# Patient Record
Sex: Male | Born: 1955
Health system: Southern US, Community
[De-identification: ages and names within clinical notes are randomized; demographics above are authoritative.]

## PROBLEM LIST (undated history)

## (undated) DIAGNOSIS — I639 Cerebral infarction, unspecified: Secondary | ICD-10-CM

## (undated) DIAGNOSIS — I4891 Unspecified atrial fibrillation: Secondary | ICD-10-CM

## (undated) DIAGNOSIS — F419 Anxiety disorder, unspecified: Secondary | ICD-10-CM

## (undated) DIAGNOSIS — E785 Hyperlipidemia, unspecified: Secondary | ICD-10-CM

## (undated) DIAGNOSIS — K219 Gastro-esophageal reflux disease without esophagitis: Secondary | ICD-10-CM

## (undated) DIAGNOSIS — N400 Enlarged prostate without lower urinary tract symptoms: Secondary | ICD-10-CM

## (undated) HISTORY — DX: Gastro-esophageal reflux disease without esophagitis: K21.9

## (undated) HISTORY — PX: RADIAL KERATOTOMY: SHX217

## (undated) HISTORY — DX: Benign prostatic hyperplasia without lower urinary tract symptoms: N40.0

## (undated) HISTORY — PX: OTHER SURGICAL HISTORY: SHX169

## (undated) HISTORY — PX: APPENDECTOMY: SHX54

## (undated) HISTORY — DX: Hyperlipidemia, unspecified: E78.5

## (undated) HISTORY — DX: Cerebral infarction, unspecified: I63.9

## (undated) HISTORY — PX: CYSTOSCOPY: SUR368

## (undated) HISTORY — DX: Unspecified atrial fibrillation: I48.91

## (undated) HISTORY — PX: COLONOSCOPY: SHX174

## (undated) HISTORY — PX: TONSILLECTOMY: SUR1361

---

## 2000-04-25 ENCOUNTER — Ambulatory Visit (HOSPITAL_BASED_OUTPATIENT_CLINIC_OR_DEPARTMENT_OTHER): Admission: RE | Admit: 2000-04-25 | Discharge: 2000-04-25 | Payer: Self-pay | Admitting: *Deleted

## 2002-05-19 HISTORY — PX: PALATE / UVULA BIOPSY / EXCISION: SUR128

## 2004-05-28 ENCOUNTER — Ambulatory Visit: Payer: Self-pay | Admitting: Family Medicine

## 2004-06-04 ENCOUNTER — Ambulatory Visit: Payer: Self-pay | Admitting: Family Medicine

## 2004-09-17 ENCOUNTER — Ambulatory Visit: Payer: Self-pay | Admitting: Family Medicine

## 2005-07-10 ENCOUNTER — Ambulatory Visit: Payer: Self-pay | Admitting: Family Medicine

## 2005-07-17 ENCOUNTER — Ambulatory Visit: Payer: Self-pay | Admitting: Family Medicine

## 2006-02-10 ENCOUNTER — Ambulatory Visit: Payer: Self-pay | Admitting: Family Medicine

## 2006-05-19 HISTORY — PX: HERNIA REPAIR: SHX51

## 2006-07-20 ENCOUNTER — Ambulatory Visit: Payer: Self-pay | Admitting: Family Medicine

## 2006-07-20 LAB — CONVERTED CEMR LAB
BUN: 16 mg/dL (ref 6–23)
Basophils Relative: 0.9 % (ref 0.0–1.0)
Bilirubin, Direct: 0.1 mg/dL (ref 0.0–0.3)
CO2: 30 meq/L (ref 19–32)
Cholesterol: 146 mg/dL (ref 0–200)
Creatinine, Ser: 0.9 mg/dL (ref 0.4–1.5)
Eosinophils Relative: 2 % (ref 0.0–5.0)
GFR calc Af Amer: 114 mL/min
Glucose, Bld: 108 mg/dL — ABNORMAL HIGH (ref 70–99)
HCT: 39.8 % (ref 39.0–52.0)
HDL: 52.7 mg/dL (ref >39.0)
Hemoglobin: 13.9 g/dL (ref 13.0–17.0)
Hgb A1c MFr Bld: 5 % (ref 4.6–6.0)
Lymphocytes Relative: 30 % (ref 12.0–46.0)
Monocytes Absolute: 0.5 10*3/uL (ref 0.2–0.7)
Monocytes Relative: 8.3 % (ref 3.0–11.0)
Neutro Abs: 3.2 10*3/uL (ref 1.4–7.7)
Neutrophils Relative %: 58.8 % (ref 43.0–77.0)
PSA: 0.55 ng/mL (ref 0.10–4.00)
Potassium: 4.5 meq/L (ref 3.5–5.1)
Sodium: 145 meq/L (ref 135–145)
TSH: 2.14 microintl units/mL (ref 0.35–5.50)
Total Bilirubin: 0.9 mg/dL (ref 0.3–1.2)
Total Protein: 6.6 g/dL (ref 6.0–8.3)
VLDL: 14 mg/dL (ref 0–40)

## 2006-07-27 ENCOUNTER — Ambulatory Visit: Payer: Self-pay | Admitting: Family Medicine

## 2006-09-01 ENCOUNTER — Ambulatory Visit: Payer: Self-pay | Admitting: Family Medicine

## 2006-09-21 ENCOUNTER — Encounter: Admission: RE | Admit: 2006-09-21 | Discharge: 2006-09-21 | Payer: Self-pay | Admitting: General Surgery

## 2007-01-11 DIAGNOSIS — N400 Enlarged prostate without lower urinary tract symptoms: Secondary | ICD-10-CM | POA: Insufficient documentation

## 2007-01-11 DIAGNOSIS — R519 Headache, unspecified: Secondary | ICD-10-CM | POA: Insufficient documentation

## 2007-01-11 DIAGNOSIS — E785 Hyperlipidemia, unspecified: Secondary | ICD-10-CM

## 2007-01-11 DIAGNOSIS — K219 Gastro-esophageal reflux disease without esophagitis: Secondary | ICD-10-CM

## 2007-01-11 DIAGNOSIS — R51 Headache: Secondary | ICD-10-CM

## 2007-10-14 ENCOUNTER — Telehealth: Payer: Self-pay | Admitting: Family Medicine

## 2007-10-19 ENCOUNTER — Ambulatory Visit: Payer: Self-pay | Admitting: Family Medicine

## 2007-10-20 LAB — CONVERTED CEMR LAB
AST: 25 units/L (ref 0–37)
Albumin: 4.4 g/dL (ref 3.5–5.2)
Alkaline Phosphatase: 43 units/L (ref 39–117)
BUN: 11 mg/dL (ref 6–23)
Basophils Relative: 0.9 % (ref 0.0–1.0)
Chloride: 101 meq/L (ref 96–112)
Creatinine, Ser: 0.9 mg/dL (ref 0.4–1.5)
Eosinophils Absolute: 0.1 10*3/uL (ref 0.0–0.7)
Eosinophils Relative: 1.8 % (ref 0.0–5.0)
GFR calc Af Amer: 114 mL/min
GFR calc non Af Amer: 94 mL/min
Glucose, Bld: 108 mg/dL — ABNORMAL HIGH (ref 70–99)
HCT: 40.8 % (ref 39.0–52.0)
MCV: 96.6 fL (ref 78.0–100.0)
Monocytes Absolute: 0.4 10*3/uL (ref 0.1–1.0)
Monocytes Relative: 9.3 % (ref 3.0–12.0)
PSA: 0.5 ng/mL (ref 0.10–4.00)
Potassium: 5.2 meq/L — ABNORMAL HIGH (ref 3.5–5.1)
RBC: 4.23 M/uL (ref 4.22–5.81)
Total CHOL/HDL Ratio: 3.1
Total Protein: 7.1 g/dL (ref 6.0–8.3)
WBC: 4.5 10*3/uL (ref 4.5–10.5)

## 2007-10-26 ENCOUNTER — Ambulatory Visit: Payer: Self-pay | Admitting: Family Medicine

## 2009-01-30 ENCOUNTER — Ambulatory Visit: Payer: Self-pay | Admitting: Family Medicine

## 2009-01-31 LAB — CONVERTED CEMR LAB
ALT: 27 units/L (ref 0–53)
Albumin: 4.6 g/dL (ref 3.5–5.2)
BUN: 15 mg/dL (ref 6–23)
Basophils Relative: 0.6 % (ref 0.0–3.0)
Bilirubin Urine: NEGATIVE
Bilirubin, Direct: 0.2 mg/dL (ref 0.0–0.3)
Calcium: 9.6 mg/dL (ref 8.4–10.5)
Cholesterol: 172 mg/dL (ref 0–200)
Eosinophils Absolute: 0.2 10*3/uL (ref 0.0–0.7)
Eosinophils Relative: 3.6 % (ref 0.0–5.0)
GFR calc non Af Amer: 93.13 mL/min (ref >60)
Glucose, Bld: 113 mg/dL — ABNORMAL HIGH (ref 70–99)
HCT: 44.4 % (ref 39.0–52.0)
Hemoglobin, Urine: NEGATIVE
Hemoglobin: 15.3 g/dL (ref 13.0–17.0)
Ketones, ur: NEGATIVE mg/dL
Leukocytes, UA: NEGATIVE
Lymphs Abs: 2 10*3/uL (ref 0.7–4.0)
MCHC: 34.4 g/dL (ref 30.0–36.0)
MCV: 99.2 fL (ref 78.0–100.0)
Monocytes Absolute: 0.4 10*3/uL (ref 0.1–1.0)
Neutro Abs: 3.3 10*3/uL (ref 1.4–7.7)
Neutrophils Relative %: 54.2 % (ref 43.0–77.0)
PSA: 0.57 ng/mL (ref 0.10–4.00)
Potassium: 4.6 meq/L (ref 3.5–5.1)
RBC: 4.48 M/uL (ref 4.22–5.81)
Total Protein: 7.5 g/dL (ref 6.0–8.3)
WBC: 5.9 10*3/uL (ref 4.5–10.5)
pH: 8 (ref 5.0–8.0)

## 2009-02-06 ENCOUNTER — Ambulatory Visit: Payer: Self-pay | Admitting: Family Medicine

## 2009-08-17 HISTORY — PX: OTHER SURGICAL HISTORY: SHX169

## 2009-11-08 ENCOUNTER — Ambulatory Visit: Payer: Self-pay | Admitting: Family Medicine

## 2009-11-09 ENCOUNTER — Telehealth: Payer: Self-pay | Admitting: Family Medicine

## 2009-11-09 LAB — CONVERTED CEMR LAB
HDL: 59.2 mg/dL (ref >39.00)
Total CHOL/HDL Ratio: 3
VLDL: 25.4 mg/dL (ref 0.0–40.0)

## 2010-02-19 ENCOUNTER — Telehealth: Payer: Self-pay | Admitting: Family Medicine

## 2010-02-26 ENCOUNTER — Ambulatory Visit: Payer: Self-pay | Admitting: Family Medicine

## 2010-02-26 DIAGNOSIS — T148XXA Other injury of unspecified body region, initial encounter: Secondary | ICD-10-CM | POA: Insufficient documentation

## 2010-03-01 ENCOUNTER — Encounter: Payer: Self-pay | Admitting: Family Medicine

## 2010-03-11 ENCOUNTER — Telehealth: Payer: Self-pay | Admitting: Family Medicine

## 2010-06-20 NOTE — Assessment & Plan Note (Signed)
Summary: left hip hematoma//lch   Vital Signs:  Patient profile:   55 year old male Weight:      171 pounds BMI:     24.98 O2 Sat:      98 % Temp:     98.2 degrees F Pulse rate:   79 / minute BP sitting:   120 / 78  (left arm)  Vitals Entered By: Pura Spice, RN (February 26, 2010 3:23 PM) CC: left hematoma left upper leg . Felll 2cwks ago . Had Xr's urgent care [place]    History of Present Illness: Here for me to check a hematoma over the left hip which resulted from a fall he took 2 weeks ago. He was mountain biking and fell over against a large boulder. He had a large bruised area which came up quickly, but was not that painful at first. He went to an Urgent Care and was told to simply observe it. Xrays there were negative for any fractures. Unfortunately he has been applying heat to the area, and he has continued with his daily aggressive exercise routine, including swimming and biking. Now the hematoma is larger than ever, and it has become painful.   Allergies (verified): No Known Drug Allergies  Past History:  Past Medical History: Reviewed history from 02/06/2009 and no changes required. GERD Hyperlipidemia Headache Benign prostatic hypertrophy Eczema Hiatal Hernia  Past Surgical History: Reviewed history from 02/06/2009 and no changes required. Right ACL Repair 1984 Appendectomy Tonsillectomy Cystoscopy per Dr. Earlene Plater Palatouvulectomy 2004 per Dr. Ezzard Standing for snoring Upper Endoscopy per Dr. Matthias Hughs colonoscopy 2000 per Dr. Matthias Hughs, repeat in 9 yrs bilateral Inguinal herniorrhaphies, laparoscopic per Dr. Abbey Chatters 5-08 radial keratotomy, bilateral repair of left detached retina at [facility] 2010  Review of Systems  The patient denies anorexia, fever, weight loss, weight gain, vision loss, decreased hearing, hoarseness, chest pain, syncope, dyspnea on exertion, peripheral edema, prolonged cough, headaches, hemoptysis, abdominal pain, melena, hematochezia, severe  indigestion/heartburn, hematuria, incontinence, genital sores, muscle weakness, suspicious skin lesions, transient blindness, difficulty walking, depression, unusual weight change, abnormal bleeding, enlarged lymph nodes, angioedema, breast masses, and testicular masses.         Flu Vaccine Consent Questions     Do you have a history of severe allergic reactions to this vaccine? no    Any prior history of allergic reactions to egg and/or gelatin? no    Do you have a sensitivity to the preservative Thimersol? no    Do you have a past history of Guillan-Barre Syndrome? no    Do you currently have an acute febrile illness? no    Have you ever had a severe reaction to latex? no    Vaccine information given and explained to patient? yes    Are you currently pregnant? no    Lot Number:[id number]   Exp Date:11/16/2010   Site Given  Left Deltoid IM Pura Spice, RN  February 26, 2010 3:25 PM   Physical Exam  General:  Well-developed,well-nourished,in no acute distress; alert,appropriate and cooperative throughout examination Abdomen:  Bowel sounds positive,abdomen soft and non-tender without masses, organomegaly or hernias noted. Msk:  there is a large fluctuant tender hematoma over the left greater trochanter. The hip has full ROM.    Impression & Recommendations:  Problem # 1:  HEMATOMA (ICD-924.9)  Orders: Orthopedic Referral (Ortho)  Complete Medication List: 1)  Vytorin 10-40 Mg Tabs (Ezetimibe-simvastatin) .... Once daily 2)  Prilosec Otc 20 Mg Tbec (Omeprazole magnesium) .... Once daily 3)  Multivitamins Tabs (Multiple vitamin) .Marland Kitchen.. 1 by mouth once daily 4)  Fish Oil Oil (Fish oil) .... Once daily 5)  Baby Aspirin 81 Mg Chew (Aspirin) .... Once daily  Other Orders: Admin 1st Vaccine (88416) Flu Vaccine 5yrs + [phone])  Patient Instructions: 1)  He is anxious to take care of this as quickly as possible, and he does not want to stop any of his workouts. I advised him to stop  taking aspirin for awhile, and to apply ice to the area instead of heat. refer to orthopedics to consider aspirating this

## 2010-06-20 NOTE — Progress Notes (Signed)
Summary: REFILL REQUEST  Phone Note Refill Request Message from:  Patient on February 19, 2010 10:20 AM  Refills Requested: Medication #1:  VYTORIN 10-40 MG  TABS once daily   Notes: [facility] - [employer].    Initial call taken by: Debbra Riding,  February 19, 2010 10:21 AM  Follow-up for Phone Call        done  Follow-up by: Pura Spice, RN,  February 19, 2010 10:48 AM    New/Updated Medications: VYTORIN 10-40 MG  TABS (EZETIMIBE-SIMVASTATIN) once daily Prescriptions: VYTORIN 10-40 MG  TABS (EZETIMIBE-SIMVASTATIN) once daily  #30 x 6   Entered by:   Pura Spice, RN   Authorized by:   Nelwyn Salisbury MD   Signed by:   Pura Spice, RN on 02/19/2010   Method used:   Electronically to        [facility]* (retail)       [address] [place], [place]  [address]       Ph: [phone]       Fax: [phone]   RxID:   [id number]

## 2010-06-20 NOTE — Progress Notes (Signed)
Summary: rtc  Phone Note Call from Patient Call back at Home Phone [phone]   Caller: Patient Call For: Nelwyn Salisbury MD Summary of Call: pt is return judi call Initial call taken by: Heron Sabins,  November 09, 2009 12:45 PM  Follow-up for Phone Call        Phone Call Completed Follow-up by: Raechel Ache, RN,  November 09, 2009 12:59 PM

## 2010-06-20 NOTE — Progress Notes (Signed)
Summary: Pt called and is needing Vytorin script sent to [facility] 90day supp  Phone Note Refill Request Call back at Home Phone [phone] Message from:  Patient on March 11, 2010 3:56 PM  Refills Requested: Medication #1:  VYTORIN 10-40 MG  TABS once daily   Dosage confirmed as above?Dosage Confirmed Pt needs scripts sent to [facility] mail pharmacy for 90days up to a year supply. Pls call script to [facility] Phone [phone] med id# [id number]       Method Requested: Telephone to [facility] Initial call taken by: Lucy Antigua,  March 11, 2010 3:57 PM  Follow-up for Phone Call        done  pt aware Follow-up by: Pura Spice, RN,  March 11, 2010 3:59 PM    Prescriptions: VYTORIN 10-40 MG  TABS (EZETIMIBE-SIMVASTATIN) once daily  #90 x 3   Entered by:   Pura Spice, RN   Authorized by:   Nelwyn Salisbury MD   Signed by:   Pura Spice, RN on 03/11/2010   Method used:   Faxed to ...       [facility] (mail-order)             , [place]         Ph: [phone]       Fax: [phone]   RxID:   [id number] VYTORIN 10-40 MG  TABS (EZETIMIBE-SIMVASTATIN) once daily  #90 x 3   Entered by:   Pura Spice, RN   Authorized by:   Nelwyn Salisbury MD   Signed by:   Pura Spice, RN on 03/11/2010   Method used:   Faxed to ...       [facility] (mail-order)             , [place]         Ph: [phone]       Fax: [phone]   RxID:   [id number]

## 2010-06-20 NOTE — Letter (Signed)
Summary: [facility]  [facility]   Imported By: Maryln Gottron 03/12/2010 13:19:05  _____________________________________________________________________  External Attachment:    Type:   Image     Comment:   External Document

## 2010-08-05 ENCOUNTER — Encounter: Payer: Self-pay | Admitting: Internal Medicine

## 2010-08-05 ENCOUNTER — Ambulatory Visit (INDEPENDENT_AMBULATORY_CARE_PROVIDER_SITE_OTHER): Payer: Managed Care, Other (non HMO) | Admitting: Internal Medicine

## 2010-08-05 DIAGNOSIS — J069 Acute upper respiratory infection, unspecified: Secondary | ICD-10-CM

## 2010-08-05 MED ORDER — CHLORPHENIRAMINE-HYDROCODONE 8-10 MG/5ML PO LQCR: 5.0000 mL | Freq: Two times a day (BID) | ORAL | Status: DC | PRN

## 2010-08-05 MED ORDER — DOXYCYCLINE HYCLATE 100 MG PO TABS: 100.0000 mg | ORAL_TABLET | Freq: Two times a day (BID) | ORAL | Status: AC

## 2010-08-05 NOTE — Assessment & Plan Note (Signed)
Begin tussionex prn and cautioned re: possible sedating effect. If sx's do not improve within 48 hours begin doxycycline. Followup if no improvement or worsening.

## 2010-08-05 NOTE — Progress Notes (Signed)
  Subjective:    Patient ID: Michael Lambert, male    DOB: Nov 13, 1955, 55 y.o.   MRN: [id number]  HPI Pt presents to clinic for evaluation of cough. Notes 8 day h/o postnasal drainage, non productive cough and malaise without fever or chills. Taking nyquil without improvement. Cough worse at night. No other exacerbating or alleviating factors. No sick exposures.  Reviewed pmh, medications and allergies    Review of Systems  Constitutional: Positive for fatigue. Negative for fever and chills.  HENT: Positive for congestion and rhinorrhea. Negative for ear pain.   Respiratory: Positive for cough. Negative for shortness of breath.        Objective:   Physical Exam  [nursing notereviewed. Constitutional: He appears well-developed and well-nourished. No distress.  HENT:  Head: Normocephalic and atraumatic.  Right Ear: Tympanic membrane, external ear and ear canal normal.  Left Ear: Tympanic membrane, external ear and ear canal normal.  Nose: Nose normal.  Mouth/Throat: Oropharynx is clear and moist and mucous membranes are normal. No oropharyngeal exudate, posterior oropharyngeal edema or posterior oropharyngeal erythema.  Eyes: Conjunctivae are normal. No scleral icterus.  Neck: Neck supple.  Cardiovascular: Normal rate, regular rhythm and normal heart sounds.  Exam reveals no gallop and no friction rub.   No murmur heard. Pulmonary/Chest: Effort normal and breath sounds normal. No respiratory distress. He has no wheezes. He has no rales.  Lymphadenopathy:    He has no cervical adenopathy.  Neurological: He is alert.  Skin: Skin is warm and dry. No rash noted. He is not diaphoretic. No erythema.          Assessment & Plan:

## 2010-10-04 NOTE — Assessment & Plan Note (Signed)
[facility] OFFICE NOTE   SANAD, FEARNOW                          MRN:          [id number]  DATE:07/27/2006                            DOB:          [birth year]    This is a 55 year old gentleman here for a complete physical  examination.  He does have a couple of issues to discuss.  First off,  for the last 3 days he has had acute onset of headaches, sinus pressure,  postnasal drainage, scratchy throat and low-grade fever.  There has been  on coughing.  Also over the past year, he has had some intermittently  itchy red spots on both of his legs.  They never seem to go away  completely.  He has been using some over-the-counter steroid cream which  does give some brief relief.  Lastly, he has been seeing Dr. Gaynelle Arabian for BPH over the past year.  He had cystoscopy and a voiding  cystourogram last week.  He is still waiting on the results of that.   Further details of his past medical history, family history, social  history, review of systems refer to our last physical note dated July 17, 2005.   ALLERGIES:  None.   CURRENT MEDICATIONS:  1. Vytorin 10/40 once a day.  2. Prilosec over-the-counter once a day.   OBJECTIVE:  Height 5 feet 10 inches, weight 174, blood pressure 104/68,  pulse 80 and regular, temperature 101.3 degrees.  IN GENERAL:  He is in no acute distress.  HEENT:  Eyes are clear, ears are clear, pharynx are clear.  NECK:  Supple, without lymphadenopathy or masses.  LUNGS:  Clear.  CARDIAC:  Regular rate and rhythm without murmurs, rubs or gallops.  Distal pulses are full.  ABDOMEN:  Soft, normal bowel sounds, nontender, no masses.  EXTREMITIES:  No cyanosis, clubbing or edema.  NEUROLOGIC:  Grossly intact.  SKIN:  Remarkable for 2 small erythematous, scaly spots on his right  thigh.   ASSESSMENT AND PLAN:  1. Complete physical.  He seems to be doing well.  He is not due for  another colonoscopy until 2009.  2. Hyperlipidemia.  Stable (he had normal fasting labs here on July 20, 2006, including an HDL of 52 and an LDL of 79).  3. Eczema, Diprolene AF cream b.i.d. as needed.  4. Benign prostatic hypertrophy.  Follow up with Dr. Earlene Plater.  5. Sinusitis.  I gave him Z-PAK.     Tera Mater. Clent Ridges, MD  Electronically Signed    SAF/[facility]  DD: 07/27/2006  DT: 07/27/2006  Job #: [id number]

## 2010-11-22 ENCOUNTER — Ambulatory Visit (INDEPENDENT_AMBULATORY_CARE_PROVIDER_SITE_OTHER): Payer: Managed Care, Other (non HMO) | Admitting: Family Medicine

## 2010-11-22 ENCOUNTER — Encounter: Payer: Self-pay | Admitting: Family Medicine

## 2010-11-22 VITALS — BP 110/78 | Temp 98.6°F | Wt 173.0 lb

## 2010-11-22 DIAGNOSIS — M542 Cervicalgia: Secondary | ICD-10-CM

## 2010-11-22 MED ORDER — CYCLOBENZAPRINE HCL 10 MG PO TABS: 10.0000 mg | ORAL_TABLET | Freq: Three times a day (TID) | ORAL | Status: DC | PRN

## 2010-11-22 MED ORDER — HYDROCODONE-ACETAMINOPHEN 7.5-500 MG PO TABS: 1.0000 | ORAL_TABLET | Freq: Four times a day (QID) | ORAL | Status: AC | PRN

## 2010-11-22 NOTE — Progress Notes (Signed)
  Subjective:    Patient ID: Michael Lambert, male    DOB: [birth year], 55 y.o.   MRN: [id number]  HPI Here for 2 weeks of stiffness and pain in the back of the neck. Heat and Motrin help a little bit. No recent trauma, but he is very active. He swims and bikes frequently. No symptoms down the arms.    Review of Systems  Constitutional: Negative.   HENT: Positive for neck pain and neck stiffness.   Respiratory: Negative.   Cardiovascular: Negative.   Neurological: Negative for headaches.       Objective:   Physical Exam  Constitutional: He is oriented to person, place, and time. He appears well-developed and well-nourished.  Neck: No thyromegaly present.       Mildly stiff in the neck with some tenderness  Lymphadenopathy:    He has no cervical adenopathy.  Neurological: He is alert and oriented to person, place, and time. He has normal reflexes. No cranial nerve deficit. He exhibits normal muscle tone.          Assessment & Plan:  Gentle stretches, heat. Use Aleve 2 tablets bid. Add Lortab or Flexeril prn .

## 2011-01-21 ENCOUNTER — Other Ambulatory Visit: Payer: Managed Care, Other (non HMO)

## 2011-01-22 ENCOUNTER — Other Ambulatory Visit (INDEPENDENT_AMBULATORY_CARE_PROVIDER_SITE_OTHER): Payer: Managed Care, Other (non HMO)

## 2011-01-22 DIAGNOSIS — Z0000 Encounter for general adult medical examination without abnormal findings: Secondary | ICD-10-CM

## 2011-01-22 LAB — HEPATIC FUNCTION PANEL
ALT: 33 U/L (ref 0–53)
AST: 24 U/L (ref 0–37)
Albumin: 4.5 g/dL (ref 3.5–5.2)
Alkaline Phosphatase: 51 U/L (ref 39–117)
Total Protein: 7.1 g/dL (ref 6.0–8.3)

## 2011-01-22 LAB — POCT URINALYSIS DIPSTICK
Bilirubin, UA: NEGATIVE
Ketones, UA: NEGATIVE
Leukocytes, UA: NEGATIVE
Protein, UA: NEGATIVE

## 2011-01-22 LAB — BASIC METABOLIC PANEL
CO2: 29 mEq/L (ref 19–32)
Calcium: 9.5 mg/dL (ref 8.4–10.5)
GFR: 90.15 mL/min (ref >60.00)
Potassium: 5 mEq/L (ref 3.5–5.1)
Sodium: 142 mEq/L (ref 135–145)

## 2011-01-22 LAB — CBC WITH DIFFERENTIAL/PLATELET
Basophils Relative: 1.3 % (ref 0.0–3.0)
Hemoglobin: 14.9 g/dL (ref 13.0–17.0)
Lymphocytes Relative: 36.7 % (ref 12.0–46.0)
Monocytes Relative: 7.9 % (ref 3.0–12.0)
Neutro Abs: 2.7 10*3/uL (ref 1.4–7.7)
RBC: 4.54 Mil/uL (ref 4.22–5.81)

## 2011-01-22 LAB — LIPID PANEL: VLDL: 17.4 mg/dL (ref 0.0–40.0)

## 2011-01-22 LAB — PSA: PSA: 0.6 ng/mL (ref 0.10–4.00)

## 2011-01-28 ENCOUNTER — Ambulatory Visit (INDEPENDENT_AMBULATORY_CARE_PROVIDER_SITE_OTHER): Payer: Managed Care, Other (non HMO) | Admitting: Family Medicine

## 2011-01-28 ENCOUNTER — Telehealth: Payer: Self-pay | Admitting: Family Medicine

## 2011-01-28 ENCOUNTER — Encounter: Payer: Self-pay | Admitting: Family Medicine

## 2011-01-28 VITALS — BP 122/74 | HR 75 | Temp 98.7°F | Ht 75.0 in | Wt 175.0 lb

## 2011-01-28 DIAGNOSIS — Z0000 Encounter for general adult medical examination without abnormal findings: Secondary | ICD-10-CM

## 2011-01-28 NOTE — Telephone Encounter (Signed)
Pt is here today for his CPE

## 2011-01-28 NOTE — Telephone Encounter (Signed)
Message copied by Baldemar Friday on Tue Jan 28, 2011  3:02 PM ------      Message from: Gershon Crane A      Created: Tue Jan 28, 2011  5:22 AM       normal

## 2011-01-28 NOTE — Progress Notes (Signed)
  Subjective:    Patient ID: Michael Lambert, male    DOB: September 10, 1955, 55 y.o.   MRN: [id number]  HPI 55 yr old male for a cpx. He feels fine and has no concerns.    Review of Systems  Constitutional: Negative.   HENT: Negative.   Eyes: Negative.   Respiratory: Negative.   Cardiovascular: Negative.   Gastrointestinal: Negative.   Genitourinary: Negative.   Musculoskeletal: Negative.   Skin: Negative.   Neurological: Negative.   Hematological: Negative.   Psychiatric/Behavioral: Negative.        Objective:   Physical Exam  Constitutional: He is oriented to person, place, and time. He appears well-developed and well-nourished. No distress.  HENT:  Head: Normocephalic and atraumatic.  Right Ear: External ear normal.  Left Ear: External ear normal.  Nose: Nose normal.  Mouth/Throat: Oropharynx is clear and moist. No oropharyngeal exudate.  Eyes: Conjunctivae and EOM are normal. Pupils are equal, round, and reactive to light. Right eye exhibits no discharge. Left eye exhibits no discharge. No scleral icterus.  Neck: Neck supple. No JVD present. No tracheal deviation present. No thyromegaly present.  Cardiovascular: Normal rate, regular rhythm, normal heart sounds and intact distal pulses.  Exam reveals no gallop and no friction rub.   No murmur heard.      EKG normal   Pulmonary/Chest: Effort normal and breath sounds normal. No respiratory distress. He has no wheezes. He has no rales. He exhibits no tenderness.  Abdominal: Soft. Bowel sounds are normal. He exhibits no distension and no mass. There is no tenderness. There is no rebound and no guarding.  Genitourinary: Rectum normal, prostate normal and penis normal. Guaiac negative stool. No penile tenderness.  Musculoskeletal: Normal range of motion. He exhibits no edema and no tenderness.  Lymphadenopathy:    He has no cervical adenopathy.  Neurological: He is alert and oriented to person, place, and time. He has normal reflexes. No  cranial nerve deficit. He exhibits normal muscle tone. Coordination normal.  Skin: Skin is warm and dry. No rash noted. He is not diaphoretic. No erythema. No pallor.  Psychiatric: He has a normal mood and affect. His behavior is normal. Judgment and thought content normal.          Assessment & Plan:  Well exam

## 2011-01-29 ENCOUNTER — Telehealth: Payer: Self-pay | Admitting: Family Medicine

## 2011-01-29 NOTE — Telephone Encounter (Signed)
fyi-------last colonoscopy was done in 09-09-2002 by Dr Mardee Postin. Patient just wanted to let Dr Clent Ridges know.

## 2011-01-30 NOTE — Telephone Encounter (Signed)
noted 

## 2011-02-27 ENCOUNTER — Other Ambulatory Visit: Payer: Self-pay | Admitting: Family Medicine

## 2011-05-12 ENCOUNTER — Other Ambulatory Visit: Payer: Self-pay

## 2011-05-12 ENCOUNTER — Emergency Department (HOSPITAL_COMMUNITY): Payer: Managed Care, Other (non HMO)

## 2011-05-12 ENCOUNTER — Encounter (HOSPITAL_COMMUNITY): Payer: Self-pay | Admitting: *Deleted

## 2011-05-12 ENCOUNTER — Emergency Department (HOSPITAL_COMMUNITY)
Admission: EM | Admit: 2011-05-12 | Discharge: 2011-05-12 | Disposition: A | Discharge status: Home / Self Care | Payer: Managed Care, Other (non HMO) | Attending: Emergency Medicine | Admitting: Emergency Medicine

## 2011-05-12 DIAGNOSIS — M62838 Other muscle spasm: Secondary | ICD-10-CM | POA: Insufficient documentation

## 2011-05-12 DIAGNOSIS — IMO0002 Reserved for concepts with insufficient information to code with codable children: Secondary | ICD-10-CM | POA: Insufficient documentation

## 2011-05-12 DIAGNOSIS — S01309A Unspecified open wound of unspecified ear, initial encounter: Secondary | ICD-10-CM | POA: Insufficient documentation

## 2011-05-12 DIAGNOSIS — W19XXXA Unspecified fall, initial encounter: Secondary | ICD-10-CM | POA: Insufficient documentation

## 2011-05-12 DIAGNOSIS — R4020 Unspecified coma: Secondary | ICD-10-CM

## 2011-05-12 DIAGNOSIS — M549 Dorsalgia, unspecified: Secondary | ICD-10-CM | POA: Insufficient documentation

## 2011-05-12 DIAGNOSIS — R059 Cough, unspecified: Secondary | ICD-10-CM | POA: Insufficient documentation

## 2011-05-12 DIAGNOSIS — R55 Syncope and collapse: Secondary | ICD-10-CM | POA: Insufficient documentation

## 2011-05-12 DIAGNOSIS — R404 Transient alteration of awareness: Secondary | ICD-10-CM | POA: Insufficient documentation

## 2011-05-12 DIAGNOSIS — R739 Hyperglycemia, unspecified: Secondary | ICD-10-CM

## 2011-05-12 DIAGNOSIS — R05 Cough: Secondary | ICD-10-CM | POA: Insufficient documentation

## 2011-05-12 HISTORY — DX: Anxiety disorder, unspecified: F41.9

## 2011-05-12 LAB — COMPREHENSIVE METABOLIC PANEL
ALT: 29 U/L (ref 0–53)
AST: 27 U/L (ref 0–37)
Albumin: 4 g/dL (ref 3.5–5.2)
Alkaline Phosphatase: 74 U/L (ref 39–117)
Glucose, Bld: 140 mg/dL — ABNORMAL HIGH (ref 70–99)
Potassium: 4 mEq/L (ref 3.5–5.1)
Sodium: 137 mEq/L (ref 135–145)
Total Protein: 7.3 g/dL (ref 6.0–8.3)

## 2011-05-12 LAB — DIFFERENTIAL
Basophils Absolute: 0.1 10*3/uL (ref 0.0–0.1)
Basophils Relative: 1 % (ref 0–1)
Eosinophils Absolute: 0.6 10*3/uL (ref 0.0–0.7)
Lymphs Abs: 5 10*3/uL — ABNORMAL HIGH (ref 0.7–4.0)
Neutrophils Relative %: 40 % — ABNORMAL LOW (ref 43–77)

## 2011-05-12 LAB — URINALYSIS, ROUTINE W REFLEX MICROSCOPIC
Bilirubin Urine: NEGATIVE
Glucose, UA: NEGATIVE mg/dL
Hgb urine dipstick: NEGATIVE
Ketones, ur: NEGATIVE mg/dL
pH: 6.5 (ref 5.0–8.0)

## 2011-05-12 LAB — CBC
MCH: 32.4 pg (ref 26.0–34.0)
Platelets: 236 10*3/uL (ref 150–400)
RBC: 4.54 MIL/uL (ref 4.22–5.81)

## 2011-05-12 MED ORDER — IBUPROFEN 600 MG PO TABS: 600.0000 mg | ORAL_TABLET | Freq: Four times a day (QID) | ORAL | Status: AC | PRN

## 2011-05-12 MED ORDER — DIAZEPAM 5 MG PO TABS: 5.0000 mg | ORAL_TABLET | Freq: Four times a day (QID) | ORAL | Status: DC | PRN

## 2011-05-12 NOTE — ED Provider Notes (Signed)
History     CSN: [id number]  Arrival date & time 05/12/11  0300   First MD Initiated Contact with Patient 05/12/11 [phone]      Chief Complaint  Patient presents with  . Flank Pain    left    (Consider location/radiation/quality/duration/timing/severity/associated sxs/prior treatment) HPI Comments: Fall last night, self medicated with musinex, Hydrocodone & unisom. Pt has a night time urination issue, went to use restroom and woke up on  Bathroom floor. LOC & hit head, unaware of how long out for. In addition pt c/o left sided deep muscular pain from picking up a heavy box yesterday afternoon. Pt states that since arrival to hospital he feels much better, but reports muscular spasm type pain with movement.  Patient denies any change in vision, nausea, vomiting, lightheadedness, dizziness, hematuria.  Patient patient states she's been fighting a cold for the last week.  He states he feels it is resolving and that his symptoms have included a dry cough, rhinorrhea, sore throat, and muscle aches.  Patient is a 55 y.o. male presenting with fall.  Fall The accident occurred 3 to 5 hours ago. Incident: in bathroom; pt not sure  He fell from a height of 3 to 5 ft. He landed on a hard floor. There was no blood loss. Point of impact: unsure, unwittnessed. The pain is at a severity of 7/10. The pain is mild. He was ambulatory at the scene. There was no entrapment after the fall. There was no drug use involved in the accident. There was no alcohol use involved in the accident. Associated symptoms include loss of consciousness. Pertinent negatives include no visual change, no fever, no numbness, no abdominal pain, no bowel incontinence, no vomiting, no hematuria, no headaches and no hearing loss. He has tried nothing for the symptoms. The treatment provided no relief.    Past Medical History  Diagnosis Date  . GERD (gastroesophageal reflux disease)   . Hyperlipidemia   . Benign prostatic hypertrophy     . Migraine   . Anxiety     Past Surgical History  Procedure Date  . Appendectomy   . Tonsillectomy   . Cystoscopy     per Dr. Earlene Plater  . Radial keratotomy     sees Dr. Ladoris Gene at [facility] , bilateral  . Repair detached retina   . Hernia repair 2008    bilateral inguinal, per Dr. Abbey Chatters  . Palate / uvula biopsy / excision 2004    per Dr. Ezzard Standing, for snoring   . Colonoscopy 2000    per Dr. Matthias Hughs, repeat 10 yrs     Family History  Problem Relation Age of Onset  . Multiple sclerosis      family hx  . Hyperlipidemia      family hx  . Hypertension      family hx    History  Substance Use Topics  . Smoking status: Former Games developer  . Smokeless tobacco: Never Used  . Alcohol Use: Yes      Review of Systems  Constitutional: Negative for fever and chills.  HENT: Positive for congestion, sore throat and rhinorrhea. Negative for ear pain, neck pain and neck stiffness.   Eyes: Negative for photophobia, pain and visual disturbance.  Respiratory: Positive for cough. Negative for apnea, choking, chest tightness, shortness of breath, wheezing and stridor.   Gastrointestinal: Negative for vomiting, abdominal pain and bowel incontinence.  Genitourinary: Positive for difficulty urinating (Chronic BPH; symptoms worsening after began zoloft ). Negative for  dysuria, urgency, hematuria and testicular pain.  Musculoskeletal: Positive for back pain (left lower back pain since yesterday ). Negative for myalgias, joint swelling, arthralgias and gait problem.  Neurological: Positive for loss of consciousness and syncope (episode last night around 2:00 am). Negative for dizziness, tremors, seizures, facial asymmetry, speech difficulty, weakness, light-headedness, numbness and headaches.  Psychiatric/Behavioral: Negative for confusion and decreased concentration.  All other systems reviewed and are negative.    Allergies  Review of patient's allergies indicates no known  allergies.  Home Medications   Current Outpatient Rx  Name Route Sig Dispense Refill  . ASPIRIN 81 MG PO TABS Oral Take 81 mg by mouth daily.      . CO Q 10 100 MG PO CAPS Oral Take by mouth 2 (two) times daily.      . CYCLOSPORINE 0.05 % OP EMUL Both Eyes Place 2 drops into both eyes 2 (two) times daily.      . OMEGA-3 FATTY ACIDS 1000 MG PO CAPS Oral Take 2 g by mouth daily.      Marland Kitchen ONE-DAILY MULTI VITAMINS PO TABS Oral Take 1 tablet by mouth daily.      Marland Kitchen OMEPRAZOLE 20 MG PO CPDR Oral Take 20 mg by mouth daily.      . SERTRALINE HCL 50 MG PO TABS Oral Take 25 mg by mouth daily.      Marland Kitchen VYTORIN 10-40 MG PO TABS  TAKE 1 TABLET DAILY 90 tablet 2    BP 133/70  Pulse 60  Temp(Src) 98.8 F (37.1 C) (Oral)  Resp 15  SpO2 99%  Physical Exam  Nursing note and vitals reviewed. Constitutional: He is oriented to person, place, and time. He appears well-developed and well-nourished. No distress.  HENT:  Head: Normocephalic. Head is without raccoon's eyes and without Battle's sign.    Eyes: Conjunctivae and EOM are normal. Pupils are equal, round, and reactive to light.  Fundoscopic exam:      The right eye shows no hemorrhage.       The left eye shows no hemorrhage.       Normal appearance  Neck: Normal range of motion.  Cardiovascular: Normal rate, regular rhythm, S1 normal, S2 normal, normal heart sounds, intact distal pulses and normal pulses.  Exam reveals no S3 and no S4.   No murmur heard. Pulmonary/Chest: Effort normal and breath sounds normal. No accessory muscle usage. No apnea and not tachypneic. No respiratory distress.  Abdominal: There is no tenderness. There is no CVA tenderness.  Musculoskeletal: Normal range of motion.       Left shoulder: Normal.       Left elbow: Normal.       Left wrist: Normal.       Left hip: Normal.       No CVA tenderness, bony tenderness or decreased range of motion of left hip left shoulder left elbow or left wrist.  Patient does complain of  muscular type spasms during physical exam when asked to move in different positions however this pain is not palpable.  Neurological: He is alert and oriented to person, place, and time. He has normal strength and normal reflexes. He displays no tremor. No cranial nerve deficit or sensory deficit. He displays a negative Romberg sign. Coordination and gait normal. GCS eye subscore is 4. GCS verbal subscore is 5. GCS motor subscore is 6.       5/5 and equal upper and lower extremity strength.  No past pointing.  No  pronator drift.  No nystagmus.   Skin: Skin is warm and dry. No rash noted.  Psychiatric: He has a normal mood and affect. His behavior is normal.    ED Course  Procedures (including critical care time)  Labs Reviewed  CBC - Abnormal; Notable for the following:    WBC 11.4 (*)    All other components within normal limits  DIFFERENTIAL - Abnormal; Notable for the following:    Neutrophils Relative 40 (*)    Lymphs Abs 5.0 (*)    Monocytes Absolute 1.1 (*)    All other components within normal limits  COMPREHENSIVE METABOLIC PANEL - Abnormal; Notable for the following:    CO2 18 (*)    Glucose, Bld 140 (*)    GFR calc non Af Amer 90 (*)    All other components within normal limits  POCT I-STAT TROPONIN I  URINALYSIS, ROUTINE W REFLEX MICROSCOPIC  I-STAT TROPONIN I   Ct Head Wo Contrast  05/12/2011  *RADIOLOGY REPORT*  Clinical Data: Injury to the head, with left ear laceration and dizziness.  CT HEAD WITHOUT CONTRAST  Technique:  Contiguous axial images were obtained from the base of the skull through the vertex without contrast.  Comparison: None.  Findings: There is no evidence of acute infarction, mass lesion, or intra- or extra-axial hemorrhage on CT.  The posterior fossa, including the cerebellum, brainstem and fourth ventricle, is within normal limits.  The third and lateral ventricles, and basal ganglia are unremarkable in appearance.  The cerebral hemispheres are  symmetric in appearance, with normal gray- white differentiation.  No mass effect or midline shift is seen.  There is no evidence of fracture; visualized osseous structures are unremarkable in appearance.  The orbits are within normal limits; the patient is status post left-sided scleral banding.  The paranasal sinuses and mastoid air cells are well-aerated.  No significant soft tissue abnormalities are seen.  IMPRESSION: No evidence of traumatic intracranial injury or fracture.  Original Report Authenticated By: Tonia Ghent, M.D.    Date: 05/12/2011  Rate: 55  Rhythm: normal sinus rhythm  QRS Axis: normal  Intervals: normal  ST/T Wave abnormalities: normal  Conduction Disutrbances:none  Narrative Interpretation:   Old EKG Reviewed: No significant changes noted     No diagnosis found.  Patient likely had a vasovagal syncope while using the restroom last night.  He is currently hemodynamically stable has no complaints, had no neurological findings on physical exam, normal EKG and normal CT scan of the head.  Comfortable discharging patient with instructions of possible post concussive syndrome and with directions to followup with his primary care doctor.  In addition patient likely has a muscle spasm injury from lifting heavy box that he'll be discharged with Valium and Motrin for her.  Patient are it he has hydrocodone at home.  He is been advised not to drink alcohol or operate heavy machinery while using these drugs.  We have discussed the patient's elevated white blood cell count likely due towards a viral-type infection that he's had for the last week.  Patient will not be given any antibiotics and is currently asymptomatic with lungs clear to auscultation bilaterally.  The above plan to discharge patient has been discussed with Dr. Karma Ganja who agrees with my plan.  MDM  Muscle spasm/back injury LOC; Vasovagal syncope         [place], [place] 05/12/11 0743  Jaci Carrel,  [place] 05/12/11 [phone]

## 2011-05-12 NOTE — ED Notes (Addendum)
Pt told his wife this evening that he needed to come to [facility] in reference to left sided pain that is concentrated over his left hip area.  Pt states he is dizzy and appears to have a small cut over his left ear that he does not have a recollection of sustaining.  The pt also has small abrasions to the left side of his neck that he has no recollection of getting.  Pt recalls taking a hydrocodone and states that he just began taking zoloft several weeks ago.  Pt also complains of a dry mouth and difficulty urinating.

## 2011-05-12 NOTE — ED Provider Notes (Signed)
Medical screening examination/treatment/procedure(s) were performed by non-physician practitioner and as supervising physician I was immediately available for consultation/collaboration.  Martha K Linker, MD 05/12/11 0818 

## 2011-06-06 ENCOUNTER — Encounter: Payer: Self-pay | Admitting: Family Medicine

## 2011-06-06 ENCOUNTER — Ambulatory Visit (INDEPENDENT_AMBULATORY_CARE_PROVIDER_SITE_OTHER): Payer: Managed Care, Other (non HMO) | Admitting: Family Medicine

## 2011-06-06 VITALS — BP 116/76 | HR 66 | Temp 98.6°F | Wt 175.0 lb

## 2011-06-06 DIAGNOSIS — R7309 Other abnormal glucose: Secondary | ICD-10-CM

## 2011-06-06 DIAGNOSIS — R55 Syncope and collapse: Secondary | ICD-10-CM

## 2011-06-06 DIAGNOSIS — R739 Hyperglycemia, unspecified: Secondary | ICD-10-CM

## 2011-06-06 LAB — BASIC METABOLIC PANEL
BUN: 17 mg/dL (ref 6–23)
CO2: 27 mEq/L (ref 19–32)
Glucose, Bld: 87 mg/dL (ref 70–99)
Potassium: 3.9 mEq/L (ref 3.5–5.1)
Sodium: 139 mEq/L (ref 135–145)

## 2011-06-06 NOTE — Progress Notes (Signed)
  Subjective:    Patient ID: Michael Lambert, male    DOB: 05-27-1955, 56 y.o.   MRN: [id number]  HPI Here to follow up an episode which occurred on 05-12-11 when he passed out and fell at home in the bathroom. This was after he had urinated and passed a BM. That night he was also fighting a viral URI and had taken a sleeping pill OTC and some hydrocodone cough syrup. He struck the back of his head when he feel, but apparently the LOC was brief. When he regained consciousness, he felt fine. He was seen in the ER with a normal exam and a normal head CT scan. His labs then were remarkable for a random glucose of 140. He was told to follow up with [place] here for that. He has had elevated fasting glucoses for the past few years, but this was normal at his cpx here last fall. He has felt fine since this incident with no neurologic issues.    Review of Systems  Constitutional: Negative.   HENT: Negative.   Respiratory: Negative.   Cardiovascular: Negative.   Neurological: Negative.        Objective:   Physical Exam  Constitutional: He is oriented to person, place, and time. He appears well-developed and well-nourished.  HENT:  Head: Normocephalic and atraumatic.  Eyes: Pupils are equal, round, and reactive to light.  Neck: No thyromegaly present.  Cardiovascular: Normal rate, regular rhythm, normal heart sounds and intact distal pulses.   Pulmonary/Chest: Effort normal and breath sounds normal.  Lymphadenopathy:    He has no cervical adenopathy.  Neurological: He is alert and oriented to person, place, and time. No cranial nerve deficit. He exhibits normal muscle tone. Coordination normal.          Assessment & Plan:  He seems to have recovered well from the syncopal episode. We will check a BMET and A1c today to follow his glucose status.

## 2011-06-09 NOTE — Progress Notes (Signed)
Quick Note:  Left voice message ______ 

## 2011-07-30 ENCOUNTER — Ambulatory Visit (INDEPENDENT_AMBULATORY_CARE_PROVIDER_SITE_OTHER): Payer: Managed Care, Other (non HMO) | Admitting: Family Medicine

## 2011-07-30 ENCOUNTER — Ambulatory Visit: Payer: Managed Care, Other (non HMO) | Admitting: Family Medicine

## 2011-07-30 ENCOUNTER — Encounter: Payer: Self-pay | Admitting: Family Medicine

## 2011-07-30 VITALS — BP 132/94 | HR 67 | Temp 98.6°F | Wt 171.0 lb

## 2011-07-30 DIAGNOSIS — R002 Palpitations: Secondary | ICD-10-CM

## 2011-07-30 DIAGNOSIS — IMO0001 Reserved for inherently not codable concepts without codable children: Secondary | ICD-10-CM

## 2011-07-30 DIAGNOSIS — R030 Elevated blood-pressure reading, without diagnosis of hypertension: Secondary | ICD-10-CM

## 2011-07-30 NOTE — Progress Notes (Signed)
  Subjective:    Patient ID: Michael Lambert, male    DOB: 1956/03/22, 56 y.o.   MRN: [id number]  HPI Here to discuss several issues. He has been seeing Dr. Madaline Guthrie, a psychiatrist, for anxiety and depression, and she started him on Zoloft about 8 weeks ago. This has been helpful for his moods, but he thinks it is causing side effects. He has some sexual dysfunction now and some sedation. He also has noticed some mild elevations in his BP at work and at home. He gets readings of 140-160 systolic and 90s diastolic. He has noticed some occasional flutterings in his chest while lying in bed at night also. No dizziness or chest pains or SOB. He is able to exercise as usual.    Review of Systems  Constitutional: Negative.   Respiratory: Negative.   Cardiovascular: Positive for palpitations. Negative for chest pain and leg swelling.  Neurological: Negative.        Objective:   Physical Exam  Constitutional: He is oriented to person, place, and time. He appears well-developed and well-nourished.  Neck: Neck supple. No thyromegaly present.  Cardiovascular: Normal rate, regular rhythm, normal heart sounds and intact distal pulses.  Exam reveals no gallop and no friction rub.   No murmur heard. Pulmonary/Chest: Effort normal and breath sounds normal.  Lymphadenopathy:    He has no cervical adenopathy.  Neurological: He is alert and oriented to person, place, and time.          Assessment & Plan:  I agree that he is having side effects form the Zoloft. I do not. think he has true HTN, so we will observe this only for now. I advised him to follow up with Dr. Madaline Guthrie and to ask if he can try an alternative med. He will follow up with me if the BP remains high

## 2011-09-15 ENCOUNTER — Ambulatory Visit: Payer: Managed Care, Other (non HMO) | Admitting: Family Medicine

## 2011-09-16 ENCOUNTER — Ambulatory Visit (INDEPENDENT_AMBULATORY_CARE_PROVIDER_SITE_OTHER): Payer: Managed Care, Other (non HMO) | Admitting: Family Medicine

## 2011-09-16 ENCOUNTER — Encounter: Payer: Self-pay | Admitting: Family Medicine

## 2011-09-16 VITALS — BP 126/80 | HR 72 | Temp 98.8°F | Wt 166.0 lb

## 2011-09-16 DIAGNOSIS — F411 Generalized anxiety disorder: Secondary | ICD-10-CM

## 2011-09-16 DIAGNOSIS — F419 Anxiety disorder, unspecified: Secondary | ICD-10-CM

## 2011-09-16 DIAGNOSIS — T7840XA Allergy, unspecified, initial encounter: Secondary | ICD-10-CM

## 2011-09-16 DIAGNOSIS — L989 Disorder of the skin and subcutaneous tissue, unspecified: Secondary | ICD-10-CM

## 2011-09-16 MED ORDER — ESCITALOPRAM OXALATE 10 MG PO TABS: 10.0000 mg | ORAL_TABLET | Freq: Every day | ORAL | Status: DC

## 2011-09-16 NOTE — Progress Notes (Signed)
  Subjective:    Patient ID: Michael Lambert, male    DOB: [birth year], 56 y.o.   MRN: [id number]  HPI Here for several issues. First he asks if he can see [place] for his anxiety. He had been seeing Dr. Madaline Guthrie for this, but he is not comfortable with this relationship, so he wants to see [place] instead. He had been on Zoloft for several months, and he had been very pleased with its beneficial effects. He was more relaxed and happier. However it had some side effects including raising his BP and causing diarrhea. He started having loose stools after every meal one month ago, so he stopped Zoloft one week ago. Now his BP is back to normal, and the diarrhea is easing up a bit. He is feeling the old anxiety, melancholy, and irritability coming back however. Also he asks about a possible food allergy. He has had crab meat and shrimp many times before, but he has never had oysters. He ordered egg rolls from a restaurant last week, and about one hour later he began to have itching all over and his lower lip swelled. He took some Benadryl and this resolved. He found out that the egg rolls contained crab meat and oyster juice. Lastly he had a spot come up on his forehead about 2 weeks ago and asks me to check it.   Review of Systems  Constitutional: Negative.   Respiratory: Negative.   Cardiovascular: Negative.   Gastrointestinal: Positive for diarrhea. Negative for nausea, vomiting, abdominal pain, constipation and blood in stool.  Psychiatric/Behavioral: Positive for dysphoric mood. Negative for confusion, decreased concentration and agitation. The patient is nervous/anxious.        Objective:   Physical Exam  Constitutional: He appears well-developed and well-nourished.  Abdominal: Soft. Bowel sounds are normal. He exhibits no distension and no mass. There is no tenderness. There is no rebound and no guarding.  Skin:       There is a 0.8 cm raised flat lesion on the forehead  Psychiatric: He has a normal mood and  affect. His behavior is normal. Thought content normal.          Assessment & Plan:  I agree that the diarrhea is probably a side effect of the Zoloft. This should resolve in the next week or two now that he is off this med. Try Lexapro instead. His BP is normal. I think he may be allergic to oysters, so he will avoid these. Recheck in 3-4 weeks. He will see his dermatologist about the skin lesion.

## 2011-09-24 ENCOUNTER — Telehealth: Payer: Self-pay | Admitting: Family Medicine

## 2011-09-24 NOTE — Telephone Encounter (Signed)
Patient called stating that the dosage Lexapro that he is taking is having a minimal effect and is requesting to increase dosage. Please advise.

## 2011-09-25 NOTE — Telephone Encounter (Signed)
Increase the Lexapro to 20 mg a day. Call in one year supply

## 2011-09-26 MED ORDER — ESCITALOPRAM OXALATE 20 MG PO TABS: 20.0000 mg | ORAL_TABLET | Freq: Every day | ORAL | Status: DC

## 2011-09-26 NOTE — Telephone Encounter (Signed)
Addended by: Aniceto Boss A on: 09/26/2011 10:36 AM   Modules accepted: Orders

## 2011-09-26 NOTE — Telephone Encounter (Signed)
I sent script e-scribe and left voice message for pt 

## 2011-10-22 ENCOUNTER — Ambulatory Visit (INDEPENDENT_AMBULATORY_CARE_PROVIDER_SITE_OTHER): Payer: Managed Care, Other (non HMO) | Admitting: Family Medicine

## 2011-10-22 ENCOUNTER — Encounter: Payer: Self-pay | Admitting: Family Medicine

## 2011-10-22 VITALS — BP 126/80 | HR 61 | Temp 99.1°F | Wt 169.0 lb

## 2011-10-22 DIAGNOSIS — M25519 Pain in unspecified shoulder: Secondary | ICD-10-CM

## 2011-10-22 DIAGNOSIS — F329 Major depressive disorder, single episode, unspecified: Secondary | ICD-10-CM

## 2011-10-22 DIAGNOSIS — F411 Generalized anxiety disorder: Secondary | ICD-10-CM

## 2011-10-22 DIAGNOSIS — F419 Anxiety disorder, unspecified: Secondary | ICD-10-CM | POA: Insufficient documentation

## 2011-10-22 MED ORDER — BUPROPION HCL ER (XL) 150 MG PO TB24: 150.0000 mg | ORAL_TABLET | Freq: Every day | ORAL | Status: DC

## 2011-10-22 NOTE — Progress Notes (Signed)
  Subjective:    Patient ID: Michael Lambert, male    DOB: [birth year], 56 y.o.   MRN: [id number]  HPI Here to follow up on Lexapro. He has been on this for about 5 weeks. We started on 10 mg a day and then went up to 20mg  a day. This is helping his anxiety and depression a little, but not as much as Zoloft did. He has tolerated this well, however, with no sexual side effects or loose stools. He wants to try something totally different. Also he asks to look at his right shoulder which has been painful off and on for several months. No hx of trauma. He feels pain in the anterior and lateral shoulder which radiates down the upper arm when he does things above his head. No numbness in the arm or hand. Advil helps.    Review of Systems  Constitutional: Negative.   Musculoskeletal: Positive for arthralgias.  Psychiatric/Behavioral: Positive for dysphoric mood. Negative for confusion, sleep disturbance, decreased concentration and agitation. The patient is nervous/anxious.        Objective:   Physical Exam  Constitutional: He appears well-developed and well-nourished.  Musculoskeletal:       Slightly tender in the right anterior shoulder with full ROM. Internal and external rotation is a little painful          Assessment & Plan:  He seems to have some impingement in the right shoulder. For now he will try rest and Advil prn. Follow up prn. As for the anxiety, he will stop Lexapro and try Wellbutrin. Recheck in 3 weeks

## 2011-11-01 ENCOUNTER — Other Ambulatory Visit: Payer: Self-pay | Admitting: Family Medicine

## 2011-11-12 ENCOUNTER — Telehealth: Payer: Self-pay | Admitting: Family Medicine

## 2011-11-12 NOTE — Telephone Encounter (Signed)
Caller: Tavious/Patient; Phone Number: [phone]; Message from caller:  F/U from last OV: Lexapro was changed to Welbutrin 150mg .  Asking instead of increasing to 300mg , would you add a low dose of Zoloft 25mg ?  This would be Welbutrin 150mg  and Zoloft 25mg  daily. Also [place] a physical therapist at the club and she recognized the sx he is having in his right shoulder and arm.  Has continued to have pain.  She suggested an appointment at [facility] for PT.  Is this ok with you?  Or do you recommend someone else? He thinks that this appointment will require a referral from you.

## 2011-11-12 NOTE — Telephone Encounter (Signed)
Can you call pt and offer a visit? Please see below note.

## 2011-11-12 NOTE — Telephone Encounter (Signed)
At our last visit I asked to see him back to follow up on these issues. I still would like see him again before making these changes

## 2011-11-13 ENCOUNTER — Encounter: Payer: Self-pay | Admitting: Family Medicine

## 2011-11-13 ENCOUNTER — Ambulatory Visit (INDEPENDENT_AMBULATORY_CARE_PROVIDER_SITE_OTHER): Payer: Managed Care, Other (non HMO) | Admitting: Family Medicine

## 2011-11-13 VITALS — BP 122/80 | HR 83 | Temp 99.0°F | Wt 172.0 lb

## 2011-11-13 DIAGNOSIS — M25511 Pain in right shoulder: Secondary | ICD-10-CM

## 2011-11-13 DIAGNOSIS — F418 Other specified anxiety disorders: Secondary | ICD-10-CM

## 2011-11-13 DIAGNOSIS — F341 Dysthymic disorder: Secondary | ICD-10-CM

## 2011-11-13 DIAGNOSIS — M25519 Pain in unspecified shoulder: Secondary | ICD-10-CM

## 2011-11-13 MED ORDER — SERTRALINE HCL 50 MG PO TABS: 25.0000 mg | ORAL_TABLET | Freq: Every day | ORAL | Status: DC

## 2011-11-13 NOTE — Progress Notes (Signed)
  Subjective:    Patient ID: Michael Lambert, male    DOB: 11-06-1955, 56 y.o.   MRN: [id number]  HPI Here to follow up anxiety and depression, as well as right shoulder pain. The shoulder is still stiff and sore, but he does not want to give up swimming. He asks to see a friend of his who a physical therapist for some PT. Also the Wellbutrin he is taking helps his depression and gives him some energy, but it is not helping the anxiety at all. He asks if he could go back on a small dose of Zoloft as well.    Review of Systems  Constitutional: Negative.   Musculoskeletal: Positive for arthralgias.  Psychiatric/Behavioral: Positive for dysphoric mood and decreased concentration. The patient is nervous/anxious.        Objective:   Physical Exam  Constitutional: He appears well-developed and well-nourished.  Psychiatric: He has a normal mood and affect. His behavior is normal. Thought content normal.          Assessment & Plan:  Add Zoloft 25 mg a day to the Wellbutrin XL. Refer to PT

## 2011-12-19 ENCOUNTER — Telehealth: Payer: Self-pay | Admitting: Family Medicine

## 2011-12-19 NOTE — Telephone Encounter (Signed)
I spoke with pt and now he wants to know if he should increase the Wellbutrin, since he will not be taking the Zoloft for now?

## 2011-12-19 NOTE — Telephone Encounter (Signed)
Caller: Alfonza/Patient; Phone Number: [phone]; Message from caller: Pt calling today 12/19/11 regarding has been seeing Dr.  Clent Ridges for anxiety/depression and they have been experimenting with combinations of medications.  At last visit, MD prescribed Zoloft and Wellbutrin.  Pt said he is having some side effects from the Zoloft (diarrhea) wants to know if something can be changed or does MD want to see him again.  PLEASE CALL PT BACK AT [phone] TO ADVISE.  Pharmacy is [facility] [employer] [facility]).

## 2011-12-19 NOTE — Telephone Encounter (Signed)
Stop the Zoloft. Lets go with the Wellbutrin alone for a few weeks to see how he feels

## 2011-12-22 NOTE — Telephone Encounter (Signed)
I left voice message with below information. 

## 2011-12-22 NOTE — Telephone Encounter (Signed)
I agree. Increase the Wellbutrin to 2 capsules a day (a total of 300 mg) for 2 weeks and let me know how this goes

## 2012-01-16 ENCOUNTER — Ambulatory Visit (INDEPENDENT_AMBULATORY_CARE_PROVIDER_SITE_OTHER): Payer: Managed Care, Other (non HMO) | Admitting: Family Medicine

## 2012-01-16 ENCOUNTER — Telehealth: Payer: Self-pay | Admitting: Family Medicine

## 2012-01-16 ENCOUNTER — Encounter: Payer: Self-pay | Admitting: Family Medicine

## 2012-01-16 VITALS — BP 134/86 | HR 79 | Temp 98.9°F | Wt 173.0 lb

## 2012-01-16 DIAGNOSIS — F418 Other specified anxiety disorders: Secondary | ICD-10-CM

## 2012-01-16 DIAGNOSIS — F341 Dysthymic disorder: Secondary | ICD-10-CM

## 2012-01-16 MED ORDER — BUPROPION HCL ER (XL) 300 MG PO TB24: 300.0000 mg | ORAL_TABLET | Freq: Every day | ORAL | Status: DC

## 2012-01-16 MED ORDER — VENLAFAXINE HCL 75 MG PO TABS: 75.0000 mg | ORAL_TABLET | Freq: Every day | ORAL | Status: DC

## 2012-01-16 NOTE — Telephone Encounter (Signed)
Pt called and went to [facility] on [address] to pick up scripts for buPROPion (WELLBUTRIN XL) 300 MG 24 hr tablet and venlafaxine (EFFEXOR) 75 MG tablet and nothing has been sent in yet. Do not send to [facility].

## 2012-01-16 NOTE — Telephone Encounter (Signed)
I called in both scripts. 

## 2012-01-16 NOTE — Progress Notes (Signed)
  Subjective:    Patient ID: Michael Lambert, male    DOB: 04-01-1956, 56 y.o.   MRN: [id number]  HPI Here to follow up on depression and anxiety. He has been doubling up on Wellbutrin to take a total of 300 mg a day, also taking Zoloft. His diarrhea has subsided. The depression is much better but is still anxious. He does not want to increase the dose of Zoloft any further because he is afraid the diarrhea ill return.    Review of Systems  Constitutional: Negative.   Psychiatric/Behavioral: Positive for dysphoric mood. Negative for hallucinations, behavioral problems, confusion, decreased concentration and agitation. The patient is nervous/anxious.        Objective:   Physical Exam  Constitutional: He appears well-developed and well-nourished.  Psychiatric: He has a normal mood and affect. His behavior is normal. Thought content normal.          Assessment & Plan:  Stay on Wellbutrin XL 300 mg a day but switch from Zoloft to Effexor XR. Recheck in 3 weeks

## 2012-01-23 ENCOUNTER — Telehealth: Payer: Self-pay | Admitting: Family Medicine

## 2012-01-23 NOTE — Telephone Encounter (Signed)
Tell him to stay on Effexor for now. NO mountain biking over this weekend. Take it easy. See me next week

## 2012-01-23 NOTE — Telephone Encounter (Signed)
Caller: Ran/Patient; Patient Name: Michael Lambert; PCP: Gershon Crane [facility]); Best Callback Phone Number: [phone]; Dr Clent Ridges has been working with patient about various antidepressants. Patient states there seems to be a side effect of blood pressure elevating with the antidepressives. Currently is on Effexor since 01/16/12 notes BP this morning with home device is 164/94.  Using a public monitor in local store diastolic number was even higher.  Doesn't really feel better on Effexor but acknowledges he has not been on it long enough.  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  Patient reports one random sensation of tingling in the top of his head. He is concerned about [employer].  Triaged using Hypertension, Diagnosed or Suspected with a disposition to see in 72 hours due to multiple elevated blood pressures readings without symptoms. Care advice given as appropriate.  Clinical profile verified using Epic.  OFFICE:  PLEASE FOLLOW UP WITH PATIENT REGARDING BLOOD PRESSURE CONCERNS AND IF HE CAN MOUNTAIN BIKE AT THIS LEVEL OF BLOOD PRESSURE ELEVATION. THANKS

## 2012-01-23 NOTE — Telephone Encounter (Signed)
I spoke with pt  

## 2012-01-26 ENCOUNTER — Encounter: Payer: Self-pay | Admitting: Family Medicine

## 2012-01-26 ENCOUNTER — Telehealth: Payer: Self-pay | Admitting: Family Medicine

## 2012-01-26 ENCOUNTER — Ambulatory Visit (INDEPENDENT_AMBULATORY_CARE_PROVIDER_SITE_OTHER): Payer: Managed Care, Other (non HMO) | Admitting: Family Medicine

## 2012-01-26 VITALS — BP 148/96 | HR 77 | Temp 98.5°F | Wt 173.0 lb

## 2012-01-26 DIAGNOSIS — R030 Elevated blood-pressure reading, without diagnosis of hypertension: Secondary | ICD-10-CM

## 2012-01-26 DIAGNOSIS — F419 Anxiety disorder, unspecified: Secondary | ICD-10-CM

## 2012-01-26 DIAGNOSIS — F341 Dysthymic disorder: Secondary | ICD-10-CM

## 2012-01-26 MED ORDER — BUPROPION HCL ER (XL) 150 MG PO TB24: 150.0000 mg | ORAL_TABLET | Freq: Every day | ORAL | Status: DC

## 2012-01-26 MED ORDER — DIAZEPAM 5 MG PO TABS: 5.0000 mg | ORAL_TABLET | Freq: Four times a day (QID) | ORAL | Status: DC | PRN

## 2012-01-26 MED ORDER — DIAZEPAM 5 MG PO TABS: 5.0000 mg | ORAL_TABLET | Freq: Two times a day (BID) | ORAL | Status: AC | PRN

## 2012-01-26 NOTE — Telephone Encounter (Signed)
Michael Lambert called req double scripts for Valium and the one Wellbutrin script. Pls call pharmacy.

## 2012-01-26 NOTE — Progress Notes (Signed)
  Subjective:    Patient ID: Michael Lambert, male    DOB: 06/27/1955, 56 y.o.   MRN: [id number]  HPI Here to follow up on depression and anxiety, and now with elevated BPs. He has never had high Bps before. He is taking Effexor along with wellbutrin, and he is not happt with the results. He says that depression is not really an issue now but that anxiety is his main concern. He has a lot of sexual side effects from the current meds, and this past week his BP has been in the 140s and 150s over 90s.    Review of Systems  Constitutional: Negative.   Respiratory: Negative.   Cardiovascular: Negative.   Neurological: Negative.        Objective:   Physical Exam  Constitutional: He is oriented to person, place, and time. He appears well-developed and well-nourished.  Cardiovascular: Normal rate, regular rhythm, normal heart sounds and intact distal pulses.   Abdominal: Soft. Bowel sounds are normal.  Neurological: He is alert and oriented to person, place, and time.  Psychiatric: He has a normal mood and affect. His behavior is normal. Thought content normal.          Assessment & Plan:  We will taper off Wellbutrin and stop the effexor. Take 150 mg a day of Wellbutrin for 2 weeks and then stop. Start on Valium 5 mg bid. Recheck on 2 weeks. I think his BP will come back down on its own.

## 2012-01-26 NOTE — Telephone Encounter (Signed)
Pt called and said that he rcvd 2 scripts for Valium. Pt took scripts to [facility] on [address]. Pharmacy will not fill script. Also was suppose to get a script for partial script for Wellbutrin. Pls call.

## 2012-01-26 NOTE — Telephone Encounter (Signed)
I did clarify the order and spoke with the pharmacist.

## 2012-03-05 ENCOUNTER — Encounter: Payer: Self-pay | Admitting: Family Medicine

## 2012-03-05 ENCOUNTER — Ambulatory Visit (INDEPENDENT_AMBULATORY_CARE_PROVIDER_SITE_OTHER): Payer: Managed Care, Other (non HMO) | Admitting: Family Medicine

## 2012-03-05 ENCOUNTER — Other Ambulatory Visit: Payer: Self-pay | Admitting: Family Medicine

## 2012-03-05 VITALS — BP 120/80 | HR 74 | Temp 98.6°F | Wt 178.0 lb

## 2012-03-05 DIAGNOSIS — R030 Elevated blood-pressure reading, without diagnosis of hypertension: Secondary | ICD-10-CM

## 2012-03-05 DIAGNOSIS — F411 Generalized anxiety disorder: Secondary | ICD-10-CM

## 2012-03-05 DIAGNOSIS — F419 Anxiety disorder, unspecified: Secondary | ICD-10-CM

## 2012-03-05 MED ORDER — SERTRALINE HCL 25 MG PO TABS: 25.0000 mg | ORAL_TABLET | Freq: Every day | ORAL | Status: DC

## 2012-03-05 NOTE — Progress Notes (Signed)
  Subjective:    Patient ID: Stasia Cavalier, male    DOB: 1955/11/08, 56 y.o.   MRN: [id number]  HPI Here to follow up elevated BP and anxiety. He tapered off Wellbutrin as we discussed, and he has done with this. He then tried several Valium pills but stopped this because it was too sedating. Then decided to try Zoloft again, but he has been taking 1/2 of a 50 mg tab daily. This has worked quite well for him and he so far has avoided the sexual side effects that he got from the higher dose. His BP at home has come down to the normal range.    Review of Systems  Constitutional: Negative.   Respiratory: Negative.   Cardiovascular: Negative.   Psychiatric/Behavioral: Negative.        Objective:   Physical Exam  Constitutional: He appears well-developed and well-nourished.  Cardiovascular: Normal rate, regular rhythm, normal heart sounds and intact distal pulses.   Pulmonary/Chest: Effort normal and breath sounds normal.  Psychiatric: He has a normal mood and affect. His behavior is normal. Thought content normal.          Assessment & Plan:  His BP is stable. We will stay on 25 mg of Zoloft daily. Recheck prn

## 2012-05-04 ENCOUNTER — Telehealth: Payer: Self-pay | Admitting: Family Medicine

## 2012-05-04 MED ORDER — SERTRALINE HCL 25 MG PO TABS: 25.0000 mg | ORAL_TABLET | Freq: Two times a day (BID) | ORAL | Status: DC

## 2012-05-04 NOTE — Telephone Encounter (Signed)
Okay, call in #60 with 11 rf

## 2012-05-04 NOTE — Telephone Encounter (Signed)
Pt left a voice message, he would like to increase the Zoloft 25 mg to bid instead of qd. ( wants to keep same dosage, just change to bid )

## 2012-05-04 NOTE — Telephone Encounter (Signed)
Left message on machine for pt 

## 2012-05-17 ENCOUNTER — Other Ambulatory Visit: Payer: Self-pay | Admitting: Family Medicine

## 2012-05-17 NOTE — Telephone Encounter (Signed)
Pt would like a refill of diazepam (VALIUM) 5 MG tablet. Pt is going out of town Fri and needs before then.  [facility]

## 2012-05-18 NOTE — Telephone Encounter (Signed)
Pt following up on script.

## 2012-05-21 MED ORDER — DIAZEPAM 5 MG PO TABS: 5.0000 mg | ORAL_TABLET | Freq: Four times a day (QID) | ORAL | Status: DC | PRN

## 2012-05-21 NOTE — Telephone Encounter (Signed)
Call in #60 with 2 rf 

## 2012-05-21 NOTE — Telephone Encounter (Signed)
I called in script and left voice message for pt. 

## 2012-08-20 ENCOUNTER — Ambulatory Visit (INDEPENDENT_AMBULATORY_CARE_PROVIDER_SITE_OTHER): Payer: Managed Care, Other (non HMO) | Admitting: Family Medicine

## 2012-08-20 ENCOUNTER — Encounter: Payer: Self-pay | Admitting: Family Medicine

## 2012-08-20 VITALS — BP 142/90 | HR 74 | Temp 99.2°F | Ht 69.5 in | Wt 176.0 lb

## 2012-08-20 DIAGNOSIS — Z0000 Encounter for general adult medical examination without abnormal findings: Secondary | ICD-10-CM

## 2012-08-20 LAB — PSA: PSA: 0.81 ng/mL (ref 0.10–4.00)

## 2012-08-20 LAB — CBC WITH DIFFERENTIAL/PLATELET
Basophils Relative: 1.1 % (ref 0.0–3.0)
Eosinophils Relative: 2.9 % (ref 0.0–5.0)
HCT: 44.5 % (ref 39.0–52.0)
Hemoglobin: 15.2 g/dL (ref 13.0–17.0)
Lymphs Abs: 1.4 10*3/uL (ref 0.7–4.0)
Monocytes Relative: 7.7 % (ref 3.0–12.0)
Platelets: 214 10*3/uL (ref 150.0–400.0)
RBC: 4.62 Mil/uL (ref 4.22–5.81)
WBC: 5.3 10*3/uL (ref 4.5–10.5)

## 2012-08-20 LAB — HEPATIC FUNCTION PANEL
Alkaline Phosphatase: 62 U/L (ref 39–117)
Bilirubin, Direct: 0.1 mg/dL (ref 0.0–0.3)
Total Bilirubin: 0.8 mg/dL (ref 0.3–1.2)

## 2012-08-20 LAB — POCT URINALYSIS DIPSTICK
Bilirubin, UA: NEGATIVE
Glucose, UA: NEGATIVE
Ketones, UA: NEGATIVE
Spec Grav, UA: 1.025
Urobilinogen, UA: 0.2

## 2012-08-20 LAB — LDL CHOLESTEROL, DIRECT: Direct LDL: 143.7 mg/dL

## 2012-08-20 LAB — LIPID PANEL
HDL: 56.9 mg/dL (ref >39.00)
Total CHOL/HDL Ratio: 4
Triglycerides: 101 mg/dL (ref 0.0–149.0)

## 2012-08-20 LAB — BASIC METABOLIC PANEL
Calcium: 9.1 mg/dL (ref 8.4–10.5)
GFR: 102.38 mL/min (ref >60.00)
Potassium: 4.5 mEq/L (ref 3.5–5.1)
Sodium: 138 mEq/L (ref 135–145)

## 2012-08-20 LAB — TSH: TSH: 2.65 u[IU]/mL (ref 0.35–5.50)

## 2012-08-20 MED ORDER — VENLAFAXINE HCL ER 75 MG PO CP24: 75.0000 mg | ORAL_CAPSULE | Freq: Every day | ORAL | Status: DC

## 2012-08-20 NOTE — Progress Notes (Signed)
Quick Note:  I released results in my chart. ______ 

## 2012-08-20 NOTE — Progress Notes (Signed)
  Subjective:    Patient ID: Michael Lambert, male    DOB: 06/22/1955, 57 y.o.   MRN: [id number]  HPI 57 yr old male for a cpx. He feels well except for his chronic anxiety. He has been on Zoloft for several years now and it is not working as well as it used to. He has tried increasing the dose but when he gets to 75 mg a day or more he gets sexual side effects. His last colonoscopy was 14 years ago and he has repeatedly called Dr. Donavan Burnet office to set up another one, but they do not return his calls.    Review of Systems  Constitutional: Negative.   HENT: Negative.   Eyes: Negative.   Respiratory: Negative.   Cardiovascular: Negative.   Gastrointestinal: Negative.   Genitourinary: Negative.   Musculoskeletal: Negative.   Skin: Negative.   Neurological: Negative.   Psychiatric/Behavioral: Negative for suicidal ideas, hallucinations, behavioral problems, confusion, sleep disturbance, self-injury, dysphoric mood, decreased concentration and agitation. The patient is nervous/anxious. The patient is not hyperactive.        Objective:   Physical Exam  Constitutional: He is oriented to person, place, and time. He appears well-developed and well-nourished. No distress.  HENT:  Head: Normocephalic and atraumatic.  Right Ear: External ear normal.  Left Ear: External ear normal.  Nose: Nose normal.  Mouth/Throat: Oropharynx is clear and moist. No oropharyngeal exudate.  Eyes: Conjunctivae and EOM are normal. Pupils are equal, round, and reactive to light. Right eye exhibits no discharge. Left eye exhibits no discharge. No scleral icterus.  Neck: Neck supple. No JVD present. No tracheal deviation present. No thyromegaly present.  Cardiovascular: Normal rate, regular rhythm, normal heart sounds and intact distal pulses.  Exam reveals no gallop and no friction rub.   No murmur heard. EKG normal   Pulmonary/Chest: Effort normal and breath sounds normal. No respiratory distress. He has no wheezes.  He has no rales. He exhibits no tenderness.  Abdominal: Soft. Bowel sounds are normal. He exhibits no distension and no mass. There is no tenderness. There is no rebound and no guarding.  Genitourinary: Rectum normal, prostate normal and penis normal. Guaiac negative stool. No penile tenderness.  Musculoskeletal: Normal range of motion. He exhibits no edema and no tenderness.  Lymphadenopathy:    He has no cervical adenopathy.  Neurological: He is alert and oriented to person, place, and time. He has normal reflexes. No cranial nerve deficit. He exhibits normal muscle tone. Coordination normal.  Skin: Skin is warm and dry. No rash noted. He is not diaphoretic. No erythema. No pallor.  Psychiatric: He has a normal mood and affect. His behavior is normal. Judgment and thought content normal.          Assessment & Plan:  Well exam. Switch from Zoloft to Effexor XR daily. We will refer to [facility] for a colonoscopy

## 2012-08-30 ENCOUNTER — Encounter: Payer: Self-pay | Admitting: Gastroenterology

## 2012-08-30 ENCOUNTER — Encounter: Payer: Self-pay | Admitting: Family Medicine

## 2012-09-14 ENCOUNTER — Ambulatory Visit (AMBULATORY_SURGERY_CENTER): Payer: Managed Care, Other (non HMO) | Admitting: *Deleted

## 2012-09-14 VITALS — Ht 69.0 in | Wt 179.4 lb

## 2012-09-14 DIAGNOSIS — Z1211 Encounter for screening for malignant neoplasm of colon: Secondary | ICD-10-CM

## 2012-09-14 MED ORDER — NA SULFATE-K SULFATE-MG SULF 17.5-3.13-1.6 GM/177ML PO SOLN: 1.0000 | Freq: Once | ORAL | Status: DC

## 2012-09-14 NOTE — Progress Notes (Signed)
Denies allergies to eggs or soy products. Denies any complications with sedation or anesthesia. 

## 2012-09-15 ENCOUNTER — Encounter: Payer: Self-pay | Admitting: Gastroenterology

## 2012-10-03 ENCOUNTER — Other Ambulatory Visit: Payer: Self-pay | Admitting: Family Medicine

## 2012-10-04 ENCOUNTER — Ambulatory Visit (INDEPENDENT_AMBULATORY_CARE_PROVIDER_SITE_OTHER): Payer: Managed Care, Other (non HMO) | Admitting: Family Medicine

## 2012-10-04 ENCOUNTER — Encounter: Payer: Self-pay | Admitting: Family Medicine

## 2012-10-04 VITALS — BP 138/86 | HR 66 | Temp 98.7°F | Wt 178.0 lb

## 2012-10-04 DIAGNOSIS — F411 Generalized anxiety disorder: Secondary | ICD-10-CM

## 2012-10-04 DIAGNOSIS — F419 Anxiety disorder, unspecified: Secondary | ICD-10-CM

## 2012-10-04 MED ORDER — VENLAFAXINE HCL ER 150 MG PO CP24: 150.0000 mg | ORAL_CAPSULE | Freq: Every day | ORAL | Status: DC

## 2012-10-04 NOTE — Progress Notes (Signed)
  Subjective:    Patient ID: Michael Lambert, male    DOB: April 24, 1956, 57 y.o.   MRN: [id number]  HPI Here to follow up on his anxiety. Last month we started him on Effexor, and he has been pleased with the effects. He feels less anxious and happier, though he may want a stronger dose. He sleeps well.    Review of Systems  Constitutional: Negative.   Neurological: Negative.   Psychiatric/Behavioral: Negative.        Objective:   Physical Exam  Constitutional: He is oriented to person, place, and time. He appears well-developed and well-nourished.  Neurological: He is alert and oriented to person, place, and time.  Psychiatric: He has a normal mood and affect. His behavior is normal. Judgment and thought content normal.          Assessment & Plan:  We will increase the Effexor XR to 150 mg a day.

## 2012-10-08 ENCOUNTER — Encounter: Payer: Self-pay | Admitting: Gastroenterology

## 2012-10-08 ENCOUNTER — Ambulatory Visit (AMBULATORY_SURGERY_CENTER): Payer: Managed Care, Other (non HMO) | Admitting: Gastroenterology

## 2012-10-08 VITALS — BP 111/69 | HR 59 | Temp 97.4°F | Resp 21 | Ht 69.0 in | Wt 179.0 lb

## 2012-10-08 DIAGNOSIS — D126 Benign neoplasm of colon, unspecified: Secondary | ICD-10-CM

## 2012-10-08 DIAGNOSIS — Z1211 Encounter for screening for malignant neoplasm of colon: Secondary | ICD-10-CM

## 2012-10-08 HISTORY — PX: OTHER SURGICAL HISTORY: SHX169

## 2012-10-08 MED ORDER — SODIUM CHLORIDE 0.9 % IV SOLN: 500.0000 mL | INTRAVENOUS | Status: DC

## 2012-10-08 NOTE — Op Note (Signed)
[facility] [address] [place], [address]   COLONOSCOPY PROCEDURE REPORT  PATIENT: Michael Lambert, Michael Lambert  MR#: [id number] BIRTHDATE: [birth year] , 56  yrs. old GENDER: Male ENDOSCOPIST: Louis Meckel, MD REFERRED XB:[id number] Clent Ridges, M.D. PROCEDURE DATE:  10/08/2012 PROCEDURE:   Colonoscopy with cold biopsy polypectomy ASA CLASS:   Class II INDICATIONS:average risk screening. MEDICATIONS: MAC sedation, administered by CRNA and propofol (Diprivan) 200mg  IV  DESCRIPTION OF PROCEDURE:   After the risks benefits and alternatives of the procedure were thoroughly explained, informed consent was obtained.  A digital rectal exam revealed no abnormalities of the rectum.   The LB [id number]  endoscope was introduced through the anus and advanced to the cecum, which was identified by both the appendix and ileocecal valve. No adverse events experienced.   The quality of the prep was Suprep good  The instrument was then slowly withdrawn as the colon was fully examined.      COLON FINDINGS: A sessile polyp was found at the cecum.  A polypectomy was performed with cold forceps.  The resection was complete and the polyp tissue was completely retrieved.   The colon mucosa was otherwise normal.  Retroflexed views revealed no abnormalities. The time to cecum=3 minutes 11 seconds.  Withdrawal time=7 minutes 18 seconds.  The scope was withdrawn and the procedure completed. COMPLICATIONS: There were no complications.  ENDOSCOPIC IMPRESSION: 1.   Sessile polyp was found at the cecum; polypectomy was performed with cold forceps 2.   The colon mucosa was otherwise normal  RECOMMENDATIONS: If the polyp(s) removed today are proven to be adenomatous (pre-cancerous) polyps, you will need a repeat colonoscopy in 5 years.  Otherwise you should continue to follow colorectal cancer screening guidelines for "routine risk" patients with colonoscopy in 10 years.  You will receive a letter  within 1-2 weeks with the results of your biopsy as well as final recommendations.  Please call my office if you have not received a letter after 3 weeks.   eSigned:  Louis Meckel, MD 10/08/2012 11:50 AM   cc:

## 2012-10-08 NOTE — Progress Notes (Signed)
Called to room to assist during endoscopic procedure.  Patient ID and intended procedure confirmed with present staff. Received instructions for my participation in the procedure from the performing physician.  

## 2012-10-08 NOTE — Progress Notes (Signed)
Patient did not have preoperative order for IV antibiotic SSI prophylaxis. (G8918)  Patient did not experience any of the following events: a burn prior to discharge; a fall within the facility; wrong site/side/patient/procedure/implant event; or a hospital transfer or hospital admission upon discharge from the facility. (G8907)  

## 2012-10-08 NOTE — Patient Instructions (Addendum)
YOU HAD AN ENDOSCOPIC PROCEDURE TODAY AT THE [facility]: Refer to the procedure report that was given to you for any specific questions about what was found during the examination.  If the procedure report does not answer your questions, please call your gastroenterologist to clarify.  If you requested that your care partner not be given the details of your procedure findings, then the procedure report has been included in a sealed envelope for you to review at your convenience later.  YOU SHOULD EXPECT: Some feelings of bloating in the abdomen. Passage of more gas than usual.  Walking can help get rid of the air that was put into your GI tract during the procedure and reduce the bloating. If you had a lower endoscopy (such as a colonoscopy or flexible sigmoidoscopy) you may notice spotting of blood in your stool or on the toilet paper. If you underwent a bowel prep for your procedure, then you may not have a normal bowel movement for a few days.  DIET: Your first meal following the procedure should be a light meal and then it is ok to progress to your normal diet.  A half-sandwich or bowl of soup is an example of a good first meal.  Heavy or fried foods are harder to digest and may make you feel nauseous or bloated.  Likewise meals heavy in dairy and vegetables can cause extra gas to form and this can also increase the bloating.  Drink plenty of fluids but you should avoid alcoholic beverages for 24 hours.  ACTIVITY: Your care partner should take you home directly after the procedure.  You should plan to take it easy, moving slowly for the rest of the day.  You can resume normal activity the day after the procedure however you should NOT DRIVE or use heavy machinery for 24 hours (because of the sedation medicines used during the test).    SYMPTOMS TO REPORT IMMEDIATELY: A gastroenterologist can be reached at any hour.  During normal business hours, 8:30 AM to 5:00 PM Monday through Friday,  call [phone].  After hours and on weekends, please call the GI answering service at [phone] who will take a message and have the physician on call contact you.   Following lower endoscopy (colonoscopy or flexible sigmoidoscopy):  Excessive amounts of blood in the stool  Significant tenderness or worsening of abdominal pains  Swelling of the abdomen that is new, acute  Fever of 100F or higher    FOLLOW UP: If any biopsies were taken you will be contacted by phone or by letter within the next 1-3 weeks.  Call your gastroenterologist if you have not heard about the biopsies in 3 weeks.  Our staff will call the home number listed on your records the next business day following your procedure to check on you and address any questions or concerns that you may have at that time regarding the information given to you following your procedure. This is a courtesy call and so if there is no answer at the home number and we have not heard from you through the emergency physician on call, we will assume that you have returned to your regular daily activities without incident.  SIGNATURES/CONFIDENTIALITY: You and/or your care partner have signed paperwork which will be entered into your electronic medical record.  These signatures attest to the fact that that the information above on your After Visit Summary has been reviewed and is understood.  Full responsibility of the confidentiality   of this discharge information lies with you and/or your care-partner.     

## 2012-10-12 ENCOUNTER — Telehealth: Payer: Self-pay

## 2012-10-12 NOTE — Telephone Encounter (Signed)
No answer, left message to call if any problems following procedure on Friday.

## 2012-10-20 ENCOUNTER — Encounter: Payer: Self-pay | Admitting: Gastroenterology

## 2012-12-13 ENCOUNTER — Encounter: Payer: Self-pay | Admitting: Family Medicine

## 2013-03-24 ENCOUNTER — Other Ambulatory Visit: Payer: Self-pay

## 2013-05-16 ENCOUNTER — Other Ambulatory Visit: Payer: Self-pay | Admitting: Family Medicine

## 2013-05-16 NOTE — Telephone Encounter (Signed)
Refill for one year 

## 2013-05-26 ENCOUNTER — Encounter: Payer: Self-pay | Admitting: Family Medicine

## 2013-09-07 ENCOUNTER — Other Ambulatory Visit: Payer: Self-pay | Admitting: Family Medicine

## 2013-11-30 ENCOUNTER — Other Ambulatory Visit: Payer: Self-pay | Admitting: Family Medicine

## 2014-02-01 ENCOUNTER — Telehealth: Payer: Self-pay | Admitting: Family Medicine

## 2014-02-01 NOTE — Telephone Encounter (Signed)
Lm on vm for pt.to cb °

## 2014-02-01 NOTE — Telephone Encounter (Signed)
Okay to schedule

## 2014-02-01 NOTE — Telephone Encounter (Signed)
Pt has a low persiatant headache for a couple of weeks that will not go away. At the back of his neck, base of skull at left rear. Pt requested appt today or tomorrow. Only 2 SD on thurs. pls advise. Will see someone else if he has to. Pt is going to [place] this weekend to a wedding and doesn't want to take this w/ him.

## 2014-02-02 ENCOUNTER — Ambulatory Visit (INDEPENDENT_AMBULATORY_CARE_PROVIDER_SITE_OTHER): Payer: Managed Care, Other (non HMO) | Admitting: Family Medicine

## 2014-02-02 ENCOUNTER — Encounter: Payer: Self-pay | Admitting: Family Medicine

## 2014-02-02 VITALS — BP 147/88 | HR 75 | Temp 98.9°F | Ht 69.0 in | Wt 179.0 lb

## 2014-02-02 DIAGNOSIS — R51 Headache: Secondary | ICD-10-CM

## 2014-02-02 DIAGNOSIS — Z23 Encounter for immunization: Secondary | ICD-10-CM

## 2014-02-02 MED ORDER — DICLOFENAC SODIUM 75 MG PO TBEC: 75.0000 mg | DELAYED_RELEASE_TABLET | Freq: Two times a day (BID) | ORAL | Status: DC

## 2014-02-02 NOTE — Telephone Encounter (Signed)
Pt has been scheduled.  °

## 2014-02-02 NOTE — Progress Notes (Signed)
   Subjective:    Patient ID: Michael Lambert, male    DOB: [birth year], 58 y.o.   MRN: [id number]  HPI Here to check a headache that started about 2 weeks ago. This is centered in the back of the head on the left side. No recent trauma. This is a dull ache that comes and goes. No vision changes or nausea. This feels very different from the migraines he used to get when he was younger. His neck has been stiff and sore for several years, and he often feels the need to stretch this out.    Review of Systems  Constitutional: Negative.   Musculoskeletal: Positive for neck pain and neck stiffness.  Neurological: Positive for headaches. Negative for dizziness, tremors, seizures, syncope, facial asymmetry, speech difficulty, weakness, light-headedness and numbness.       Objective:   Physical Exam  Constitutional: He appears well-developed and well-nourished. No distress.  Neck: No thyromegaly present.  He is mildly tender in the posterior neck at the base of the skull. His ROM is limited by pain  Lymphadenopathy:    He has no cervical adenopathy.          Assessment & Plan:  This is a tension HA stemming from some arthritis in the cervical spine. Try heat, massages, and Diclofenac. Recheck prn

## 2014-02-02 NOTE — Progress Notes (Signed)
Pre visit review using our clinic review tool, if applicable. No additional management support is needed unless otherwise documented below in the visit note. 

## 2014-02-14 ENCOUNTER — Ambulatory Visit (INDEPENDENT_AMBULATORY_CARE_PROVIDER_SITE_OTHER): Payer: Managed Care, Other (non HMO) | Admitting: Family Medicine

## 2014-02-14 ENCOUNTER — Encounter: Payer: Self-pay | Admitting: Family Medicine

## 2014-02-14 VITALS — BP 150/90 | HR 60 | Temp 98.8°F | Wt 178.0 lb

## 2014-02-14 DIAGNOSIS — IMO0002 Reserved for concepts with insufficient information to code with codable children: Secondary | ICD-10-CM

## 2014-02-14 DIAGNOSIS — T148XXA Other injury of unspecified body region, initial encounter: Secondary | ICD-10-CM

## 2014-02-14 DIAGNOSIS — Z23 Encounter for immunization: Secondary | ICD-10-CM

## 2014-02-14 NOTE — Progress Notes (Signed)
  Garret Reddish, MD Phone: [phone]  Subjective:   Michael Lambert is a 58 y.o. year old very pleasant male patient who presents with the following:  Laceration Occurred when his car door swung back (spring loaded) when he was getting into his car. Just on outside of left eye. Did not hit his eye. No eye pain. Mild to moderate ache and some bruising under eye. Does take an aspirin a day but no other blood thinners.  ROS-No headache or LOC. No blurry vision. Not seeing stars.   Past Medical History- anxiety, hld, BPH  Medications- reviewed and updated Current Outpatient Prescriptions  Medication Sig Dispense Refill  . aspirin 81 MG tablet Take 81 mg by mouth daily.        . cetirizine (ZYRTEC) 10 MG tablet Take 10 mg by mouth 2 (two) times daily.       Marland Kitchen omeprazole (PRILOSEC OTC) 20 MG tablet Take by mouth.      Marland Kitchen omeprazole (PRILOSEC) 20 MG capsule Take 20 mg by mouth daily.        . sertraline (ZOLOFT) 25 MG tablet TAKE 1 TABLET (25 MG TOTAL) BY MOUTH 2 (TWO) TIMES DAILY.  60 tablet  11  . VYTORIN 10-40 MG per tablet TAKE 1 TABLET DAILY  90 tablet  0  . diclofenac (VOLTAREN) 75 MG EC tablet Take 1 tablet (75 mg total) by mouth 2 (two) times daily.  60 tablet  2   No current facility-administered medications for this visit.    Objective: BP 150/90  Pulse 60  Temp(Src) 98.8 F (37.1 C) (Oral)  Wt 178 lb (80.74 kg) Gen: NAD, resting comfortably Eye: no scleral abrasion noted, normal vision (crude testing by reading objects in room) CV: RRR no murmurs rubs or gallops Lungs: CTAB no crackles, wheeze, rhonchi Ext: no edema Skin: warm, dry, 3 cm shallow (wound 41mm at maximum separation) laceration lateral to left eye Neuro: grossly normal, moves all extremities   Assessment/Plan:  Laceration Irrigated with 250 cc of normal saline. Repaired with dermabond with at least 1 cm left from corner of eye. Advised of after care. In particular, no swimming until film falls off. Tdap  given as none in last 10 years noted in computer. No concern for concussion. Total length of laceration 3cm but due to avoiding inner portion total repair <2.5 cm.   Orders Placed This Encounter  Procedures  . Tdap vaccine greater than or equal to 7yo IM   Meds ordered this encounter  Medications  . omeprazole (PRILOSEC OTC) 20 MG tablet    Sig: Take by mouth.

## 2014-02-14 NOTE — Progress Notes (Signed)
Pre visit review using our clinic review tool, if applicable. No additional management support is needed unless otherwise documented below in the visit note. 

## 2014-02-14 NOTE — Patient Instructions (Addendum)
Tetanus shot today Try not to ice the area with the dermabond This may scar  Laceration Care, Adult A laceration is a cut or lesion that goes through all layers of the skin and into the tissue just beneath the skin. TREATMENT  Some lacerations may not require closure. Some lacerations may not be able to be closed due to an increased risk of infection. It is important to see your caregiver as soon as possible after an injury to minimize the risk of infection and maximize the opportunity for successful closure. If closure is appropriate, pain medicines may be given, if needed. The wound will be cleaned to help prevent infection. Your caregiver will use stitches (sutures), staples, wound glue (adhesive), or skin adhesive strips to repair the laceration. These tools bring the skin edges together to allow for faster healing and a better cosmetic outcome. However, all wounds will heal with a scar. Once the wound has healed, scarring can be minimized by covering the wound with sunscreen during the day for 1 full year. HOME CARE INSTRUCTIONS  For wound adhesive:  You may briefly wet your wound in the shower or bath. Do not soak or scrub the wound. Do not swim. Avoid periods of heavy perspiration until the skin adhesive has fallen off on its own. After showering or bathing, gently pat the wound dry with a clean towel.  Do not apply liquid medicine, cream medicine, or ointment medicine to your wound while the skin adhesive is in place. This may loosen the film before your wound is healed.  If a dressing is placed over the wound, be careful not to apply tape directly over the skin adhesive. This may cause the adhesive to be pulled off before the wound is healed.  Avoid prolonged exposure to sunlight or tanning lamps while the skin adhesive is in place. Exposure to ultraviolet light in the first year will darken the scar.  The skin adhesive will usually remain in place for 5 to 10 days, then naturally fall  off the skin. Do not pick at the adhesive film. You may need a tetanus shot if:  You cannot remember when you had your last tetanus shot.  You have never had a tetanus shot. If you get a tetanus shot, your arm may swell, get red, and feel warm to the touch. This is common and not a problem. If you need a tetanus shot and you choose not to have one, there is a rare chance of getting tetanus. Sickness from tetanus can be serious. SEEK MEDICAL CARE IF:   You have redness, swelling, or increasing pain in the wound.  You see a red line that goes away from the wound.  You have yellowish-white fluid (pus) coming from the wound.  You have a fever.  You notice a bad smell coming from the wound or dressing.  Your wound breaks open before or after sutures have been removed.  You notice something coming out of the wound such as wood or glass.  Your wound is on your hand or foot and you cannot move a finger or toe. SEEK IMMEDIATE MEDICAL CARE IF:   Your pain is not controlled with prescribed medicine.  You have severe swelling around the wound causing pain and numbness or a change in color in your arm, hand, leg, or foot.  Your wound splits open and starts bleeding.  You have worsening numbness, weakness, or loss of function of any joint around or beyond the wound.  You develop  painful lumps near the wound or on the skin anywhere on your body. MAKE SURE YOU:   Understand these instructions.  Will watch your condition.  Will get help right away if you are not doing well or get worse. Document Released: 05/05/2005 Document Revised: 07/28/2011 Document Reviewed: 10/29/2010 [facility] Patient Information 2015 [place], [place]. This information is not intended to replace advice given to you by your health care provider. Make sure you discuss any questions you have with your health care provider.

## 2014-02-19 ENCOUNTER — Encounter: Payer: Self-pay | Admitting: Family Medicine

## 2014-02-20 NOTE — Telephone Encounter (Signed)
If he is doing okay we can wait until the cpx to discuss this

## 2014-03-02 ENCOUNTER — Other Ambulatory Visit: Payer: Self-pay | Admitting: Family Medicine

## 2014-03-09 ENCOUNTER — Other Ambulatory Visit: Payer: Managed Care, Other (non HMO)

## 2014-03-10 ENCOUNTER — Other Ambulatory Visit (INDEPENDENT_AMBULATORY_CARE_PROVIDER_SITE_OTHER): Payer: Managed Care, Other (non HMO)

## 2014-03-10 DIAGNOSIS — Z0000 Encounter for general adult medical examination without abnormal findings: Secondary | ICD-10-CM

## 2014-03-10 LAB — BASIC METABOLIC PANEL
BUN: 19 mg/dL (ref 6–23)
CALCIUM: 10.1 mg/dL (ref 8.4–10.5)
CO2: 19 meq/L (ref 19–32)
Chloride: 108 mEq/L (ref 96–112)
Creatinine, Ser: 1.1 mg/dL (ref 0.4–1.5)
GFR: 76.56 mL/min (ref >60.00)
Glucose, Bld: 98 mg/dL (ref 70–99)
Potassium: 4.6 mEq/L (ref 3.5–5.1)
Sodium: 140 mEq/L (ref 135–145)

## 2014-03-10 LAB — HEPATIC FUNCTION PANEL
ALBUMIN: 4.3 g/dL (ref 3.5–5.2)
ALK PHOS: 55 U/L (ref 39–117)
ALT: 34 U/L (ref 0–53)
AST: 26 U/L (ref 0–37)
Bilirubin, Direct: 0.2 mg/dL (ref 0.0–0.3)
TOTAL PROTEIN: 7.7 g/dL (ref 6.0–8.3)
Total Bilirubin: 1 mg/dL (ref 0.2–1.2)

## 2014-03-10 LAB — POCT URINALYSIS DIPSTICK
Bilirubin, UA: NEGATIVE
Blood, UA: NEGATIVE
Glucose, UA: NEGATIVE
KETONES UA: NEGATIVE
LEUKOCYTES UA: NEGATIVE
Nitrite, UA: NEGATIVE
Protein, UA: NEGATIVE
Spec Grav, UA: 1.015
Urobilinogen, UA: 0.2
pH, UA: 7

## 2014-03-10 LAB — CBC WITH DIFFERENTIAL/PLATELET
BASOS PCT: 1.4 % (ref 0.0–3.0)
Basophils Absolute: 0.1 10*3/uL (ref 0.0–0.1)
EOS ABS: 0.1 10*3/uL (ref 0.0–0.7)
Eosinophils Relative: 2.3 % (ref 0.0–5.0)
HCT: 44.2 % (ref 39.0–52.0)
Hemoglobin: 14.6 g/dL (ref 13.0–17.0)
Lymphocytes Relative: 31.9 % (ref 12.0–46.0)
Lymphs Abs: 1.6 10*3/uL (ref 0.7–4.0)
MCHC: 33.1 g/dL (ref 30.0–36.0)
MCV: 98.2 fl (ref 78.0–100.0)
MONOS PCT: 7.7 % (ref 3.0–12.0)
Monocytes Absolute: 0.4 10*3/uL (ref 0.1–1.0)
NEUTROS ABS: 2.9 10*3/uL (ref 1.4–7.7)
Neutrophils Relative %: 56.7 % (ref 43.0–77.0)
Platelets: 246 10*3/uL (ref 150.0–400.0)
RBC: 4.5 Mil/uL (ref 4.22–5.81)
RDW: 12.8 % (ref 11.5–15.5)
WBC: 5.1 10*3/uL (ref 4.0–10.5)

## 2014-03-10 LAB — LIPID PANEL
CHOLESTEROL: 180 mg/dL (ref 0–200)
HDL: 61 mg/dL (ref >39.00)
LDL CALC: 100 mg/dL — AB (ref 0–99)
NONHDL: 119
Total CHOL/HDL Ratio: 3
Triglycerides: 97 mg/dL (ref 0.0–149.0)
VLDL: 19.4 mg/dL (ref 0.0–40.0)

## 2014-03-10 LAB — PSA: PSA: 0.59 ng/mL (ref 0.10–4.00)

## 2014-03-10 LAB — TSH: TSH: 2.71 u[IU]/mL (ref 0.35–4.50)

## 2014-03-22 ENCOUNTER — Ambulatory Visit (INDEPENDENT_AMBULATORY_CARE_PROVIDER_SITE_OTHER): Payer: Managed Care, Other (non HMO) | Admitting: Family Medicine

## 2014-03-22 ENCOUNTER — Encounter: Payer: Self-pay | Admitting: Family Medicine

## 2014-03-22 VITALS — BP 137/78 | HR 67 | Temp 98.8°F | Ht 69.0 in | Wt 176.0 lb

## 2014-03-22 DIAGNOSIS — Q742 Other congenital malformations of lower limb(s), including pelvic girdle: Secondary | ICD-10-CM

## 2014-03-22 DIAGNOSIS — Z0000 Encounter for general adult medical examination without abnormal findings: Secondary | ICD-10-CM

## 2014-03-22 MED ORDER — EZETIMIBE-SIMVASTATIN 10-40 MG PO TABS: ORAL_TABLET | ORAL | Status: DC

## 2014-03-22 MED ORDER — SERTRALINE HCL 25 MG PO TABS: ORAL_TABLET | ORAL | Status: DC

## 2014-03-22 NOTE — Progress Notes (Signed)
   Subjective:    Patient ID: Michael Lambert, male    DOB: 15-Jul-1955, 58 y.o.   MRN: [id number]  HPI 58 yr old male for a cpx. He feels well but has a few items to discuss. First he has had crooked toes all his life and asks for a referral about them. He had his right great toe straightened some years ago but the other toes are getting worse and they are painful. Also he mentions frequent spells of fluttering in the chest at a very predictable time. Most nights he has to get up twice to urinate, and when he goes back to bed he feels a fluttering in the chest with no other sx. No chest pain or SOB. This lasts until he falls asleep but it is gone when he wakes up later. This never occurs during the daytime. He takes all he medications together at bedtime.    Review of Systems  Constitutional: Negative.   HENT: Negative.   Eyes: Negative.   Respiratory: Negative.   Cardiovascular: Positive for palpitations. Negative for chest pain and leg swelling.  Gastrointestinal: Negative.   Genitourinary: Negative.   Musculoskeletal: Negative.   Skin: Negative.   Neurological: Negative.   Psychiatric/Behavioral: Negative.        Objective:   Physical Exam  Constitutional: He is oriented to person, place, and time. He appears well-developed and well-nourished. No distress.  HENT:  Head: Normocephalic and atraumatic.  Right Ear: External ear normal.  Left Ear: External ear normal.  Nose: Nose normal.  Mouth/Throat: Oropharynx is clear and moist. No oropharyngeal exudate.  Eyes: Conjunctivae and EOM are normal. Pupils are equal, round, and reactive to light. Right eye exhibits no discharge. Left eye exhibits no discharge. No scleral icterus.  Neck: Neck supple. No JVD present. No tracheal deviation present. No thyromegaly present.  Cardiovascular: Normal rate, regular rhythm, normal heart sounds and intact distal pulses.  Exam reveals no gallop and no friction rub.   No murmur heard. EKG normal    Pulmonary/Chest: Effort normal and breath sounds normal. No respiratory distress. He has no wheezes. He has no rales. He exhibits no tenderness.  Abdominal: Soft. Bowel sounds are normal. He exhibits no distension and no mass. There is no tenderness. There is no rebound and no guarding.  Genitourinary: Rectum normal and penis normal. Guaiac negative stool. No penile tenderness.  Prostate is moderately enlarged but not hard or nodular   Musculoskeletal: Normal range of motion. He exhibits no edema or tenderness.  The toes on both feet are very crooked, right worse then left   Lymphadenopathy:    He has no cervical adenopathy.  Neurological: He is alert and oriented to person, place, and time. He has normal reflexes. No cranial nerve deficit. He exhibits normal muscle tone. Coordination normal.  Skin: Skin is warm and dry. No rash noted. He is not diaphoretic. No erythema. No pallor.  Psychiatric: He has a normal mood and affect. His behavior is normal. Judgment and thought content normal.          Assessment & Plan:  Well exam. We will refer to Podiatry to evaluate the toes. His night time palpitations may be related to his medications. I suggetsed he take the Zoloft in the morning and take Vytorin and prilosec at night. If this continues we may evaluate further with a Holter monitor.

## 2014-03-22 NOTE — Progress Notes (Signed)
Pre visit review using our clinic review tool, if applicable. No additional management support is needed unless otherwise documented below in the visit note. 

## 2014-04-03 ENCOUNTER — Encounter: Payer: Self-pay | Admitting: Family Medicine

## 2014-04-17 ENCOUNTER — Ambulatory Visit (INDEPENDENT_AMBULATORY_CARE_PROVIDER_SITE_OTHER): Payer: Managed Care, Other (non HMO)

## 2014-04-17 ENCOUNTER — Ambulatory Visit (INDEPENDENT_AMBULATORY_CARE_PROVIDER_SITE_OTHER): Payer: Managed Care, Other (non HMO) | Admitting: Podiatry

## 2014-04-17 ENCOUNTER — Encounter: Payer: Self-pay | Admitting: Podiatry

## 2014-04-17 VITALS — BP 170/78 | HR 66 | Resp 16

## 2014-04-17 DIAGNOSIS — G5763 Lesion of plantar nerve, bilateral lower limbs: Secondary | ICD-10-CM

## 2014-04-17 DIAGNOSIS — M2040 Other hammer toe(s) (acquired), unspecified foot: Secondary | ICD-10-CM

## 2014-04-17 DIAGNOSIS — G5762 Lesion of plantar nerve, left lower limb: Secondary | ICD-10-CM

## 2014-04-17 DIAGNOSIS — B351 Tinea unguium: Secondary | ICD-10-CM

## 2014-04-17 NOTE — Progress Notes (Signed)
   Subjective:    Patient ID: Michael Lambert, male    DOB: 05-14-1956, 58 y.o.   MRN: [id number]  HPI Comments: "I have some wacky toes on my right foot"  Patient c/o tender toes on the right for several years. The toes are spread apart and curled under. Walking a lot at one time is uncomfortable. Wants to know options.   Also, concerned about white patches on the toenails.  Toe Pain       Review of Systems  All other systems reviewed and are negative.      Objective:   Physical Exam        Assessment & Plan:

## 2014-04-17 NOTE — Progress Notes (Signed)
Subjective:     Patient ID: Michael Lambert, male   DOB: [birth year], 58 y.o.   MRN: [id number]  HPI patient presents stating I get pain in the midfoot right I had an implant done by a physician at this office in the 1980s and my second and third toes do curl but not with significant discomfort   Review of Systems  All other systems reviewed and are negative.      Objective:   Physical Exam  Constitutional: He is oriented to person, place, and time.  Cardiovascular: Intact distal pulses.   Musculoskeletal: Normal range of motion.  Neurological: He is oriented to person, place, and time.  Skin: Skin is warm.  Nursing note and vitals reviewed.  neurovascular status found to be intact with muscle strength adequate and range of motion of the subtalar and midtarsal joint within normal limits. Patient is noted to have slight medial deviation second and third toes right and has a scar on the right first metatarsal with probable joint implantation that took place. There is adequate motion of the first MPJ with no crepitus and I did note that there is discomfort in the third intermetatarsal space right with radiating-like discomfort into the adjacent digits     Assessment:     Moderate forefoot instability with previous implant procedure done 30 years ago and probable neuroma symptomatology third interspace right very low grade or mild capsulitis-like symptoms of the second and third MPJ    Plan:     H&P and x-rays reviewed. Today I am focusing on the third interspace and I did do a neuro lysis injection with purified alcohol solution 1.5 mL and reappoint again in 2 weeks to reevaluate with consideration for neurectomy or continuation of neuro lysis treatment depending on response

## 2014-05-01 ENCOUNTER — Ambulatory Visit (INDEPENDENT_AMBULATORY_CARE_PROVIDER_SITE_OTHER): Payer: Managed Care, Other (non HMO) | Admitting: Podiatry

## 2014-05-01 ENCOUNTER — Encounter: Payer: Self-pay | Admitting: Podiatry

## 2014-05-01 VITALS — BP 163/84 | HR 64 | Resp 16

## 2014-05-01 DIAGNOSIS — G5763 Lesion of plantar nerve, bilateral lower limbs: Secondary | ICD-10-CM

## 2014-05-01 DIAGNOSIS — G5762 Lesion of plantar nerve, left lower limb: Secondary | ICD-10-CM

## 2014-05-01 NOTE — Progress Notes (Signed)
Subjective:     Patient ID: Michael Lambert, male   DOB: 15-Jun-1955, 58 y.o.   MRN: [id number]  HPI patient presents stating my right third interspace seems some better it was really improved for the first few days and now it is mildly improved but still sore with ambulation or tight shoes   Review of Systems     Objective:   Physical Exam Neurovascular status intact with continued shooting pains third interspace right foot with a moderate radiating like discomfort.    Assessment:     Neuroma symptomatology right still present    Plan:     Advised on condition and at this time we'll continue to work with conservatively. I did a neuro lysis injection consisting of purified alcohol solution and Marcaine into the third interspace and patient will reappoint in 2 weeks for reevaluation and probable treatment

## 2014-05-15 ENCOUNTER — Encounter: Payer: Self-pay | Admitting: Podiatry

## 2014-05-15 ENCOUNTER — Ambulatory Visit (INDEPENDENT_AMBULATORY_CARE_PROVIDER_SITE_OTHER): Payer: Managed Care, Other (non HMO) | Admitting: Podiatry

## 2014-05-15 VITALS — BP 115/69 | HR 69 | Resp 16

## 2014-05-15 DIAGNOSIS — G5763 Lesion of plantar nerve, bilateral lower limbs: Secondary | ICD-10-CM

## 2014-05-15 DIAGNOSIS — G5762 Lesion of plantar nerve, left lower limb: Secondary | ICD-10-CM

## 2014-05-15 NOTE — Progress Notes (Signed)
Subjective:     Patient ID: Michael Lambert, male   DOB: Feb 20, 1956, 58 y.o.   MRN: [id number]  HPI patient states that it's feeling quite a bit better but he has not yet fully tested his right foot   Review of Systems     Objective:   Physical Exam Neurovascular status intact with muscle strength adequate and noted to have moderate shooting pains third interspace right that have improved quite a bit from first visit    Assessment:     Neuroma symptomatology right that's improving    Plan:     Final injection today of a purified alcohol solution and Marcaine 1.3 mL into the third interspace. Reappoint as needed

## 2014-08-29 ENCOUNTER — Other Ambulatory Visit: Payer: Self-pay | Admitting: Family Medicine

## 2015-03-23 ENCOUNTER — Other Ambulatory Visit: Payer: Self-pay | Admitting: Family Medicine

## 2015-04-17 ENCOUNTER — Other Ambulatory Visit: Payer: Self-pay | Admitting: Family Medicine

## 2015-04-24 ENCOUNTER — Ambulatory Visit (INDEPENDENT_AMBULATORY_CARE_PROVIDER_SITE_OTHER): Payer: Managed Care, Other (non HMO) | Admitting: Family Medicine

## 2015-04-24 ENCOUNTER — Encounter: Payer: Self-pay | Admitting: Family Medicine

## 2015-04-24 VITALS — BP 148/82 | HR 68 | Temp 98.9°F | Ht 69.0 in | Wt 182.0 lb

## 2015-04-24 DIAGNOSIS — Z23 Encounter for immunization: Secondary | ICD-10-CM

## 2015-04-24 DIAGNOSIS — Z0000 Encounter for general adult medical examination without abnormal findings: Secondary | ICD-10-CM

## 2015-04-24 MED ORDER — ATORVASTATIN CALCIUM 40 MG PO TABS: 40.0000 mg | ORAL_TABLET | Freq: Every day | ORAL | Status: DC

## 2015-04-24 NOTE — Progress Notes (Signed)
   Subjective:    Patient ID: Michael Lambert, male    DOB: [birth year], 59 y.o.   MRN: [id number]  HPI 59 yr old male for a cpx. He feels well. His BP at home varies from [id number] to [id number] systolic, with diastolics from [id number]. He asks if there is a cheaper alternative to the Vytorin he is taking. He watches his diet closely.    Review of Systems  Constitutional: Negative.   HENT: Negative.   Eyes: Negative.   Respiratory: Negative.   Cardiovascular: Negative.   Gastrointestinal: Negative.   Genitourinary: Negative.   Musculoskeletal: Negative.   Skin: Negative.   Neurological: Negative.   Psychiatric/Behavioral: Negative.        Objective:   Physical Exam  Constitutional: He is oriented to person, place, and time. He appears well-developed and well-nourished. No distress.  HENT:  Head: Normocephalic and atraumatic.  Right Ear: External ear normal.  Left Ear: External ear normal.  Nose: Nose normal.  Mouth/Throat: Oropharynx is clear and moist. No oropharyngeal exudate.  Eyes: Conjunctivae and EOM are normal. Pupils are equal, round, and reactive to light. Right eye exhibits no discharge. Left eye exhibits no discharge. No scleral icterus.  Neck: Neck supple. No JVD present. No tracheal deviation present. No thyromegaly present.  Cardiovascular: Normal rate, regular rhythm, normal heart sounds and intact distal pulses.  Exam reveals no gallop and no friction rub.   No murmur heard. EKG normal   Pulmonary/Chest: Effort normal and breath sounds normal. No respiratory distress. He has no wheezes. He has no rales. He exhibits no tenderness.  Abdominal: Soft. Bowel sounds are normal. He exhibits no distension and no mass. There is no tenderness. There is no rebound and no guarding.  Genitourinary: Rectum normal, prostate normal and penis normal. Guaiac negative stool. No penile tenderness.  Musculoskeletal: Normal range of motion. He exhibits no edema or tenderness.  Lymphadenopathy:    He  has no cervical adenopathy.  Neurological: He is alert and oriented to person, place, and time. He has normal reflexes. No cranial nerve deficit. He exhibits normal muscle tone. Coordination normal.  Skin: Skin is warm and dry. No rash noted. He is not diaphoretic. No erythema. No pallor.  Psychiatric: He has a normal mood and affect. His behavior is normal. Judgment and thought content normal.          Assessment & Plan:  Well exam. We reviewed diet and exercise advice. He will monitor the BP closely at home. Set up fasting labs soon. Switch from Vytorin to Lipitor 40 mg daily.

## 2015-04-24 NOTE — Progress Notes (Signed)
Pre visit review using our clinic review tool, if applicable. No additional management support is needed unless otherwise documented below in the visit note. 

## 2015-04-25 ENCOUNTER — Other Ambulatory Visit (INDEPENDENT_AMBULATORY_CARE_PROVIDER_SITE_OTHER): Payer: Managed Care, Other (non HMO)

## 2015-04-25 DIAGNOSIS — Z0000 Encounter for general adult medical examination without abnormal findings: Secondary | ICD-10-CM | POA: Diagnosis not present

## 2015-04-25 LAB — LIPID PANEL
CHOLESTEROL: 173 mg/dL (ref 0–200)
HDL: 53.2 mg/dL (ref >39.00)
LDL Cholesterol: 97 mg/dL (ref 0–99)
NONHDL: 119.91
Total CHOL/HDL Ratio: 3
Triglycerides: 116 mg/dL (ref 0.0–149.0)
VLDL: 23.2 mg/dL (ref 0.0–40.0)

## 2015-04-25 LAB — CBC WITH DIFFERENTIAL/PLATELET
BASOS PCT: 0.7 % (ref 0.0–3.0)
Basophils Absolute: 0 10*3/uL (ref 0.0–0.1)
EOS PCT: 1.4 % (ref 0.0–5.0)
Eosinophils Absolute: 0.1 10*3/uL (ref 0.0–0.7)
HCT: 46.2 % (ref 39.0–52.0)
Hemoglobin: 15.5 g/dL (ref 13.0–17.0)
LYMPHS ABS: 1.3 10*3/uL (ref 0.7–4.0)
Lymphocytes Relative: 21.1 % (ref 12.0–46.0)
MCHC: 33.5 g/dL (ref 30.0–36.0)
MCV: 97.6 fl (ref 78.0–100.0)
MONO ABS: 0.5 10*3/uL (ref 0.1–1.0)
MONOS PCT: 8.1 % (ref 3.0–12.0)
NEUTROS ABS: 4.4 10*3/uL (ref 1.4–7.7)
NEUTROS PCT: 68.7 % (ref 43.0–77.0)
Platelets: 216 10*3/uL (ref 150.0–400.0)
RBC: 4.73 Mil/uL (ref 4.22–5.81)
RDW: 12.9 % (ref 11.5–15.5)
WBC: 6.4 10*3/uL (ref 4.0–10.5)

## 2015-04-25 LAB — BASIC METABOLIC PANEL
BUN: 21 mg/dL (ref 6–23)
CALCIUM: 9.8 mg/dL (ref 8.4–10.5)
CHLORIDE: 106 meq/L (ref 96–112)
CO2: 26 meq/L (ref 19–32)
Creatinine, Ser: 0.97 mg/dL (ref 0.40–1.50)
GFR: 83.58 mL/min (ref >60.00)
GLUCOSE: 117 mg/dL — AB (ref 70–99)
Potassium: 5.4 mEq/L — ABNORMAL HIGH (ref 3.5–5.1)
SODIUM: 141 meq/L (ref 135–145)

## 2015-04-25 LAB — PSA: PSA: 1.08 ng/mL (ref 0.10–4.00)

## 2015-04-25 LAB — POCT URINALYSIS DIPSTICK
BILIRUBIN UA: NEGATIVE
GLUCOSE UA: NEGATIVE
KETONES UA: NEGATIVE
Leukocytes, UA: NEGATIVE
Nitrite, UA: NEGATIVE
RBC UA: NEGATIVE
SPEC GRAV UA: 1.025
Urobilinogen, UA: 0.2
pH, UA: 5.5

## 2015-04-25 LAB — HEPATIC FUNCTION PANEL
ALK PHOS: 56 U/L (ref 39–117)
ALT: 30 U/L (ref 0–53)
AST: 20 U/L (ref 0–37)
Albumin: 4.9 g/dL (ref 3.5–5.2)
BILIRUBIN DIRECT: 0.2 mg/dL (ref 0.0–0.3)
BILIRUBIN TOTAL: 1.3 mg/dL — AB (ref 0.2–1.2)
TOTAL PROTEIN: 7.2 g/dL (ref 6.0–8.3)

## 2015-04-25 LAB — TSH: TSH: 2.87 u[IU]/mL (ref 0.35–4.50)

## 2015-07-25 ENCOUNTER — Ambulatory Visit (INDEPENDENT_AMBULATORY_CARE_PROVIDER_SITE_OTHER): Payer: Managed Care, Other (non HMO) | Admitting: Family Medicine

## 2015-07-25 ENCOUNTER — Encounter: Payer: Self-pay | Admitting: Family Medicine

## 2015-07-25 VITALS — BP 132/78 | HR 74 | Temp 98.7°F | Ht 69.0 in | Wt 182.0 lb

## 2015-07-25 DIAGNOSIS — S46911A Strain of unspecified muscle, fascia and tendon at shoulder and upper arm level, right arm, initial encounter: Secondary | ICD-10-CM

## 2015-07-25 NOTE — Progress Notes (Signed)
Pre visit review using our clinic review tool, if applicable. No additional management support is needed unless otherwise documented below in the visit note. 

## 2015-07-25 NOTE — Progress Notes (Signed)
   Subjective:    Patient ID: Michael Lambert, male    DOB: 1955/08/28, 60 y.o.   MRN: [id number]  HPI Here for 3 days of a sharp pain in the right armpit that is actually improved today. No trauma that he knows of but he does work out daily, including swimming and biking. Advil helps. No pain in the neck or shoulder. No pain or numbnss down the arm.    Review of Systems  Constitutional: Negative.   Respiratory: Negative.   Cardiovascular: Negative.   Musculoskeletal: Positive for myalgias.       Objective:   Physical Exam  Constitutional: He appears well-developed and well-nourished.  Cardiovascular: Normal rate, regular rhythm, normal heart sounds and intact distal pulses.   Pulmonary/Chest: Effort normal and breath sounds normal.  Musculoskeletal:  He is tender in the right axilla at the proximal tendons of the arm adductors. No masses or swelling. Full ROM           Assessment & Plan:  Muscle strain. Rest and Advil prn. This should resolve within a week.

## 2015-12-14 ENCOUNTER — Ambulatory Visit (INDEPENDENT_AMBULATORY_CARE_PROVIDER_SITE_OTHER): Payer: Managed Care, Other (non HMO) | Admitting: Family Medicine

## 2015-12-14 ENCOUNTER — Encounter: Payer: Self-pay | Admitting: Family Medicine

## 2015-12-14 VITALS — BP 155/89 | HR 73 | Temp 98.8°F | Ht 69.0 in | Wt 183.0 lb

## 2015-12-14 DIAGNOSIS — S39012A Strain of muscle, fascia and tendon of lower back, initial encounter: Secondary | ICD-10-CM | POA: Diagnosis not present

## 2015-12-14 DIAGNOSIS — E785 Hyperlipidemia, unspecified: Secondary | ICD-10-CM | POA: Diagnosis not present

## 2015-12-14 LAB — HEPATIC FUNCTION PANEL
ALBUMIN: 5 g/dL (ref 3.5–5.2)
ALK PHOS: 55 U/L (ref 39–117)
ALT: 26 U/L (ref 0–53)
AST: 15 U/L (ref 0–37)
Bilirubin, Direct: 0.1 mg/dL (ref 0.0–0.3)
TOTAL PROTEIN: 7.8 g/dL (ref 6.0–8.3)
Total Bilirubin: 0.8 mg/dL (ref 0.2–1.2)

## 2015-12-14 LAB — LIPID PANEL
Cholesterol: 187 mg/dL (ref 0–200)
HDL: 49.5 mg/dL (ref >39.00)
LDL Cholesterol: 106 mg/dL — ABNORMAL HIGH (ref 0–99)
NonHDL: 137.98
TRIGLYCERIDES: 161 mg/dL — AB (ref 0.0–149.0)
Total CHOL/HDL Ratio: 4
VLDL: 32.2 mg/dL (ref 0.0–40.0)

## 2015-12-14 MED ORDER — METHYLPREDNISOLONE 4 MG PO TBPK: ORAL_TABLET | ORAL | 0 refills | Status: DC

## 2015-12-14 MED ORDER — CYCLOBENZAPRINE HCL 10 MG PO TABS: 10.0000 mg | ORAL_TABLET | Freq: Three times a day (TID) | ORAL | 2 refills | Status: DC | PRN
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
## 2015-12-14 NOTE — Progress Notes (Signed)
Pre visit review using our clinic review tool, if applicable. No additional management support is needed unless otherwise documented below in the visit note. 

## 2015-12-21 ENCOUNTER — Encounter: Payer: Self-pay | Admitting: Family Medicine

## 2015-12-21 ENCOUNTER — Ambulatory Visit (INDEPENDENT_AMBULATORY_CARE_PROVIDER_SITE_OTHER)
Admission: RE | Admit: 2015-12-21 | Discharge: 2015-12-21 | Disposition: A | Discharge status: Home / Self Care | Payer: Managed Care, Other (non HMO) | Source: Ambulatory Visit | Attending: Family Medicine | Admitting: Family Medicine

## 2015-12-21 ENCOUNTER — Ambulatory Visit (INDEPENDENT_AMBULATORY_CARE_PROVIDER_SITE_OTHER): Payer: Managed Care, Other (non HMO) | Admitting: Family Medicine

## 2015-12-21 VITALS — BP 138/82 | HR 80 | Temp 98.4°F | Ht 69.0 in | Wt 183.0 lb

## 2015-12-21 DIAGNOSIS — M545 Low back pain, unspecified: Secondary | ICD-10-CM

## 2015-12-21 MED ORDER — HYDROCODONE-ACETAMINOPHEN 5-325 MG PO TABS: 1.0000 | ORAL_TABLET | Freq: Four times a day (QID) | ORAL | 0 refills | Status: DC | PRN
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
## 2015-12-21 NOTE — Progress Notes (Signed)
Pre visit review using our clinic review tool, if applicable. No additional management support is needed unless otherwise documented below in the visit note. 

## 2015-12-24 ENCOUNTER — Ambulatory Visit: Payer: Managed Care, Other (non HMO) | Admitting: Family Medicine

## 2016-01-07 ENCOUNTER — Encounter: Payer: Self-pay | Admitting: Family Medicine

## 2016-01-07 ENCOUNTER — Telehealth: Payer: Self-pay | Admitting: Family Medicine

## 2016-01-07 ENCOUNTER — Ambulatory Visit (INDEPENDENT_AMBULATORY_CARE_PROVIDER_SITE_OTHER): Payer: Managed Care, Other (non HMO) | Admitting: Family Medicine

## 2016-01-07 VITALS — BP 138/90 | HR 72 | Resp 12 | Ht 69.0 in | Wt 181.1 lb

## 2016-01-07 DIAGNOSIS — R002 Palpitations: Secondary | ICD-10-CM

## 2016-01-07 DIAGNOSIS — E875 Hyperkalemia: Secondary | ICD-10-CM

## 2016-01-07 DIAGNOSIS — R030 Elevated blood-pressure reading, without diagnosis of hypertension: Secondary | ICD-10-CM

## 2016-01-07 NOTE — Telephone Encounter (Signed)
Patient Name: Michael Lambert  DOB: 27-Sep-1955    Initial Comment Caller needs an appt to see Dr Sarajane Jews. The caller has had the heart palpitations at night. Caller's BP was 150/90 and pulse was 100-125    Nurse Assessment  Nurse: Raphael Gibney, RN, Vanita Ingles Date/Time (Eastern Time): 01/07/2016 12:53:01 PM  Confirm and document reason for call. If symptomatic, describe symptoms. You must click the next button to save text entered. ---Caller states he woke up and had heart palpitations during the night. Has happened the past 2 nights. Heart beat feels irregular. BP 150/90 during the episode and heart rate was 100-125. Not happening now. No chest pain or SOB.  Has the patient traveled out of the country within the last 30 days? ---Not Applicable  Does the patient have any new or worsening symptoms? ---Yes  Will a triage be completed? ---Yes  Related visit to physician within the last 2 weeks? ---No  Does the PT have any chronic conditions? (i.e. diabetes, asthma, etc.) ---Yes  List chronic conditions. ---GERD  Is this a behavioral health or substance abuse call? ---No     Guidelines    Guideline Title Affirmed Question Affirmed Notes  Heart Rate and Heartbeat Questions [1] Skipped or extra beat(s) AND [2] occurs 4 or more times per minute    Final Disposition User   See Physician within 4 Hours (or PCP triage) Raphael Gibney, RN, Vanita Ingles    Comments  appt scheduled for 3:30 pm 01/07/2016 with Dr. Betty Martinique   Referrals  REFERRED TO PCP OFFICE   Disagree/Comply: Comply

## 2016-01-07 NOTE — Progress Notes (Signed)
HPI:  ACUTE VISIT:  Chief Complaint  Patient presents with  . Palpitations    2 nights, no chest discomfort, pulse high    Michael Lambert is a 60 y.o. male, who is here today complaining of 2 days of "fluttery" chest with irregular HR.  He states that he has had episodes when he feels like his heart is skipping a beat occasionally but this is different. He checked pulse during episodes and noted elevated HR and mild irregularity. No associated symptoms.  Usually happens at night when he is in bed, lasts about 10 min, yesterday it lasted longer. BP during episode 150's/90's. Usually his BP is 140/80's, denies Hx of HTN. HR 96-129/min. He has not identified exacerbating or alleviating factors.  + Increased stress lately. Denies OTC new medications or illicit drug use. + Former smoker. + daily alcohol intake: 2 beers and 2 8 oz of wine.  + HLD. Hx of anxiety but denies feeling anxious with episodes.  FHx negative for CAD.    Denies severe/frequent headache, visual changes, chest pain, dyspnea, diaphoresis, claudication, focal weakness, or edema.  He exercises regularly, he denies any chest discomfort or palpitations with exertion.  Reviewing labs done in 04/2015 K+ mildly elevated.  Lab Results  Component Value Date   CREATININE 0.97 04/25/2015   BUN 21 04/25/2015   NA 141 04/25/2015   K 5.4 (H) 04/25/2015   CL 106 04/25/2015   CO2 26 04/25/2015   Lab Results  Component Value Date   TSH 2.87 04/25/2015     Review of Systems  Constitutional: Negative for activity change, appetite change, fatigue, fever and unexpected weight change.  HENT: Negative for nosebleeds, sore throat and trouble swallowing.   Eyes: Negative for redness and visual disturbance.  Respiratory: Negative for apnea, cough, shortness of breath and wheezing.   Cardiovascular: Positive for palpitations. Negative for chest pain and leg swelling.  Gastrointestinal: Negative for abdominal  pain, nausea and vomiting.       No changes in bowel habits.  Endocrine: Negative for cold intolerance and heat intolerance.  Genitourinary: Negative for decreased urine volume, difficulty urinating and hematuria.  Skin: Negative for pallor and rash.  Neurological: Negative for dizziness, seizures, syncope, weakness, numbness and headaches.  Psychiatric/Behavioral: Negative for confusion. The patient is nervous/anxious.       Current Outpatient Prescriptions on File Prior to Visit  Medication Sig Dispense Refill  . atorvastatin (LIPITOR) 40 MG tablet Take 1 tablet (40 mg total) by mouth daily. 90 tablet 3  . cyclobenzaprine (FLEXERIL) 10 MG tablet Take 1 tablet (10 mg total) by mouth 3 (three) times daily as needed for muscle spasms. 60 tablet 2  . HYDROcodone-acetaminophen (NORCO) 5-325 MG tablet Take 1 tablet by mouth every 6 (six) hours as needed for moderate pain. 30 tablet 0  . omeprazole (PRILOSEC) 20 MG capsule Take 20 mg by mouth daily.       No current facility-administered medications on file prior to visit.      Past Medical History:  Diagnosis Date  . Anxiety    sees Dr. Pearson Grippe   . Benign prostatic hypertrophy   . GERD (gastroesophageal reflux disease)   . Hyperlipidemia   . Migraine    No Known Allergies  Social History   Social History  . Marital status: Married    Spouse name: N/A  . Number of children: N/A  . Years of education: N/A   Social History Main Topics  .  Smoking status: Former Smoker    Quit date: 05/19/1980  . Smokeless tobacco: Never Used  . Alcohol use 0.0 oz/week     Comment: 4 glasses of wine each day  . Drug use: No  . Sexual activity: Not Asked   Other Topics Concern  . None   Social History Narrative  . None    Vitals:   01/07/16 1535  BP: 138/90  Pulse: 72  Resp: 12   Body mass index is 26.75 kg/m.  O2 sat at RA 98%.     Physical Exam  Constitutional: He is oriented to person, place, and time. He appears  well-developed. No distress.  HENT:  Head: Atraumatic.  Mouth/Throat: Oropharynx is clear and moist and mucous membranes are normal.  Eyes: Conjunctivae and EOM are normal. Pupils are equal, round, and reactive to light.  Neck: Thyromegaly (borderline) present. No thyroid mass present.  Cardiovascular: Normal rate and regular rhythm.   No murmur heard. Pulses:      Dorsalis pedis pulses are 2+ on the right side, and 2+ on the left side.  Respiratory: Effort normal and breath sounds normal. No respiratory distress.  Musculoskeletal: He exhibits no edema.  Lymphadenopathy:    He has no cervical adenopathy.  Neurological: He is alert and oriented to person, place, and time. He has normal strength. Coordination and gait normal.  Skin: Skin is warm. No erythema.  Psychiatric: His speech is normal. His mood appears anxious.  Well groomed, good eye contact.      ASSESSMENT AND PLAN:     Jehiel was seen today for palpitations.  Diagnoses and all orders for this visit:  Palpitations  We discussed possible causes: PAC's,PVC's, and atrial fib among some. Given risk factors the likelihood of CVD is low but no zero. Holter vs even monitor may be necessary but he prefers to hold on cardiology evaluation. EKG today otherwise negative. Compared with EKG done 03/2014 no significant changes, no signs of ischemia, ? RAE. BB may need to be considered if he has frequent episodes. Decrease caffeine intake.  Further recommendations will be given according to lab results, clearly instructed about warning signs. TSH has been checked in the past, last in 04/2016. F/U in 3 weeks, before if needed.   -     EKG 12-Lead -     Basic metabolic panel -     Magnesium  Hyperkalemia  Further recommendations will be given according to lab results.  -     Basic metabolic panel  Elevated blood pressure (not hypertension)  BP readings at home SBP 140-150's and DBP 85-102, most in 90's. He prefers to  hold on antihypertensive medication. Decrease alcohol intake. For now he will continue monitoring BP at home and bring readings to next office visit.       Return in about 3 weeks (around 01/28/2016) for PCP for palpitations/BP.     -Mr.Jadavion Collar was advised to return or notify a doctor immediately if symptoms worsen or persist or new concerns arise, he voices understanding.       Terah Robey G. Martinique, MD  [facility]. [place] office.

## 2016-01-07 NOTE — Telephone Encounter (Signed)
Noted  

## 2016-01-07 NOTE — Patient Instructions (Addendum)
   [name] I have seen you today for an acute visit because your primary care provider was not available. Monitor for signs of worsening symptoms and seek immediate medical attention if any concerning/warning symptom as we discussed.    A few things to remember from today's visit:   Palpitations - Plan: EKG [id number], Basic metabolic panel, Magnesium  Hyperkalemia - Plan: Basic metabolic panel  Elevated blood pressure (not hypertension)  Continue monitoring blood pressure and pulse.  We hold on cardiology evaluation but if it continues happening daily please let me know from my chart  We have ordered labs or studies at this visit.  It can take up to 1-2 weeks for results and processing. IF results require follow up or explanation, we will call you with instructions. Clinically stable results will be released to your [facility]. If you have not heard from [place] or cannot find your results in [facility] in 2 weeks please contact our office at [phone].  If you are not yet signed up for [facility], please consider signing up   Please be sure medication list is accurate. If a new problem present, please set up appointment sooner than planned today.

## 2016-01-08 ENCOUNTER — Encounter: Payer: Self-pay | Admitting: Family Medicine

## 2016-01-08 LAB — BASIC METABOLIC PANEL
BUN: 15 mg/dL (ref 6–23)
CHLORIDE: 104 meq/L (ref 96–112)
CO2: 25 mEq/L (ref 19–32)
CREATININE: 0.94 mg/dL (ref 0.40–1.50)
Calcium: 9.4 mg/dL (ref 8.4–10.5)
GFR: 86.46 mL/min (ref >60.00)
GLUCOSE: 98 mg/dL (ref 70–99)
POTASSIUM: 4.1 meq/L (ref 3.5–5.1)
Sodium: 138 mEq/L (ref 135–145)

## 2016-01-08 LAB — MAGNESIUM: Magnesium: 2 mg/dL (ref 1.5–2.5)

## 2016-01-28 ENCOUNTER — Ambulatory Visit (INDEPENDENT_AMBULATORY_CARE_PROVIDER_SITE_OTHER): Payer: Managed Care, Other (non HMO) | Admitting: Family Medicine

## 2016-01-28 ENCOUNTER — Encounter: Payer: Self-pay | Admitting: Family Medicine

## 2016-01-28 VITALS — BP 154/82 | HR 81 | Temp 98.3°F | Ht 69.0 in | Wt 197.8 lb

## 2016-01-28 DIAGNOSIS — Z23 Encounter for immunization: Secondary | ICD-10-CM | POA: Diagnosis not present

## 2016-01-28 DIAGNOSIS — F419 Anxiety disorder, unspecified: Secondary | ICD-10-CM

## 2016-01-28 DIAGNOSIS — R002 Palpitations: Secondary | ICD-10-CM | POA: Diagnosis not present

## 2016-01-28 NOTE — Progress Notes (Signed)
   Subjective:    Patient ID: Michael Lambert, male    DOB: 1956-02-27, 60 y.o.   MRN: [id number]  HPI Here to discuss lingering palpitations which almost always bother him at night. These started about a month ago. He has had one episode during the day when he was quietly sitting, but he usually notices them lying in bed. No chest pain or SOB. He was seen a few weeks ago with a normal EKG and labs. He thinks it may be related to anxiety. He had some Zoloft 25 mg tablets at his house and started taking these a week ago. They only cause side effects (urinary hesitancy) and no good effects.    Review of Systems  Constitutional: Negative.   Respiratory: Negative.   Cardiovascular: Positive for palpitations. Negative for chest pain and leg swelling.  Gastrointestinal: Negative.   Neurological: Negative.   Psychiatric/Behavioral: Negative for confusion, decreased concentration, dysphoric mood and hallucinations. The patient is nervous/anxious.        Objective:   Physical Exam  Constitutional: He is oriented to person, place, and time. He appears well-developed and well-nourished. No distress.  Cardiovascular: Normal rate, regular rhythm, normal heart sounds and intact distal pulses.   Pulmonary/Chest: Effort normal and breath sounds normal.  Neurological: He is alert and oriented to person, place, and time.  Psychiatric: He has a normal mood and affect. His behavior is normal. Thought content normal.          Assessment & Plan:  Palpitations, that are likely the result of anxiety. For peace of mind I will refer him to Cardiology to evaluate. He will stop the Zoloft. We agreed that if the Cardiology work up is unrevealing he will see me again to think about a different way to treat the anxiety. His BP is slightly high as well, so it may be of benefit to start him on a low dose beta blocker. We will leave this up tho Cardiology.  Laurey Morale, MD

## 2016-02-06 ENCOUNTER — Encounter: Payer: Self-pay | Admitting: Family Medicine

## 2016-02-06 DIAGNOSIS — R002 Palpitations: Secondary | ICD-10-CM

## 2016-02-08 NOTE — Telephone Encounter (Signed)
I am not sure what happened but I put in another referral to Cardiology

## 2016-02-27 ENCOUNTER — Encounter: Payer: Self-pay | Admitting: Cardiovascular Disease

## 2016-02-27 ENCOUNTER — Ambulatory Visit (INDEPENDENT_AMBULATORY_CARE_PROVIDER_SITE_OTHER): Payer: Managed Care, Other (non HMO) | Admitting: Cardiovascular Disease

## 2016-02-27 VITALS — BP 138/91 | HR 74 | Ht 69.0 in | Wt 178.4 lb

## 2016-02-27 DIAGNOSIS — R002 Palpitations: Secondary | ICD-10-CM | POA: Diagnosis not present

## 2016-02-27 DIAGNOSIS — I10 Essential (primary) hypertension: Secondary | ICD-10-CM

## 2016-02-27 DIAGNOSIS — E782 Mixed hyperlipidemia: Secondary | ICD-10-CM

## 2016-02-27 DIAGNOSIS — R012 Other cardiac sounds: Secondary | ICD-10-CM

## 2016-02-27 DIAGNOSIS — Z789 Other specified health status: Secondary | ICD-10-CM | POA: Diagnosis not present

## 2016-02-27 DIAGNOSIS — Z7289 Other problems related to lifestyle: Secondary | ICD-10-CM

## 2016-02-27 MED ORDER — METOPROLOL SUCCINATE ER 25 MG PO TB24: 12.5000 mg | ORAL_TABLET | Freq: Every day | ORAL | 3 refills | Status: DC

## 2016-02-27 NOTE — Progress Notes (Signed)
Cardiology Consultation Note    Date:  02/27/2016   ID:  Timithy Dohn, DOB Dec 25, 1955, MRN [id number]  PCP:  Laurey Morale, MD  Cardiologist:   Sanda Klein, MD   Chief Complaint  Patient presents with  . New Patient (Initial Visit)    History of Present Illness:  Michael Lambert is a 60 y.o. male gout known cardiac illness, presenting with complaints of palpitations. These began about 3 weeks ago. They occur consistently only at night. Most often he has isolated skipped beats, but at least on one occasion he had a completely erratic rhythm with a rate of around 125 bpm, without any detectable regular baseline. He was under a lot of emotional stress at the time and the palpitations happen nightly for about 2 weeks, but have subsequently subsided. He believes they might be anxiety related.  Jadore is otherwise very physically active and fit. He swims or rises mountain bike 6 days a week, between 30 minutes and 2 hours at a time. He is minimally overweight. During Physical activity he never experienced the palpitations and has not had problems with dyspnea, angina, syncope, focal neurological complaints, claudication or leg edema.  He reports not having hypertension, but his blood pressure has often been "borderline". He has high cholesterol and takes a statin. He has gastroesophageal reflux disease that is well controlled with omeprazole.  He does not have a family history of cardiac disease. His brother also has high cholesterol but has not had coronary other vascular problems by the age of 77. Unfortunately both Deanna's parents died relatively young from ovarian cancer and multiple sclerosis respectively.  Roger drinks alcohol every day on average of 20 drinks a week. He also drinks at least 45 caffeinated beverages (coffee or diet soda) daily.  His electrocardiogram shows normal sinus rhythm without any ectopic beats but it does have some subtle abnormalities. There are borderline voltage  criteria for LVH and subtle ST segment and T-wave changes that suggest secondary repolarization abnormalities.    Past Medical History:  Diagnosis Date  . Anxiety    sees Dr. Pearson Grippe   . Benign prostatic hypertrophy   . GERD (gastroesophageal reflux disease)   . Hyperlipidemia   . Migraine     Past Surgical History:  Procedure Laterality Date  . APPENDECTOMY    . colonoscopy  10-08-12   per Dr. Deatra Ina, adenomatous polyps, repeat in 5 yrs   . CYSTOSCOPY     per Dr. Rosana Hoes  . HERNIA REPAIR  2008   bilateral inguinal, per Dr. Zella Richer  . PALATE / UVULA BIOPSY / EXCISION  2004   per Dr. Lucia Gaskins, for snoring   . RADIAL KERATOTOMY     se[facility] Dr. Mercer Pod at [facility] , bilateral  . repair detached retina    . TONSILLECTOMY      Current Medications: Outpatient Medications Prior to Visit  Medication Sig Dispense Refill  . atorvastatin (LIPITOR) 40 MG tablet Take 1 tablet (40 mg total) by mouth daily. 90 tablet 3  . omeprazole (PRILOSEC) 20 MG capsule Take 20 mg by mouth daily.      . cyclobenzaprine (FLEXERIL) 10 MG tablet Take 1 tablet (10 mg total) by mouth 3 (three) times daily as needed for muscle spasms. (Patient not taking: Reported on 01/28/2016) 60 tablet 2  . HYDROcodone-acetaminophen (NORCO) 5-325 MG tablet Take 1 tablet by mouth every 6 (six) hours as needed for moderate pain. (Patient not taking: Reported on 01/28/2016) 30 tablet 0  No facility-administered medications prior to visit.      Allergies:   Review of patient's allergies indicates no known allergies.   Social History   Social History  . Marital status: Married    Spouse name: N/A  . Number of children: N/A  . Years of education: N/A   Social History Main Topics  . Smoking status: Former Smoker    Quit date: 05/19/1980  . Smokeless tobacco: Never Used  . Alcohol use 0.0 oz/week     Comment: 4 glasses of wine each day  . Drug use: No  . Sexual activity: Not Asked   Other Topics Concern  .  None   Social History Narrative  . None     Family History:  The patient's family history includes Hyperlipidemia in his father.   ROS:   Please see the history of present illness.    ROS All other systems reviewed and are negative.   PHYSICAL EXAM:   VS:  BP (!) 138/91   Pulse 74   Ht 5\' 9"  (1.753 m)   Wt 178 lb 6.4 oz (80.9 kg)   BMI 26.35 kg/m    GEN: Well nourished, well developed, in no acute distress  HEENT: normal  Neck: no JVD, carotid bruits, or masses Cardiac: RRR; no murmurs, rubs, or edema . He has a very distinct fourth heart sound. Respiratory:  clear to auscultation bilaterally, normal work of breathing GI: soft, nontender, nondistended, + BS MS: no deformity or atrophy  Skin: warm and dry, no rash Neuro:  Alert and Oriented x 3, Strength and sensation are intact Psych: euthymic mood, full affect  Wt Readings from Last 3 Encounters:  02/27/16 178 lb 6.4 oz (80.9 kg)  01/28/16 197 lb 12.8 oz (89.7 kg)  01/07/16 181 lb 2 oz (82.2 kg)      Studies/Labs Reviewed:   EKG:  EKG is ordered today.  The ekg ordered today demonstrates Normal sinus rhythm, borderline voltage criteria for LVH, slight ST segment depression and mild T-wave inversion mostly seen in leads V5-V6 and in the inferior leads  Recent Labs: 04/25/2015: Hemoglobin 15.5; Platelets 216.0; TSH 2.87 12/14/2015: ALT 26 01/07/2016: BUN 15; Creatinine, Ser 0.94; Magnesium 2.0; Potassium 4.1; Sodium 138   Lipid Panel    Component Value Date/Time   CHOL 187 12/14/2015 1205   TRIG 161.0 (H) 12/14/2015 1205   HDL 49.50 12/14/2015 1205   CHOLHDL 4 12/14/2015 1205   VLDL 32.2 12/14/2015 1205   LDLCALC 106 (H) 12/14/2015 1205   LDLDIRECT 143.7 08/20/2012 0955    Additional studies/ records that were reviewed today include:  Notes from Dr. Sarajane Jews    ASSESSMENT:    1. Heart palpitations   2. Essential hypertension   3. Abnormal fourth heart sound (S4)   4. Alcohol use   5. Mixed hyperlipidemia        PLAN:  In order of problems listed above:  1. Palpitations: Most of his symptoms are compatible with isolated PACs or PVCs, but it sounds like he is describing at least one episode of possible atrial fibrillation with rapid ventricular response that lasted for a couple of hours. This has not returned them last several weeks and it may be hard to catch it on any type of arrhythmia monitor. I have suggested that he try getting snapshots of his rhythm using an alivecor device. I made it clear to him that this is a different commercially available device that is not "medical grade" will allow  reasonably detailed recording of abnormal rhythms were much more extended period of time. The alternative would be an implantable loop recorder, which I think is excessive. Even if we identify atrial fibrillation, Mattia is in a relatively low embolic risk group and I don't think we would be recommending full anticoagulation. 2. HTN: Larenzo has borderline elevation in both his systolic and diastolic blood pressure today. Apparently this is a recurrent problem. I think he may have true hypertension. We'll start a very low dose of beta blocker since this might provide palliation of palpitations as well as control of blood pressure. In the long run, Ala is very physically active and he may not feel great on a beta blocker, but will give this an initial try. 3. Abnormal cardiac exam: He has a very defined fourth heart sound on exam. This may signify left ventricular hypertrophy, will verify with an echocardiogram. His electrocardiogram also shows subtle changes that probably represent LVH. 4. Alcohol use: I think Requan's current intake of alcohol is probably excessive. We reviewed the relationship between alcohol use and arrhythmia and alcohol use (specially atrial fibrillation) and alcohol's effect on heart disease in general. While I don't think he needs to practice complete abstinence from alcohol, he probably needs to cut  his alcohol intake in half to avoid health consequences. 5. HLP: Parameters are generally close to desirable range with very slight residual hypertriglyceridemia    Medication Adjustments/Labs and Tests Ordered: Current medicines are reviewed at length with the patient today.  Concerns regarding medicines are outlined above.  Medication changes, Labs and Tests ordered today are listed in the Patient Instructions below. Patient Instructions  Medication Instructions: Dr Sallyanne Kuster has recommended making the following medication changes: 1. START Metoprolol Succinate 25 mg tablets - take 0.5 tablet once daily  Labwork: NONE ORDERED  Testing/Procedures: 1. Echocardiogram - Your physician has requested that you have an echocardiogram. Echocardiography is a painless test that uses sound waves to create images of your heart. It provides your doctor with information about the size and shape of your heart and how well your heart's chambers and valves are working. This procedure takes approximately one hour. There are no restrictions for this procedure. This will be performed at our [facility] location - [address], [address].  Follow-up: Dr Sallyanne Kuster recommends that you schedule a follow-up appointment in 3 months.  If you need a refill on your cardiac medications before your next appointment, please call your pharmacy.    Alivecor device - [email or web address]    Signed, Sanda Klein, MD  02/27/2016 2:39 PM    [address], [place], [place]  [address] Phone: [phone]; Fax: [phone]

## 2016-02-27 NOTE — Patient Instructions (Addendum)
Medication Instructions: Dr Sallyanne Kuster has recommended making the following medication changes: 1. START Metoprolol Succinate 25 mg tablets - take 0.5 tablet once daily  Labwork: NONE ORDERED  Testing/Procedures: 1. Echocardiogram - Your physician has requested that you have an echocardiogram. Echocardiography is a painless test that uses sound waves to create images of your heart. It provides your doctor with information about the size and shape of your heart and how well your heart's chambers and valves are working. This procedure takes approximately one hour. There are no restrictions for this procedure. This will be performed at our [facility] location - [address].  Follow-up: Dr Sallyanne Kuster recommends that you schedule a follow-up appointment in 3 months.  If you need a refill on your cardiac medications before your next appointment, please call your pharmacy.    Alivecor device - [email or web address]

## 2016-03-11 ENCOUNTER — Encounter: Payer: Self-pay | Admitting: Cardiovascular Disease

## 2016-03-12 ENCOUNTER — Ambulatory Visit (HOSPITAL_COMMUNITY): Payer: Managed Care, Other (non HMO) | Attending: Cardiovascular Disease

## 2016-03-12 ENCOUNTER — Other Ambulatory Visit: Payer: Self-pay

## 2016-03-12 DIAGNOSIS — I10 Essential (primary) hypertension: Secondary | ICD-10-CM

## 2016-03-12 DIAGNOSIS — R002 Palpitations: Secondary | ICD-10-CM | POA: Diagnosis not present

## 2016-03-12 DIAGNOSIS — E785 Hyperlipidemia, unspecified: Secondary | ICD-10-CM | POA: Insufficient documentation

## 2016-03-12 DIAGNOSIS — Z87891 Personal history of nicotine dependence: Secondary | ICD-10-CM | POA: Insufficient documentation

## 2016-03-14 NOTE — Telephone Encounter (Signed)
Pt returning call regarding Echo results-pls call [phone] after 930am

## 2016-03-17 ENCOUNTER — Telehealth: Payer: Self-pay | Admitting: Cardiovascular Disease

## 2016-03-17 ENCOUNTER — Encounter: Payer: Self-pay | Admitting: Cardiovascular Disease

## 2016-03-17 NOTE — Telephone Encounter (Signed)
Returning your call. °

## 2016-03-19 NOTE — Telephone Encounter (Signed)
Called patient with results. Patient verbalized understanding and agreed with plan.

## 2016-04-14 ENCOUNTER — Other Ambulatory Visit: Payer: Self-pay | Admitting: Family Medicine

## 2016-04-23 ENCOUNTER — Ambulatory Visit (INDEPENDENT_AMBULATORY_CARE_PROVIDER_SITE_OTHER): Payer: Managed Care, Other (non HMO) | Admitting: Family Medicine

## 2016-04-23 ENCOUNTER — Encounter: Payer: Self-pay | Admitting: Family Medicine

## 2016-04-23 VITALS — BP 127/73 | HR 59 | Temp 98.4°F | Ht 69.0 in | Wt 179.0 lb

## 2016-04-23 DIAGNOSIS — Z209 Contact with and (suspected) exposure to unspecified communicable disease: Secondary | ICD-10-CM | POA: Diagnosis not present

## 2016-04-23 DIAGNOSIS — R197 Diarrhea, unspecified: Secondary | ICD-10-CM | POA: Diagnosis not present

## 2016-04-23 LAB — CBC WITH DIFFERENTIAL/PLATELET
Basophils Absolute: 0 10*3/uL (ref 0.0–0.1)
Basophils Relative: 1 % (ref 0.0–3.0)
EOS PCT: 2.5 % (ref 0.0–5.0)
Eosinophils Absolute: 0.1 10*3/uL (ref 0.0–0.7)
HCT: 41.6 % (ref 39.0–52.0)
Hemoglobin: 14.3 g/dL (ref 13.0–17.0)
LYMPHS ABS: 1.8 10*3/uL (ref 0.7–4.0)
Lymphocytes Relative: 36 % (ref 12.0–46.0)
MCHC: 34.3 g/dL (ref 30.0–36.0)
MCV: 96.5 fl (ref 78.0–100.0)
MONOS PCT: 8.2 % (ref 3.0–12.0)
Monocytes Absolute: 0.4 10*3/uL (ref 0.1–1.0)
NEUTROS PCT: 52.3 % (ref 43.0–77.0)
Neutro Abs: 2.6 10*3/uL (ref 1.4–7.7)
Platelets: 187 10*3/uL (ref 150.0–400.0)
RBC: 4.31 Mil/uL (ref 4.22–5.81)
RDW: 12.6 % (ref 11.5–15.5)
WBC: 5.1 10*3/uL (ref 4.0–10.5)

## 2016-04-23 LAB — TSH: TSH: 2.29 u[IU]/mL (ref 0.35–4.50)

## 2016-04-23 LAB — BASIC METABOLIC PANEL
BUN: 19 mg/dL (ref 6–23)
CALCIUM: 9.2 mg/dL (ref 8.4–10.5)
CO2: 27 mEq/L (ref 19–32)
Chloride: 106 mEq/L (ref 96–112)
Creatinine, Ser: 0.93 mg/dL (ref 0.40–1.50)
GFR: 87.45 mL/min (ref >60.00)
Glucose, Bld: 101 mg/dL — ABNORMAL HIGH (ref 70–99)
Potassium: 4.5 mEq/L (ref 3.5–5.1)
SODIUM: 139 meq/L (ref 135–145)

## 2016-04-23 LAB — HEPATIC FUNCTION PANEL
ALBUMIN: 4.5 g/dL (ref 3.5–5.2)
ALT: 26 U/L (ref 0–53)
AST: 16 U/L (ref 0–37)
Alkaline Phosphatase: 52 U/L (ref 39–117)
Bilirubin, Direct: 0.1 mg/dL (ref 0.0–0.3)
TOTAL PROTEIN: 6.6 g/dL (ref 6.0–8.3)
Total Bilirubin: 0.8 mg/dL (ref 0.2–1.2)

## 2016-04-23 MED ORDER — DICYCLOMINE HCL 20 MG PO TABS: 20.0000 mg | ORAL_TABLET | Freq: Three times a day (TID) | ORAL | 2 refills | Status: DC

## 2016-04-23 NOTE — Progress Notes (Signed)
   Subjective:    Patient ID: Michael Lambert, male    DOB: [birth year], 60 y.o.   MRN: [id number]  HPI Here for 3 months of daily diarrhea. The stools are usually watery but are sometimes pasty. He averages 2-3 a day. He has quite a bit of urgency with them and he passes a lot of flatus with them. No abdominal pain or fever. He has never seen blood on the stools. When these started he had not been traveling nor had he been taking antibiotics. His appetite is good and his weight has been stable. No nausea or vomiting. He has been using Imodium with mixed results.   Review of Systems  Constitutional: Negative.   Respiratory: Negative.   Cardiovascular: Negative.   Gastrointestinal: Positive for diarrhea. Negative for abdominal distention, abdominal pain, anal bleeding, blood in stool, constipation, nausea, rectal pain and vomiting.  Musculoskeletal: Negative.   Neurological: Negative.        Objective:   Physical Exam  Constitutional: He appears well-nourished. No distress.  Neck: No thyromegaly present.  Cardiovascular: Normal rate, regular rhythm, normal heart sounds and intact distal pulses.   Pulmonary/Chest: Effort normal and breath sounds normal.          Assessment & Plan:  Chronic diarrhea. We will get labs to [facility] for infectious etiologies, etc. Use Dicyclomine TID.  Laurey Morale, MD

## 2016-04-23 NOTE — Progress Notes (Signed)
Pre visit review using our clinic review tool, if applicable. No additional management support is needed unless otherwise documented below in the visit note. 

## 2016-04-24 LAB — HEPATITIS C ANTIBODY: HCV AB: NEGATIVE

## 2016-04-24 LAB — CLOSTRIDIUM DIFFICILE BY PCR: Toxigenic C. Difficile by PCR: NOT DETECTED

## 2016-04-27 LAB — STOOL CULTURE

## 2016-05-01 ENCOUNTER — Encounter: Payer: Self-pay | Admitting: Family Medicine

## 2016-05-01 DIAGNOSIS — R197 Diarrhea, unspecified: Secondary | ICD-10-CM

## 2016-05-02 NOTE — Telephone Encounter (Signed)
I did a referral to see Dr. Cristina Gong

## 2016-05-13 ENCOUNTER — Other Ambulatory Visit: Payer: Managed Care, Other (non HMO)

## 2016-05-13 ENCOUNTER — Other Ambulatory Visit (INDEPENDENT_AMBULATORY_CARE_PROVIDER_SITE_OTHER): Payer: Managed Care, Other (non HMO)

## 2016-05-13 ENCOUNTER — Encounter: Payer: Self-pay | Admitting: Gastroenterology

## 2016-05-13 ENCOUNTER — Ambulatory Visit (INDEPENDENT_AMBULATORY_CARE_PROVIDER_SITE_OTHER): Payer: Managed Care, Other (non HMO) | Admitting: Gastroenterology

## 2016-05-13 VITALS — BP 126/70 | HR 60 | Ht 69.0 in | Wt 181.0 lb

## 2016-05-13 DIAGNOSIS — K219 Gastro-esophageal reflux disease without esophagitis: Secondary | ICD-10-CM

## 2016-05-13 DIAGNOSIS — K529 Noninfective gastroenteritis and colitis, unspecified: Secondary | ICD-10-CM

## 2016-05-13 LAB — IGA: IgA: 117 mg/dL (ref 68–378)

## 2016-05-13 NOTE — Patient Instructions (Signed)
If you are age 62 or older, your body mass index should be between 23-30. Your Body mass index is 26.73 kg/m. If this is out of the aforementioned range listed, please consider follow up with your Primary Care Provider.  If you are age 66 or younger, your body mass index should be between 19-25. Your Body mass index is 26.73 kg/m. If this is out of the aformentioned range listed, please consider follow up with your Primary Care Provider.   Your physician has requested that you go to the basement for the following lab work before leaving today:  IGA, TTG, Stool O+P, Lactoferrin/WBC  No NSAIDS  Please use Immodium as needed.  We will contact [facility] for your records.  Thank you.

## 2016-05-13 NOTE — Progress Notes (Signed)
HPI :  60 y/o male with a history of anxiety/GERD/hyperlipidemia, here to establish care with me for symptoms of bowel habit changes and reflux. Last visit on 10/08/2012 wih Dr. Deatra Ina.   He reports a history of GERD with hiatal hernia, noted on remote endoscopy with Dr. Cristina Gong of [facility], report not available today. Taking prilosec 20mg  nightly for symptoms. He reports this works well and controls his symptoms in general without much breakthrough, although in recent weeks having some increased mild breakthrough. No dysphagia. He S about long-term risks of PPIs given what has been in the news lately.  He reports some ongoing loose stools which have persisted over 3-6 months. Stools are very loose, not much form, often liquid. He has increased gas and bloating. Since it started symptoms have been variable. He is having at least 2-3 bowel movements per day. He does have some urgency with this periodically. He denies nocturnal symptoms. He denies much abdominal pains, just some discomfort with the gas which has been associated with this. No blood in the stools he has noted. He was given some bentyl and had some stool studies which has not helped too much. He denies any recent antibiotic use. He has started taking immodium which has helped but not resolved. Prior to 3 months ago he has not had problems with this before. He reports stable symptoms since it started. He's had negative C. difficile testing and stool culture.  He has been taking some NSAIDs due to muscle soreness following workouts. He will take motrin or aleve several times per week.   He denies any FH of Crohns / colitis or celiac disease.   Colonoscopy 10/08/2012 - small cecal adenoma, otherwise normal   Past Medical History:  Diagnosis Date  . Anxiety    sees Dr. Pearson Grippe   . Benign prostatic hypertrophy   . GERD (gastroesophageal reflux disease)   . Hyperlipidemia   . Migraine      Past Surgical History:  Procedure  Laterality Date  . APPENDECTOMY    . colonoscopy  10-08-12   per Dr. Deatra Ina, adenomatous polyps, repeat in 5 yrs   . CYSTOSCOPY     per Dr. Rosana Hoes  . HERNIA REPAIR  2008   bilateral inguinal, per Dr. Zella Richer  . PALATE / UVULA BIOPSY / EXCISION  2004   per Dr. Lucia Gaskins, for snoring   . RADIAL KERATOTOMY     sees Dr. Mercer Pod at [facility] , bilateral  . repair detached retina    . TONSILLECTOMY     Family History  Problem Relation Age of Onset  . Multiple sclerosis      family hx  . Hyperlipidemia Father   . Colon cancer Neg Hx   . Esophageal cancer Neg Hx   . Rectal cancer Neg Hx   . Stomach cancer Neg Hx    Social History  Substance Use Topics  . Smoking status: Former Smoker    Quit date: 05/19/1980  . Smokeless tobacco: Never Used  . Alcohol use 0.0 oz/week     Comment: 4 glasses of wine each day   Current Outpatient Prescriptions  Medication Sig Dispense Refill  . atorvastatin (LIPITOR) 40 MG tablet TAKE 1 TABLET BY MOUTH ONCE DAILY 30 tablet 2  . dicyclomine (BENTYL) 20 MG tablet Take 1 tablet (20 mg total) by mouth 3 (three) times daily. 90 tablet 2  . metoprolol succinate (TOPROL-XL) 25 MG 24 hr tablet Take 0.5 tablets (12.5 mg total)  by mouth daily. 45 tablet 3  . omeprazole (PRILOSEC) 20 MG capsule Take 20 mg by mouth daily.       No current facility-administered medications for this visit.    No Known Allergies   Review of Systems: All systems reviewed and negative except where noted in HPI.   Lab Results  Component Value Date   WBC 5.1 04/23/2016   HGB 14.3 04/23/2016   HCT 41.6 04/23/2016   MCV 96.5 04/23/2016   PLT 187.0 04/23/2016    Lab Results  Component Value Date   CREATININE 0.93 04/23/2016   BUN 19 04/23/2016   NA 139 04/23/2016   K 4.5 04/23/2016   CL 106 04/23/2016   CO2 27 04/23/2016    Lab Results  Component Value Date   ALT 26 04/23/2016   AST 16 04/23/2016   ALKPHOS 52 04/23/2016   BILITOT 0.8 04/23/2016      Physical Exam: BP 126/70   Pulse 60   Ht 5\' 9"  (1.753 m)   Wt 181 lb (82.1 kg)   BMI 26.73 kg/m  Constitutional: Pleasant,well-developed, male in no acute distress. HEENT: Normocephalic and atraumatic. Conjunctivae are normal. No scleral icterus. Neck supple.  Cardiovascular: Normal rate, regular rhythm.  Pulmonary/chest: Effort normal and breath sounds normal. No wheezing, rales or rhonchi. Abdominal: Soft, nondistended, nontender. There are no masses palpable. No hepatomegaly. Extremities: no edema Lymphadenopathy: No cervical adenopathy noted. Neurological: Alert and oriented to person place and time. Skin: Skin is warm and dry. No rashes noted. Psychiatric: Normal mood and affect. Behavior is normal.   ASSESSMENT AND PLAN: 60 year old male here for evaluation of the following issues:  Chronic diarrhea - negative stool testing for C. difficile and stool culture. Given persistence of symptoms we'll check fecal leukocytes as well as ova parasite, and check celiac serologies. He is taking a fair amount of NSAIDs on a weekly basis, I counseled him on this and recommend he completely avoid NSAIDs at this time to see if this helps. He can use Tylenol as needed. He otherwise can use Imodium as needed to see if this helps form stool. His colonoscopy is up-to-date and is not due for a follow-up exam until 2019 for his surveillance purposes, however if symptoms persist despite intervention and his workup is negative we may consider colonoscopy to rule out microscopic colitis. I will contact him with results of this test.  GERD - no reported history of Barrett's although we will obtain his prior EGD records to confirm this. Discussed long-term management of reflux with him in general and risks of PPIs. We had a lengthy discussion about all risks of PPIs, recommend he use an alternative to control chronic mild symptoms or low-dose dose of PPI needed. Following this discussion he wants to  transition to Zantac to see if this controls his symptoms, if it does help continue with this. If Zantac does not work he can resume Prilosec as needed or low-dose daily.  Harmony Cellar, MD [facility] Pager [phone]  CC: Laurey Morale, MD

## 2016-05-14 ENCOUNTER — Encounter: Payer: Self-pay | Admitting: Gastroenterology

## 2016-05-14 LAB — FECAL LACTOFERRIN, QUANT: Lactoferrin: NEGATIVE

## 2016-05-14 LAB — TISSUE TRANSGLUTAMINASE, IGA: Tissue Transglutaminase Ab, IgA: 1 U/mL (ref <4)

## 2016-05-14 LAB — OVA AND PARASITE EXAMINATION: OP: NONE SEEN

## 2016-05-14 NOTE — Progress Notes (Signed)
Letter mailed

## 2016-05-15 NOTE — Progress Notes (Signed)
Letter mailed

## 2016-05-29 ENCOUNTER — Encounter: Payer: Self-pay | Admitting: Cardiovascular Disease

## 2016-05-29 ENCOUNTER — Ambulatory Visit (INDEPENDENT_AMBULATORY_CARE_PROVIDER_SITE_OTHER): Payer: Managed Care, Other (non HMO) | Admitting: Cardiovascular Disease

## 2016-05-29 VITALS — BP 128/78 | HR 62 | Ht 69.0 in | Wt 180.0 lb

## 2016-05-29 DIAGNOSIS — I491 Atrial premature depolarization: Secondary | ICD-10-CM | POA: Diagnosis not present

## 2016-05-29 DIAGNOSIS — I10 Essential (primary) hypertension: Secondary | ICD-10-CM

## 2016-05-29 DIAGNOSIS — E782 Mixed hyperlipidemia: Secondary | ICD-10-CM

## 2016-05-29 DIAGNOSIS — F1010 Alcohol abuse, uncomplicated: Secondary | ICD-10-CM

## 2016-05-29 DIAGNOSIS — Z7289 Other problems related to lifestyle: Secondary | ICD-10-CM

## 2016-05-29 DIAGNOSIS — I517 Cardiomegaly: Secondary | ICD-10-CM | POA: Diagnosis not present

## 2016-05-29 DIAGNOSIS — F1090 Alcohol use, unspecified, uncomplicated: Secondary | ICD-10-CM

## 2016-05-29 NOTE — Progress Notes (Signed)
Cardiology Consultation Note    Date:  05/29/2016   ID:  Michael Lambert, DOB 1955/08/30, MRN [id number]  PCP:  Alysia Penna, MD  Cardiologist:   Sanda Klein, MD   Chief Complaint  Patient presents with  . Follow-up    History of Present Illness:  Michael Lambert is a 61 y.o. male gout known cardiac illness, presenting with complaints of palpitations.   Using a smart phone app he has documented very good quality tracings that identify his palpitations as secondary to isolated premature atrial contractions. No atrial fibrillation is seen. After he started taking beta blockers he had almost complete resolution of his palpitations. Only one brief event has occurred in the last 2 months.  Michael Lambert is very physically active and fit. He swims or rides a mountain bike 6 days a week, between 30 minutes and 2 hours at a time. He is minimally overweight. During physical activity he never experienced the palpitations and has not had problems with dyspnea, angina, syncope, focal neurological complaints, claudication or leg edema.  He reports not having hypertension, but his blood pressure has often been "borderline". He has high cholesterol and takes a statin. He has gastroesophageal reflux disease that is well controlled with omeprazole.  He does not have a family history of cardiac disease. His brother also has high cholesterol but has not had coronary other vascular problems by the age of 19. Unfortunately both Michael Lambert's parents died relatively young from ovarian cancer and multiple sclerosis respectively.  Michael Lambert drinks alcohol every day on average of 20 drinks a week. He also drinks at least 45 caffeinated beverages (coffee or diet soda) daily.  His electrocardiogram shows normal sinus rhythm without any ectopic beats but it does have some subtle abnormalities. There are borderline voltage criteria for LVH and subtle ST segment and T-wave changes that suggest secondary repolarization  abnormalities.    Past Medical History:  Diagnosis Date  . Anxiety    sees Dr. Pearson Grippe   . Benign prostatic hypertrophy   . GERD (gastroesophageal reflux disease)   . Hyperlipidemia   . Migraine     Past Surgical History:  Procedure Laterality Date  . APPENDECTOMY    . colonoscopy  10-08-12   per Dr. Deatra Ina, adenomatous polyps, repeat in 5 yrs   . CYSTOSCOPY     per Dr. Rosana Hoes  . HERNIA REPAIR  2008   bilateral inguinal, per Dr. Zella Richer  . PALATE / UVULA BIOPSY / EXCISION  2004   per Dr. Lucia Gaskins, for snoring   . RADIAL KERATOTOMY     sees Dr. Mercer Pod at [facility] , bilateral  . repair detached retina    . TONSILLECTOMY      Current Medications: Outpatient Medications Prior to Visit  Medication Sig Dispense Refill  . atorvastatin (LIPITOR) 40 MG tablet TAKE 1 TABLET BY MOUTH ONCE DAILY 30 tablet 2  . metoprolol succinate (TOPROL-XL) 25 MG 24 hr tablet Take 0.5 tablets (12.5 mg total) by mouth daily. 45 tablet 3  . dicyclomine (BENTYL) 20 MG tablet Take 1 tablet (20 mg total) by mouth 3 (three) times daily. 90 tablet 2  . omeprazole (PRILOSEC) 20 MG capsule Take 20 mg by mouth daily.       No facility-administered medications prior to visit.      Allergies:   Patient has no known allergies.   Social History   Social History  . Marital status: Married    Spouse name: N/A  . Number of  children: N/A  . Years of education: N/A   Social History Main Topics  . Smoking status: Former Smoker    Quit date: 05/19/1980  . Smokeless tobacco: Never Used  . Alcohol use 0.0 oz/week     Comment: 4 glasses of wine each day  . Drug use: No  . Sexual activity: Not Asked   Other Topics Concern  . None   Social History Narrative  . None     Family History:  The patient's family history includes Hyperlipidemia in his father.   ROS:   Please see the history of present illness.    ROS All other systems reviewed and are negative.   PHYSICAL EXAM:   VS:  BP  128/78   Pulse 62   Ht 5\' 9"  (1.753 m)   Wt 180 lb (81.6 kg)   BMI 26.58 kg/m    GEN: Well nourished, well developed, in no acute distress  HEENT: normal  Neck: no JVD, carotid bruits, or masses Cardiac: RRR; no murmurs, rubs, or edema . He has a very distinct fourth heart sound. Respiratory:  clear to auscultation bilaterally, normal work of breathing GI: soft, nontender, nondistended, + BS MS: no deformity or atrophy  Skin: warm and dry, no rash Neuro:  Alert and Oriented x 3, Strength and sensation are intact Psych: euthymic mood, full affect  Wt Readings from Last 3 Encounters:  05/29/16 180 lb (81.6 kg)  05/13/16 181 lb (82.1 kg)  04/23/16 179 lb (81.2 kg)      Studies/Labs Reviewed:   EKG:  EKG is ordered today.  The ekg ordered today demonstrates Normal sinus rhythm, borderline voltage criteria for LVH, slight ST segment depression and mild T-wave inversion mostly seen in leads V5-V6 and in the inferior leads  Recent Labs: 01/07/2016: Magnesium 2.0 04/23/2016: ALT 26; BUN 19; Creatinine, Ser 0.93; Hemoglobin 14.3; Platelets 187.0; Potassium 4.5; Sodium 139; TSH 2.29   Lipid Panel    Component Value Date/Time   CHOL 187 12/14/2015 1205   TRIG 161.0 (H) 12/14/2015 1205   HDL 49.50 12/14/2015 1205   CHOLHDL 4 12/14/2015 1205   VLDL 32.2 12/14/2015 1205   LDLCALC 106 (H) 12/14/2015 1205   LDLDIRECT 143.7 08/20/2012 0955     ASSESSMENT:    1. Premature atrial contractions   2. Essential hypertension   3. Left atrial dilatation   4. Habitual alcohol use   5. Hyperlipidemia, mixed      PLAN:  In order of problems listed above:  1. PACs: He previously described at least one episode of sustained palpitations, possible atrial fibrillation with rapid ventricular response? that lasted for a couple of hours. If he has sustained palpitations, he should try to document them with the Alive[employer]/Kardia app. Even if we identify atrial fibrillation, Michael Lambert is in a relatively  low embolic risk group and I don't think we would be recommending full anticoagulation. From a symptom point of view the metoprolol has had an excellent palliative effect on his palpitations. 2. HTN: Michael Lambert had borderline elevation in both his systolic and diastolic blood pressure, but after starting a beta blocker his blood pressure is great. He does not have any side effects from the metoprolol. 3. LA dilation on echo: This may be related to hypertension, although there was no evidence of systolic or diastolic left ventricular dysfunction on echo. He might have an atrial myopathy. 4. Alcohol use: While I don't think he needs to practice complete abstinence from alcohol, he probably needs to  cut his alcohol intake in half to avoid health consequences. 5. HLP: Parameters are generally close to desirable range with very slight residual hypertriglyceridemia    Medication Adjustments/Labs and Tests Ordered: Current medicines are reviewed at length with the patient today.  Concerns regarding medicines are outlined above.  Medication changes, Labs and Tests ordered today are listed in the Patient Instructions below. Patient Instructions  Dr Sallyanne Kuster recommends that you schedule a follow-up appointment in 1 year. You will receive a reminder letter in the mail two months in advance. If you don't receive a letter, please call our office to schedule the follow-up appointment.  If you need a refill on your cardiac medications before your next appointment, please call your pharmacy.    Signed, Sanda Klein, MD  05/29/2016 8:42 AM    [facility] [address], [place], [place]  [address] Phone: [phone]; Fax: [phone]

## 2016-05-29 NOTE — Patient Instructions (Signed)
Dr Croitoru recommends that you schedule a follow-up appointment in 1 year. You will receive a reminder letter in the mail two months in advance. If you don't receive a letter, please call our office to schedule the follow-up appointment.  If you need a refill on your cardiac medications before your next appointment, please call your pharmacy. 

## 2016-06-10 ENCOUNTER — Telehealth: Payer: Self-pay | Admitting: Gastroenterology

## 2016-06-10 NOTE — Telephone Encounter (Signed)
Received prior records from [facility] regarding prior workup for this patient:  EGD in 09/09/2002 - 3cm hiatal hernia, short tongue of Barrett's suspected but biopsies NEGATIVE for GIM / Barrett's  No need for repeat EGD at this time if reflux symptoms well controlled, no dysphagia

## 2016-06-17 ENCOUNTER — Telehealth: Payer: Self-pay

## 2016-06-17 NOTE — Telephone Encounter (Signed)
Okay sounds good. thanks ?

## 2016-06-17 NOTE — Telephone Encounter (Signed)
-----   Message from Manus Gunning, MD sent at 06/17/2016  1:19 PM EST ----- Irven Shelling, I saw this patient recently and got prior EGD results showing he did not have barett's on biopsies. His primary care saw him recently and I think the patient wants an EGD for Barrett's screening. I can offer him an EGD for this purpose if he is anxious about it. Can you check with him? Thanks

## 2016-06-17 NOTE — Telephone Encounter (Signed)
Spoke to patient and he has been on GERD medication for a long time, would like to discuss coming off of medication and EGD. He wants to talk about EGD first before scheduling. Scheduled for OV on 07/17/16.

## 2016-07-17 ENCOUNTER — Encounter: Payer: Self-pay | Admitting: Gastroenterology

## 2016-07-17 ENCOUNTER — Ambulatory Visit (INDEPENDENT_AMBULATORY_CARE_PROVIDER_SITE_OTHER): Payer: Managed Care, Other (non HMO) | Admitting: Gastroenterology

## 2016-07-17 VITALS — BP 122/70 | HR 68 | Ht 69.0 in | Wt 182.5 lb

## 2016-07-17 DIAGNOSIS — K219 Gastro-esophageal reflux disease without esophagitis: Secondary | ICD-10-CM | POA: Diagnosis not present

## 2016-07-17 DIAGNOSIS — R194 Change in bowel habit: Secondary | ICD-10-CM

## 2016-07-17 NOTE — Progress Notes (Signed)
HPI :  61 year old male here for a follow-up visit to discuss reflux in his medication regimen. I previously saw him in December at which point we discussed the risks and benefits of long-term PPI use. He has a history of long-standing reflux with a remote EGD showing hiatal hernia without Barrett's esophagus. He tried tapering off PPI and transitioned to Zantac which did not work very well at all. He did frequent reflux symptoms and needing to use Tums frequently. He has resumed Prilosec 20 mg per day and overall doing really well with only mild breakthrough. He denies dysphagia.  Otherwise at the last visit he reported ongoing loose stools in the setting of NSAID use. He had a negative stool test for ova and parasites as well as negative lactoferrin and testing for celiac disease. He has stopped all NSAIDs in his stools have for the most part normalized and no longer bothering him.  EGD done 09/09/2002 - 3cm hiatal hernia, short tongue of suspected Barrett's but biopsies negative Colonoscopy 10/08/2012 - small cecal adenoma, otherwise normal    Past Medical History:  Diagnosis Date  . Anxiety    sees Dr. Pearson Grippe   . Benign prostatic hypertrophy   . GERD (gastroesophageal reflux disease)   . Hyperlipidemia   . Migraine      Past Surgical History:  Procedure Laterality Date  . APPENDECTOMY    . colonoscopy  10-08-12   per Dr. Deatra Ina, adenomatous polyps, repeat in 5 yrs   . CYSTOSCOPY     per Dr. Rosana Hoes  . HERNIA REPAIR  2008   bilateral inguinal, per Dr. Zella Richer  . PALATE / UVULA BIOPSY / EXCISION  2004   per Dr. Lucia Gaskins, for snoring   . RADIAL KERATOTOMY     sees Dr. Mercer Pod at [facility] , bilateral  . repair detached retina    . TONSILLECTOMY     Family History  Problem Relation Age of Onset  . Multiple sclerosis      family hx  . Hyperlipidemia Father   . Colon cancer Neg Hx   . Esophageal cancer Neg Hx   . Rectal cancer Neg Hx   . Stomach cancer Neg Hx      Social History  Substance Use Topics  . Smoking status: Former Smoker    Quit date: 05/19/1980  . Smokeless tobacco: Never Used  . Alcohol use 0.0 oz/week     Comment: 4 glasses of wine each day   Current Outpatient Prescriptions  Medication Sig Dispense Refill  . atorvastatin (LIPITOR) 40 MG tablet TAKE 1 TABLET BY MOUTH ONCE DAILY 30 tablet 2  . metoprolol succinate (TOPROL-XL) 25 MG 24 hr tablet Take 0.5 tablets (12.5 mg total) by mouth daily. 45 tablet 3  . omeprazole (PRILOSEC) 20 MG capsule Take 20 mg by mouth daily.     No current facility-administered medications for this visit.    No Known Allergies   Review of Systems: All systems reviewed and negative except where noted in HPI.   Lab Results  Component Value Date   WBC 5.1 04/23/2016   HGB 14.3 04/23/2016   HCT 41.6 04/23/2016   MCV 96.5 04/23/2016   PLT 187.0 04/23/2016    Lab Results  Component Value Date   CREATININE 0.93 04/23/2016   BUN 19 04/23/2016   NA 139 04/23/2016   K 4.5 04/23/2016   CL 106 04/23/2016   CO2 27 04/23/2016     Physical Exam: BP 122/70  Pulse 68   Ht 5\' 9"  (1.753 m)   Wt 182 lb 8 oz (82.8 kg)   BMI 26.95 kg/m  Constitutional: Pleasant,well-developed, male in no acute distress. HEENT: Normocephalic and atraumatic. Conjunctivae are normal. No scleral icterus. Neck supple.  Cardiovascular: Normal rate, regular rhythm.  Pulmonary/chest: Effort normal and breath sounds normal. No wheezing, rales or rhonchi. Abdominal: Soft, nondistended, nontender. There are no masses palpable. No hepatomegaly. Extremities: no edema Lymphadenopathy: No cervical adenopathy noted. Neurological: Alert and oriented to person place and time. Skin: Skin is warm and dry. No rashes noted. Psychiatric: Normal mood and affect. Behavior is normal.   ASSESSMENT AND PLAN: 61 year old male here to discuss the following issues:  GERD / chronic PPI use - we discussed long-term risks and benefits  associated with PPIs, and discussed other treatment modalities for reflux to include surgery, endoscopic Nissen fundoplication, Linx procedure, etc. He prefers to avoid PPIs possible although he is not certain he wants surgical or other invasive types of therapy either. He is clearly failed a trial to come off of PPIs and had significant symptoms when he stopped this. Fortunately low-dose of omeprazole is now working again. Following discussion of options I offered him an upper endoscopy to reassess size of hiatal hernia and ensure no evidence of Barrett's esophagus. If he did have Barrett's we would recommend long-term PPI regardless. If he is considering surgical therapy he will need to have the EGD done. Following discussion of endoscopy he wanted to proceed. Further recommendations pending this result. He will continue low dose omeprazole in the interim.  Change in bowel habits - resolved following cessation of chronic NSAIDs which was likely related. F/u if symptoms recur.   Trinity Cellar, MD [facility] Pager [phone]

## 2016-07-17 NOTE — Patient Instructions (Signed)
If you are age 63 or older, your body mass index should be between 23-30. Your Body mass index is 26.95 kg/m. If this is out of the aforementioned range listed, please consider follow up with your Primary Care Provider.  If you are age 53 or younger, your body mass index should be between 19-25. Your Body mass index is 26.95 kg/m. If this is out of the aformentioned range listed, please consider follow up with your Primary Care Provider.   You have been scheduled for an endoscopy. Please follow written instructions given to you at your visit today. If you use inhalers (even only as needed), please bring them with you on the day of your procedure. Your physician has requested that you go to www.startemmi.com and enter the access code given to you at your visit today. This web site gives a general overview about your procedure. However, you should still follow specific instructions given to you by our office regarding your preparation for the procedure.  Thank you.

## 2016-08-05 ENCOUNTER — Encounter: Payer: Self-pay | Admitting: Gastroenterology

## 2016-08-13 ENCOUNTER — Encounter: Payer: Managed Care, Other (non HMO) | Admitting: Gastroenterology

## 2016-10-13 ENCOUNTER — Emergency Department (HOSPITAL_COMMUNITY): Payer: Managed Care, Other (non HMO)

## 2016-10-13 ENCOUNTER — Emergency Department (HOSPITAL_COMMUNITY)
Admission: EM | Admit: 2016-10-13 | Discharge: 2016-10-13 | Disposition: A | Discharge status: Home / Self Care | Payer: Managed Care, Other (non HMO) | Attending: Emergency Medicine | Admitting: Emergency Medicine

## 2016-10-13 ENCOUNTER — Encounter (HOSPITAL_COMMUNITY): Payer: Self-pay | Admitting: Emergency Medicine

## 2016-10-13 DIAGNOSIS — Z87891 Personal history of nicotine dependence: Secondary | ICD-10-CM | POA: Diagnosis not present

## 2016-10-13 DIAGNOSIS — G459 Transient cerebral ischemic attack, unspecified: Secondary | ICD-10-CM | POA: Insufficient documentation

## 2016-10-13 DIAGNOSIS — G458 Other transient cerebral ischemic attacks and related syndromes: Secondary | ICD-10-CM

## 2016-10-13 DIAGNOSIS — R4701 Aphasia: Secondary | ICD-10-CM | POA: Diagnosis present

## 2016-10-13 LAB — I-STAT CHEM 8, ED
BUN: 16 mg/dL (ref 6–20)
CALCIUM ION: 1.1 mmol/L — AB (ref 1.15–1.40)
Chloride: 103 mmol/L (ref 101–111)
Creatinine, Ser: 1 mg/dL (ref 0.61–1.24)
Glucose, Bld: 106 mg/dL — ABNORMAL HIGH (ref 65–99)
HCT: 43 % (ref 39.0–52.0)
Hemoglobin: 14.6 g/dL (ref 13.0–17.0)
Potassium: 3.6 mmol/L (ref 3.5–5.1)
SODIUM: 137 mmol/L (ref 135–145)
TCO2: 24 mmol/L (ref 0–100)

## 2016-10-13 LAB — CBG MONITORING, ED: GLUCOSE-CAPILLARY: 96 mg/dL (ref 65–99)

## 2016-10-13 LAB — COMPREHENSIVE METABOLIC PANEL
ALT: 34 U/L (ref 17–63)
ANION GAP: 10 (ref 5–15)
AST: 22 U/L (ref 15–41)
Albumin: 4.9 g/dL (ref 3.5–5.0)
Alkaline Phosphatase: 57 U/L (ref 38–126)
BUN: 15 mg/dL (ref 6–20)
CHLORIDE: 106 mmol/L (ref 101–111)
CO2: 24 mmol/L (ref 22–32)
Calcium: 9.7 mg/dL (ref 8.9–10.3)
Creatinine, Ser: 1.02 mg/dL (ref 0.61–1.24)
GFR calc non Af Amer: >60 mL/min (ref >60)
Glucose, Bld: 110 mg/dL — ABNORMAL HIGH (ref 65–99)
Potassium: 3.6 mmol/L (ref 3.5–5.1)
Sodium: 140 mmol/L (ref 135–145)
Total Bilirubin: 0.7 mg/dL (ref 0.3–1.2)
Total Protein: 7.5 g/dL (ref 6.5–8.1)

## 2016-10-13 LAB — DIFFERENTIAL
BASOS PCT: 1 %
Basophils Absolute: 0 10*3/uL (ref 0.0–0.1)
EOS PCT: 4 %
Eosinophils Absolute: 0.3 10*3/uL (ref 0.0–0.7)
Lymphocytes Relative: 36 %
Lymphs Abs: 2.6 10*3/uL (ref 0.7–4.0)
Monocytes Absolute: 0.4 10*3/uL (ref 0.1–1.0)
Monocytes Relative: 6 %
Neutro Abs: 3.9 10*3/uL (ref 1.7–7.7)
Neutrophils Relative %: 53 %

## 2016-10-13 LAB — CBC
HCT: 43.5 % (ref 39.0–52.0)
Hemoglobin: 15 g/dL (ref 13.0–17.0)
MCH: 33.3 pg (ref 26.0–34.0)
MCHC: 34.5 g/dL (ref 30.0–36.0)
MCV: 96.7 fL (ref 78.0–100.0)
Platelets: 185 10*3/uL (ref 150–400)
RBC: 4.5 MIL/uL (ref 4.22–5.81)
RDW: 12.3 % (ref 11.5–15.5)
WBC: 7.1 10*3/uL (ref 4.0–10.5)

## 2016-10-13 LAB — I-STAT TROPONIN, ED: Troponin i, poc: 0 ng/mL (ref 0.00–0.08)

## 2016-10-13 LAB — APTT: aPTT: 34 seconds (ref 24–36)

## 2016-10-13 LAB — PROTIME-INR
INR: 1
Prothrombin Time: 13.2 seconds (ref 11.4–15.2)

## 2016-10-13 MED ORDER — ASPIRIN 325 MG PO TABS
325.0000 mg | ORAL_TABLET | Freq: Once | ORAL | Status: AC
  Administered 2016-10-13: 325 mg via ORAL
  Filled 2016-10-13: qty 1

## 2016-10-13 NOTE — ED Notes (Signed)
Pt transported to MRI 

## 2016-10-13 NOTE — Discharge Instructions (Signed)
Evaluation and testing indicates that you had a transient ischemic attack.  At this point the treatment is aspirin, one each day.  Use a 325 mg coated tablet.  You will need follow-up evaluations with carotid Doppler and echocardiogram.  These will be done at [facility] vascular imaging, and cardiovascular laboratories. We have requested that these be done as soon as possible.  If you do not hear from these departments in the next couple of days, you can call them through the [facility] operator, [phone].  If you develop symptoms again, go to the [facility] emergency department.

## 2016-10-13 NOTE — ED Notes (Signed)
Pt arrived back from CT

## 2016-10-13 NOTE — ED Notes (Signed)
Pt ambulated to restroom and back to stretcher with a steady gait and [employer], Therapist, sports.

## 2016-10-13 NOTE — ED Triage Notes (Signed)
Pt comes in with complaints of blurry vision and slurred speech that started around 1630.  Pt reports blurry vision has ceased at this time but trouble speaking continues.  Hypertensive during triage but denies taking blood pressure medication. Made Dr. Eulis Foster aware.

## 2016-10-13 NOTE — ED Notes (Signed)
Pt. In MRI.

## 2016-10-13 NOTE — ED Notes (Signed)
ED Provider at bedside. 

## 2016-10-13 NOTE — ED Provider Notes (Signed)
[facility] DEPT Provider Note   CSN: [id number] Arrival date & time: 10/13/16  1712     History   Chief Complaint Chief Complaint  Patient presents with  . Aphasia  . Hypertension  . Blurred Vision    HPI   Michael Lambert is a 61 y.o. male.  He is here for evaluation of a transient period of blurred vision, followed by "jumbled up words,", which started around 16: 30 today.  Blurred vision in the trouble talking have both improved and almost completely resolved.  No prior similar problem.  He presents here by private vehicle, with his wife.  He describes the visual problem as "fuzzy head", with some blurriness in the right extreme peripheral visual field.  No left eye symptoms.  At that point he walked into the living room where his wife was, and she asked him what was wrong because he appeared to be in distress.  He said 2 words that made no sense.  She immediately went to get her daughter, then came back to the room and he was able to speak.  He had some word finding trouble, which persisted until he arrived in triage in the emergency department.  When I examined him he did not have word finding problems.  He denies headache, weakness, nausea, vomiting, neck or back pain.  There are no other known modifying factors.   HPI  Past Medical History:  Diagnosis Date  . Anxiety    sees Dr. Pearson Grippe   . Benign prostatic hypertrophy   . GERD (gastroesophageal reflux disease)   . Hyperlipidemia   . Migraine     Patient Active Problem List   Diagnosis Date Noted  . Premature atrial contractions 05/29/2016  . Left atrial dilatation 05/29/2016  . Diarrhea 04/23/2016  . Palpitations 01/28/2016  . Anxiety 10/22/2011  . URI (upper respiratory infection) 08/05/2010  . HEMATOMA 02/26/2010  . Hyperlipidemia 01/11/2007  . GERD 01/11/2007  . BENIGN PROSTATIC HYPERTROPHY 01/11/2007  . HEADACHE 01/11/2007    Past Surgical History:  Procedure Laterality Date  . APPENDECTOMY    .  colonoscopy  10-08-12   per Dr. Deatra Ina, adenomatous polyps, repeat in 5 yrs   . CYSTOSCOPY     per Dr. Rosana Hoes  . HERNIA REPAIR  2008   bilateral inguinal, per Dr. Zella Richer  . PALATE / UVULA BIOPSY / EXCISION  2004   per Dr. Lucia Gaskins, for snoring   . RADIAL KERATOTOMY     sees Dr. Mercer Pod at [facility] , bilateral  . repair detached retina    . TONSILLECTOMY         Home Medications    Prior to Admission medications   Medication Sig Start Date End Date Taking? Authorizing Provider  atorvastatin (LIPITOR) 40 MG tablet TAKE 1 TABLET BY MOUTH ONCE DAILY 04/15/16  Yes Laurey Morale, MD  metoprolol succinate (TOPROL-XL) 25 MG 24 hr tablet Take 0.5 tablets (12.5 mg total) by mouth daily. Patient taking differently: Take 12.5 mg by mouth 2 (two) times daily.  02/27/16  Yes Croitoru, Mihai, MD  omeprazole (PRILOSEC) 20 MG capsule Take 20 mg by mouth daily.   Yes [provider]    Family History Family History  Problem Relation Age of Onset  . Multiple sclerosis Unknown        family hx  . Hyperlipidemia Father   . Colon cancer Neg Hx   . Esophageal cancer Neg Hx   . Rectal cancer Neg Hx   .  Stomach cancer Neg Hx     Social History Social History  Substance Use Topics  . Smoking status: Former Smoker    Quit date: 05/19/1980  . Smokeless tobacco: Never Used  . Alcohol use 0.0 oz/week     Comment: 4 glasses of wine each day     Allergies   Patient has no known allergies.   Review of Systems Review of Systems  All other systems reviewed and are negative.    Physical Exam Updated Vital Signs BP 132/77 (BP Location: Left Arm)   Pulse 65   Temp 97.9 F (36.6 C) (Oral)   Resp 18   SpO2 97%   Physical Exam  Constitutional: He is oriented to person, place, and time. He appears well-developed and well-nourished. No distress.  HENT:  Head: Normocephalic and atraumatic.  Right Ear: External ear normal.  Left Ear: External ear normal.  Eyes:  Conjunctivae and EOM are normal. Pupils are equal, round, and reactive to light.  Neck: Normal range of motion and phonation normal. Neck supple.  Cardiovascular: Normal rate, regular rhythm and normal heart sounds.   Pulmonary/Chest: Effort normal and breath sounds normal. He exhibits no bony tenderness.  Abdominal: Soft. There is no tenderness.  Musculoskeletal: Normal range of motion.  Neurological: He is alert and oriented to person, place, and time. No cranial nerve deficit or sensory deficit. He exhibits normal muscle tone. Coordination normal.  No dysarthria, aphasia, or nystagmus.  No pronator drift.  No ataxia.  Normal finger to nose and heel to shin bilaterally.  Skin: Skin is warm, dry and intact.  Psychiatric: He has a normal mood and affect. His behavior is normal. Judgment and thought content normal.  Nursing note and vitals reviewed.    ED Treatments / Results  Labs (all labs ordered are listed, but only abnormal results are displayed) Labs Reviewed  COMPREHENSIVE METABOLIC PANEL - Abnormal; Notable for the following:       Result Value   Glucose, Bld 110 (*)    All other components within normal limits  I-STAT CHEM 8, ED - Abnormal; Notable for the following:    Glucose, Bld 106 (*)    Calcium, Ion 1.10 (*)    All other components within normal limits  PROTIME-INR  APTT  CBC  DIFFERENTIAL  I-STAT TROPOININ, ED  CBG MONITORING, ED    EKG  EKG Interpretation  Date/Time:  Monday Oct 13 2016 17:21:15 EDT Ventricular Rate:  69 PR Interval:    QRS Duration: 92 QT Interval:  430 QTC Calculation: 461 R Axis:   51 Text Interpretation:  Sinus rhythm since last tracing no significant change Confirmed by Daleen Bo [phone]) on 10/14/2016 1:34:35 PM           Radiology Mr Brain Wo Contrast  Result Date: 10/13/2016 CLINICAL DATA:  Blurred vision and slurred speech.  Claustrophobia. EXAM: MRI HEAD WITHOUT CONTRAST TECHNIQUE: Multiplanar, multiecho pulse  sequences of the brain and surrounding structures were obtained without intravenous contrast. COMPARISON:  CT head earlier today. FINDINGS: Brain: No evidence for acute infarction, hemorrhage, mass lesion, hydrocephalus, or extra-axial fluid. Normal for age cerebral volume. Mild subcortical and periventricular T2 and FLAIR hyperintensities, likely chronic microvascular ischemic change. Incompletely assessed on this non tailored examination for the pituitary is a cystic lesion within the sella, resulting in significant thinning of the gland, measuring 9 x 7 x 10 mm (R-L x A-P x C-C). 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 A Rathke's  cleft cyst or cystic pituitary adenoma could also have this appearance. There is some suprasellar extension, but the optic chiasm is not compressed or displaced. Vascular: Flow voids are maintained throughout the carotid, basilar, and vertebral arteries. There are no areas of chronic hemorrhage. Skull and upper cervical spine: Normal marrow signal. Sinuses/Orbits: Negative. Other: None. IMPRESSION: No acute stroke or intracranial mass lesion. Mild subcortical and periventricular T2 and FLAIR hyperintensities, likely chronic microvascular ischemic change. Incidental cystic lesion of the pituitary, see discussion above. This should be worked up on an elective basis with MRI of the brain without and with contrast according to pituitary protocol, with pre and postcontrast thin-section imaging, including dynamic phase coronal views. Electronically Signed   By: Staci Righter M.D.   On: 10/13/2016 19:57   Ct Head Code Stroke W/o Cm  Result Date: 10/13/2016 CLINICAL DATA:  Code stroke. Visual disturbance peripherally since 1630 hours. Aphasia. Dazed. EXAM: CT HEAD WITHOUT CONTRAST TECHNIQUE: Contiguous axial images were obtained from the base of the skull through the vertex without intravenous contrast. COMPARISON:  05/12/2011. FINDINGS: Brain: No evidence for acute  infarction, hemorrhage, mass lesion, hydrocephalus, or extra-axial fluid. Normal for age cerebral volume. Scattered foci of hypoattenuation of the white matter, likely small vessel disease. Vascular: No hyperdense vessel or unexpected calcification. Skull: Normal. Negative for fracture or focal lesion. Sinuses/Orbits: No acute finding. LEFT ocular surgery. Dense lenticular opacities bilaterally. Other: None. ASPECTS [facility] Stroke Program Early CT Score) - Ganglionic level infarction (caudate, lentiform nuclei, internal capsule, insula, M1-M3 cortex): 7 - Supraganglionic infarction (M4-M6 cortex): 3 Total score (0-10 with 10 being normal): 10 IMPRESSION: 1. Mild small vessel disease.  No acute intracranial findings. 2. ASPECTS is 10 These results were called by telephone at the time of interpretation on 10/13/2016 at 5:54 pm to Dr. Daleen Bo , who verbally acknowledged these results. Electronically Signed   By: Staci Righter M.D.   On: 10/13/2016 17:55    Procedures Procedures (including critical care time)  Medications Ordered in ED Medications  aspirin tablet 325 mg (325 mg Oral Given 10/13/16 2152)     Initial Impression / Assessment and Plan / ED Course  I have reviewed the triage vital signs and the nursing notes.  Pertinent labs & imaging results that were available during my care of the patient were reviewed by me and considered in my medical decision making (see chart for details).  Clinical Course as of Oct 14 1409  Mon Oct 13, 2016  1804 Head CT results discussed with the neuroradiologist, Dr. Jola Baptist, there is no evident stroke.  [EW]    Clinical Course User Index [EW] Daleen Bo, MD     Patient Vitals for the past 24 hrs:  BP Temp Temp src Pulse Resp SpO2  10/13/16 2149 132/77 97.9 F (36.6 C) Oral 65 18 97 %  10/13/16 1900 (!) 142/68 - - 64 14 99 %  10/13/16 1835 (!) 154/80 - - 60 10 99 %  10/13/16 1830 (!) 154/80 - - (!) 58 19 99 %  10/13/16 1807 - 97.7 F (36.5  C) - - - -  10/13/16 1800 (!) 158/84 - - (!) 54 16 99 %  10/13/16 1730 (!) 178/98 - - 68 17 100 %  10/13/16 1722 (!) 188/100 - - 69 19 99 %    8:30 PM Reevaluation with update and discussion. After initial assessment and treatment, an updated evaluation reveals PE unchanged. Daleen Bo L   21: 35-he remains alert, comfortable  and symptom-free.  At this time, he recalls 3 or 4 episodes of word finding difficulty last week.  These were brief episodes.  21: 40-case discussed with stroke neurologist, Dr. Leonie Man; who agrees with the patient's requested treatment plan of outpatient follow-up, and outpatient imaging to evaluate for carotid and cardiac sources.  In the meantime patient is to be maintained on one aspirin each day.  21: 45-discussed treatment plan with patient and wife.  They are in agreement, and will follow up acted, with neurology, for advanced imaging with echocardiogram, and Doppler ultrasound imaging, and see PCP for assistance, with completion of the evaluation for TIA.  Final Clinical Impressions(s) / ED Diagnoses   Final diagnoses:  Other specified transient cerebral ischemias   Evaluation consistent with TIA, brief, associated with difficulty with speech, and a vague mild loss of vision in the lateral peripheral field of the right eye.  Patient comfortable, during evaluation.  No evidence for atrial fibrillation, metabolic instability, serious bacterial infection or suggestion for impending vascular collapse.  Initial mild hypertension improved without treatment, to normal, 132/77.  Nursing Notes Reviewed/ Care Coordinated Applicable Imaging Reviewed Interpretation of Laboratory Data incorporated into ED treatment  The patient appears reasonably screened and/or stabilized for discharge and I doubt any other medical condition or other [facility] requiring further screening, evaluation, or treatment in the ED at this time prior to discharge.  Plan: Home Medications-take 1  coated aspirin each day.  Continue usual medications; Home Treatments-rest, no sporting activity, until follow-up with neurology; return here if the recommended treatment, does not improve the symptoms; Recommended follow up-outpatient echocardiogram and Doppler imaging.  Neurology follow-up within 2 weeks for evaluation of TIA.  PCP for assistance with planning for needed follow-up.    New Prescriptions Discharge Medication List as of 10/13/2016  9:57 PM       Daleen Bo, MD 10/14/16 1416

## 2016-10-14 ENCOUNTER — Other Ambulatory Visit (HOSPITAL_COMMUNITY): Payer: Self-pay | Admitting: Geriatric Medicine

## 2016-10-14 DIAGNOSIS — G459 Transient cerebral ischemic attack, unspecified: Secondary | ICD-10-CM

## 2016-10-15 ENCOUNTER — Ambulatory Visit (HOSPITAL_COMMUNITY)
Admission: RE | Admit: 2016-10-15 | Discharge: 2016-10-15 | Disposition: A | Discharge status: Home / Self Care | Payer: Managed Care, Other (non HMO) | Source: Ambulatory Visit | Attending: Internal Medicine | Admitting: Internal Medicine

## 2016-10-15 DIAGNOSIS — G451 Carotid artery syndrome (hemispheric): Secondary | ICD-10-CM | POA: Diagnosis present

## 2016-10-15 DIAGNOSIS — I6523 Occlusion and stenosis of bilateral carotid arteries: Secondary | ICD-10-CM | POA: Insufficient documentation

## 2016-10-15 DIAGNOSIS — G459 Transient cerebral ischemic attack, unspecified: Secondary | ICD-10-CM | POA: Diagnosis not present

## 2016-10-15 NOTE — Progress Notes (Signed)
**  Preliminary report by tech**  Carotid artery duplex complete. Findings are consistent with a 1-39 percent stenosis involving the right internal carotid artery and the left internal carotid artery. The vertebral arteries demonstrate antegrade flow.  10/15/16 9:32 AM Carlos Levering RVT

## 2016-10-16 ENCOUNTER — Encounter: Payer: Self-pay | Admitting: Cardiovascular Disease

## 2016-10-16 ENCOUNTER — Encounter: Payer: Managed Care, Other (non HMO) | Admitting: Gastroenterology

## 2016-10-16 LAB — VAS US CAROTID
LCCAPSYS: 83 cm/s
LEFT ECA DIAS: -18 cm/s
LEFT VERTEBRAL DIAS: -15 cm/s
LICADDIAS: -22 cm/s
LICADSYS: -58 cm/s
Left CCA dist dias: 26 cm/s
Left CCA dist sys: 79 cm/s
Left CCA prox dias: 21 cm/s
Left ICA prox dias: -25 cm/s
Left ICA prox sys: -63 cm/s
RCCADSYS: -82 cm/s
RIGHT ECA DIAS: -14 cm/s
RIGHT VERTEBRAL DIAS: -3 cm/s

## 2016-10-17 ENCOUNTER — Telehealth: Payer: Self-pay | Admitting: Cardiovascular Disease

## 2016-10-17 NOTE — Telephone Encounter (Signed)
Called Michael Lambert to discuss further evaluation of his recent TIA. He is now completely asymptomatic. 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 He is leaving this weekend on a one-week trip to the beach. He has a neurology appointment scheduled with Dr. Jaynee Eagles on June 19. Discussed the fact that he will need additional cardiac workup to include a minimum of a transthoracic echo with a "bubble study", probably preferably have a transesophageal echo. If these studies are unrevealing and there is no explanation for his TIA based on his neurology workup, he should receive an implantable loop recorder. He has had palpitations occasionally, proven using a commercial Kardia device to be frequent PACs. An implantable loop recorder would be greatly superior in detecting episodes of atrial fibrillation. If atrial fibrillation is detected he would meet criteria for full anticoagulation with a oral agent. For the time being he is taking aspirin 325 mg daily. I told Michael Lambert that I will be out of town myself for the second half of June, but that my partners would be able to take care of necessary evaluation and treatment while I am away, based on the neurologist recommendations.  Sanda Klein, MD, [facility] [phone] office [phone] pager

## 2016-10-23 ENCOUNTER — Ambulatory Visit: Payer: Managed Care, Other (non HMO) | Admitting: Neurology

## 2016-11-04 ENCOUNTER — Encounter: Payer: Self-pay | Admitting: Neurology

## 2016-11-04 ENCOUNTER — Ambulatory Visit (INDEPENDENT_AMBULATORY_CARE_PROVIDER_SITE_OTHER): Payer: Managed Care, Other (non HMO) | Admitting: Neurology

## 2016-11-04 VITALS — BP 136/77 | HR 59 | Ht 69.0 in | Wt 180.0 lb

## 2016-11-04 DIAGNOSIS — R404 Transient alteration of awareness: Secondary | ICD-10-CM

## 2016-11-04 DIAGNOSIS — G43109 Migraine with aura, not intractable, without status migrainosus: Secondary | ICD-10-CM | POA: Diagnosis not present

## 2016-11-04 DIAGNOSIS — R4701 Aphasia: Secondary | ICD-10-CM

## 2016-11-04 DIAGNOSIS — H547 Unspecified visual loss: Secondary | ICD-10-CM | POA: Diagnosis not present

## 2016-11-04 DIAGNOSIS — R410 Disorientation, unspecified: Secondary | ICD-10-CM

## 2016-11-04 DIAGNOSIS — E237 Disorder of pituitary gland, unspecified: Secondary | ICD-10-CM | POA: Diagnosis not present

## 2016-11-04 DIAGNOSIS — E236 Other disorders of pituitary gland: Secondary | ICD-10-CM

## 2016-11-04 NOTE — Patient Instructions (Signed)
Remember to drink plenty of fluid, eat healthy meals and do not skip any meals. Try to eat protein with a every meal and eat a healthy snack such as fruit or nuts in between meals. Try to keep a regular sleep-wake schedule and try to exercise daily, particularly in the form of walking, 20-30 minutes a day, if you can.   As far as your medications are concerned, I would like to suggest: continue Aspirin  As far as diagnostic testing: MRI brain w/ pituitary protocol. Referral to cardiology. Labs  I would like to see you back in 4 months, sooner if we need to. Please call us with any interim questions, concerns, problems, updates or refill requests.   Our phone number is [phone]. We also have an after hours call service for urgent matters and there is a physician on-call for urgent questions. For any emergencies you know to call 911 or go to the nearest emergency room   Transient Ischemic Attack A transient ischemic attack (TIA) is a "warning stroke" that causes stroke-like symptoms. Unlike a stroke, a TIA does not cause permanent damage to the brain. The symptoms of a TIA can happen very fast and do not last long. It is important to know the symptoms of a TIA and what to do. This can help prevent a major stroke or death. What are the causes? A TIA is caused by a temporary blockage in an artery in the brain or neck (carotid artery). The blockage does not allow the brain to get the blood supply it needs and can cause different symptoms. The blockage can be caused by either:  A blood clot.  Fatty buildup (plaque) in a neck or brain artery.  What increases the risk?  High blood pressure (hypertension).  High cholesterol.  Diabetes mellitus.  Heart disease.  The buildup of plaque in the blood vessels (peripheral artery disease or atherosclerosis).  The buildup of plaque in the blood vessels that provide blood and oxygen to the brain (carotid artery stenosis).  An abnormal heart rhythm  (atrial fibrillation).  Obesity.  Using any tobacco products, including cigarettes, chewing tobacco, or electronic cigarettes.  Taking oral contraceptives, especially in combination with using tobacco.  Physical inactivity.  A diet high in fats, salt (sodium), and calories.  Excessive alcohol use.  Use of illegal drugs (especially cocaine and methamphetamine).  Being male.  Being African American.  Being over the age of 74 years.  Family history of stroke.  Previous history of blood clots, stroke, TIA, or heart attack.  Sickle cell disease. What are the signs or symptoms? TIA symptoms are the same as a stroke but are temporary. These symptoms usually develop suddenly, or may be newly present upon waking from sleep:  Sudden weakness or numbness of the face, arm, or leg, especially on one side of the body.  Sudden trouble walking or difficulty moving arms or legs.  Sudden confusion.  Sudden personality changes.  Trouble speaking (aphasia) or understanding.  Difficulty swallowing.  Sudden trouble seeing in one or both eyes.  Double vision.  Dizziness.  Loss of balance or coordination.  Sudden severe headache with no known cause.  Trouble reading or writing.  Loss of bowel or bladder control.  Loss of consciousness.  How is this diagnosed? Your health care provider may be able to determine the presence or absence of a TIA based on your symptoms, history, and physical exam. CT scan of the brain is usually performed to help identify a TIA.  Other tests may include:  Electrocardiography (ECG).  Continuous heart monitoring.  Echocardiography.  Carotid ultrasonography.  MRI.  A scan of the brain circulation.  Blood tests.  How is this treated? Since the symptoms of TIA are the same as a stroke, it is important to seek treatment as soon as possible. You may need a medicine to dissolve a blood clot (thrombolytic) if that is the cause of the TIA. This  medicine cannot be given if too much time has passed. Treatment may also include:  Rest, oxygen, fluids through an IV tube, and medicines to thin the blood (anticoagulants).  Measures will be taken to prevent short-term and long-term complications, including infection from breathing foreign material into the lungs (aspiration pneumonia), blood clots in the legs, and falls.  Procedures to either remove plaque in the carotid arteries or dilate carotid arteries that have narrowed due to plaque. Those procedures are: ? Carotid endarterectomy. ? Carotid angioplasty and stenting.  Medicines and diet may be used to address diabetes, high blood pressure, and other underlying risk factors.  Follow these instructions at home:  Take medicines only as directed by your health care provider. Follow the directions carefully. Medicines may be used to control risk factors for a stroke. Be sure you understand all your medicine instructions.  You may be told to take aspirin or the anticoagulant warfarin. Warfarin needs to be taken exactly as instructed. ? Taking too much or too little warfarin is dangerous. Too much warfarin increases the risk of bleeding. Too little warfarin continues to allow the risk for blood clots. While taking warfarin, you will need to have regular blood tests to measure your blood clotting time. A PT blood test measures how long it takes for blood to clot. Your PT is used to calculate another value called an INR. Your PT and INR help your health care provider to adjust your dose of warfarin. The dose can change for many reasons. It is critically important that you take warfarin exactly as prescribed. ? Many foods, especially foods high in vitamin K can interfere with warfarin and affect the PT and INR. Foods high in vitamin K include spinach, kale, broccoli, cabbage, collard and turnip greens, Brussels sprouts, peas, cauliflower, seaweed, and parsley, as well as beef and pork liver, green  tea, and soybean oil. You should eat a consistent amount of foods high in vitamin K. Avoid major changes in your diet, or notify your health care provider before changing your diet. Arrange a visit with a dietitian to answer your questions. ? Many medicines can interfere with warfarin and affect the PT and INR. You must tell your health care provider about any and all medicines you take; this includes all vitamins and supplements. Be especially cautious with aspirin and anti-inflammatory medicines. Do not take or discontinue any prescribed or over-the-counter medicine except on the advice of your health care provider or pharmacist. ? Warfarin can have side effects, such as excessive bruising or bleeding. You will need to hold pressure over cuts for longer than usual. Your health care provider or pharmacist will discuss other potential side effects. ? Avoid sports or activities that may cause injury or bleeding. ? Be careful when shaving, flossing your teeth, or handling sharp objects. ? Alcohol can change the body's ability to handle warfarin. It is best to avoid alcoholic drinks or consume only very small amounts while taking warfarin. Notify your health care provider if you change your alcohol intake. ? Notify your dentist or other  health care providers before procedures.  Eat a diet that includes 5 or more servings of fruits and vegetables each day. This may reduce the risk of stroke. Certain diets may be prescribed to address high blood pressure, high cholesterol, diabetes, or obesity. ? A diet low in sodium, saturated fat, trans fat, and cholesterol is recommended to manage high blood pressure. ? A diet low in saturated fat, trans fat, and cholesterol, and high in fiber may control cholesterol levels. ? A controlled-carbohydrate, controlled-sugar diet is recommended to manage diabetes. ? A reduced-calorie diet that is low in sodium, saturated fat, trans fat, and cholesterol is recommended to manage  obesity.  Maintain a healthy weight.  Stay physically active. It is recommended that you get at least 30 minutes of activity on most or all days.  Do not use any tobacco products, including cigarettes, chewing tobacco, or electronic cigarettes. If you need help quitting, ask your health care provider.  Limit alcohol intake to no more than 1 drink per day for nonpregnant women and 2 drinks per day for men. One drink equals 12 ounces of beer, 5 ounces of wine, or 1 ounces of hard liquor.  Do not abuse drugs.  A safe home environment is important to reduce the risk of falls. Your health care provider may arrange for specialists to evaluate your home. Having grab bars in the bedroom and bathroom is often important. Your health care provider may arrange for equipment to be used at home, such as raised toilets and a seat for the shower.  Follow all instructions for follow-up with your health care provider. This is very important. This includes any referrals and lab tests. Proper follow-up can prevent a stroke or another TIA from occurring. How is this prevented? The risk of a TIA can be decreased by appropriately treating high blood pressure, high cholesterol, diabetes, heart disease, and obesity, and by quitting smoking, limiting alcohol, and staying physically active. Contact a health care provider if:  You have personality changes.  You have difficulty swallowing.  You are seeing double.  You have dizziness.  You have a fever. Get help right away if: Any of the following symptoms may represent a serious problem that is an emergency. Do not wait to see if the symptoms will go away. Get medical help right away. Call your local emergency services (911 in [place] Do not drive yourself to the hospital.  You have sudden weakness or numbness of the face, arm, or leg, especially on one side of the body.  You have sudden trouble walking or difficulty moving arms or legs.  You have sudden  confusion.  You have trouble speaking (aphasia) or understanding.  You have sudden trouble seeing in one or both eyes.  You have a loss of balance or coordination.  You have a sudden, severe headache with no known cause.  You have new chest pain or an irregular heartbeat.  You have a partial or total loss of consciousness.  This information is not intended to replace advice given to you by your health care provider. Make sure you discuss any questions you have with your health care provider. Document Released: 02/12/2005 Document Revised: 01/07/2016 Document Reviewed: 08/10/2013 Elsevier Interactive Patient Education  2017 [employer].

## 2016-11-04 NOTE — Progress Notes (Signed)
[facility]    Provider:  Dr Jaynee Eagles Referring Provider: Josetta Huddle, MD Primary Care Physician:  Josetta Huddle, MD  CC:  TIA vs Migraine  HPI:  Michael Lambert is a 61 y.o. male here as a referral from Dr. Inda Merlin for TIA. Patient was seen in the emergency room for transient episode of blurred vision.  He is here with his wife. He is active and healthy. On memorial day weekend he felt "fuzzy". Also had some subtle loss of peripheral vision on the right, blurry not vision loss. Wife knew something was "off". He knew what he wanted to say but could not form the words. He spoke non-sensical language, he was taken to [facility] and had some slurred speech there. He couldn't tell them who the president was. He was improving. No code stroke was called per patient. He was seen quickly and went straight to cat scan and that was negative. He was not admitted for a workup. This has never happened to him before but he does have a hx of migraines at the age of 1. He has a nephew who had a TIA. He sees Dr. Orene Desanctis who has recommended TEE and loop. No other focal neurologic deficits, associated symptoms, inciting events or modifiable factors.  Reviewed notes, labs and imaging from outside physicians, which showed:  Patient was seen in the emergency room for transient episode of blurred vision followed by "jumbled up words". This started about 4:30 in 10/13/2016. When she was seen in the emergency room symptoms had resolved. No prior similar problems. He also described a fuzzy head with some blurriness in the right extreme peripheral visual field. No left eye symptoms. He walked into the living room where his wife was and she asked him what was wrong. He said 2 words that made no sense. She merely went to get her daughter and he was able to speak. He also has some word finding trouble. No stroke was seen on CT of the head.  MRI of the brain, personally reviewed images, showed no acute changes but  did show a cystic lesion of the pituitary gland which needs further workup. Ultrasound carotid duplex bilaterally did not show any significant stenosis of the carotid arteries.  TSH 04/2016 was normal. Cholesterol panel in July 2017 showed elevated triglycerides 161 and elevated LDL 106.   Review of Systems: Patient complains of symptoms per HPI as well as the following symptoms: no CP, no SOB. Pertinent negatives and positives per HPI. All others negative.   Social History   Social History  . Marital status: Married    Spouse name: N/A  . Number of children: N/A  . Years of education: N/A   Occupational History  . Not on file.   Social History Main Topics  . Smoking status: Former Smoker    Quit date: 05/19/1980  . Smokeless tobacco: Never Used  . Alcohol use 0.0 oz/week     Comment: 4 glasses of wine each day  . Drug use: No  . Sexual activity: Not on file   Other Topics Concern  . Not on file   Social History Narrative  . No narrative on file    Family History  Problem Relation Age of Onset  . Multiple sclerosis Unknown        family hx  . Hyperlipidemia Father   . Colon cancer Neg Hx   . Esophageal cancer Neg Hx   . Rectal cancer Neg Hx   . Stomach cancer  Neg Hx     Past Medical History:  Diagnosis Date  . Anxiety    sees Dr. Pearson Grippe   . Benign prostatic hypertrophy   . GERD (gastroesophageal reflux disease)   . Hyperlipidemia   . Migraine     Past Surgical History:  Procedure Laterality Date  . APPENDECTOMY    . colonoscopy  10-08-12   per Dr. Deatra Ina, adenomatous polyps, repeat in 5 yrs   . CYSTOSCOPY     per Dr. Rosana Hoes  . HERNIA REPAIR  2008   bilateral inguinal, per Dr. Zella Richer  . PALATE / UVULA BIOPSY / EXCISION  2004   per Dr. Lucia Gaskins, for snoring   . RADIAL KERATOTOMY     sees Dr. Mercer Pod at [facility] , bilateral  . repair detached retina    . TONSILLECTOMY      Current Outpatient Prescriptions  Medication Sig Dispense  Refill  . aspirin 325 MG tablet Take 325 mg by mouth daily.    Marland Kitchen atorvastatin (LIPITOR) 40 MG tablet TAKE 1 TABLET BY MOUTH ONCE DAILY 30 tablet 2  . metoprolol succinate (TOPROL-XL) 25 MG 24 hr tablet Take 0.5 tablets (12.5 mg total) by mouth daily. (Patient taking differently: Take 12.5 mg by mouth 2 (two) times daily. ) 45 tablet 3  . omeprazole (PRILOSEC) 20 MG capsule Take 20 mg by mouth daily.     No current facility-administered medications for this visit.     Allergies as of 11/04/2016  . (No Known Allergies)    Vitals: BP 136/77   Pulse (!) 59   Ht 5\' 9"  (1.753 m)   Wt 180 lb (81.6 kg)   BMI 26.58 kg/m  Last Weight:  Wt Readings from Last 1 Encounters:  11/04/16 180 lb (81.6 kg)   Last Height:   Ht Readings from Last 1 Encounters:  11/04/16 5\' 9"  (1.753 m)    Physical exam: Exam: Gen: NAD, conversant, well nourised, well groomed                     CV: RRR, no MRG. No Carotid Bruits. No peripheral edema, warm, nontender Eyes: Conjunctivae clear without exudates or hemorrhage  Neuro: Detailed Neurologic Exam  Speech:    Speech is normal; fluent and spontaneous with normal comprehension.  Cognition:    The patient is oriented to person, place, and time;     recent and remote memory intact;     language fluent;     normal attention, concentration,     fund of knowledge Cranial Nerves:    The pupils are equal, round, and reactive to light. The fundi are normal and spontaneous venous pulsations are present. Visual fields are full to finger confrontation. Extraocular movements are intact. Trigeminal sensation is intact and the muscles of mastication are normal. The face is symmetric. The palate elevates in the midline. Hearing intact. Voice is normal. Shoulder shrug is normal. The tongue has normal motion without fasciculations.   Coordination:    Normal finger to nose and heel to shin. Normal rapid alternating movements.   Gait:    Heel-toe and tandem gait are  normal.   Motor Observation:    No asymmetry, no atrophy, and no involuntary movements noted. Tone:    Normal muscle tone.    Posture:    Posture is normal. normal erect    Strength:    Strength is V/V in the upper and lower limbs.      Sensation: intact  to LT     Reflex Exam:  DTR's:    Deep tendon reflexes in the upper and lower extremities are normal bilaterally.   Toes:    The toes are downgoing bilaterally.   Clonus:    Clonus is absent.      Assessment/Plan:  This is a 61 year old patient with an episode of peripheral vision loss, aphasia, vision changes confusion. He has a past medical history of heart palpitations unclear diagnosis but her cardiology notes they recommend echocardiogram, TEE and loop and I tend to agree. MRI of the brain was negative except for a pituitary mass and so we'll reorder MRI with pituitary protocol also need an MRA of the head to evaluate for any source of medium to large vessel emboli. He is going to see endocrinology who will likely test him for prolactin and other labs.  Patient has a history of migraines and this could be migrainous phenomena but feel that he also needs a full TIA/stroke workup. MRI w/ pituitary protocol, MRA head Needs TTE, TEE and possible loop due to possible aflutter/afib. See note from Dr. Orene Desanctis 10/17/2016: "He has had palpitations occasionally, proven using a commercial Kardia device to be frequent PACs. An implantable loop recorder would be greatly superior in detecting episodes of atrial fibrillation. If atrial fibrillation is detected he would meet criteria for full anticoagulation with a oral agent" He is seeing an endocrinologist Dr. Buddy Duty Will repeat Cholesterol, hgbA1c   I had a long d/w patient and wife about his recent event, risk for recurrent stroke/TIAs, personally independently reviewed imaging studies and stroke evaluation results and answered questions.Continue ASA 325mg  for secondary stroke prevention and  maintain strict control of hypertension with blood pressure goal below 130/90, diabetes with hemoglobin A1c goal below 6.5% and lipids with LDL cholesterol goal below 70 mg/dL. I also advised the patient to eat a healthy diet with plenty of whole grains, cereals, fruits and vegetables, exercise regularly and maintain ideal body weight .Followup in the future with me in 4 months or call earlier if necessary. We'll contact cardiology to see how I should proceed with the workup considering he is Dr. Griffin Dakin patient.  Orders Placed This Encounter  Procedures  . MR BRAIN W WO CONTRAST  . MR MRA HEAD WO CONTRAST  . Hemoglobin A1c  . Lipid panel     Sarina Ill, MD  [facility] [address] [place], [place] [address]  Phone [phone] Fax [phone]

## 2016-11-05 ENCOUNTER — Encounter: Payer: Managed Care, Other (non HMO) | Admitting: Gastroenterology

## 2016-11-06 ENCOUNTER — Other Ambulatory Visit: Payer: Self-pay | Admitting: Neurology

## 2016-11-06 ENCOUNTER — Telehealth: Payer: Self-pay | Admitting: Neurology

## 2016-11-06 NOTE — Telephone Encounter (Signed)
Spoke to Dr. Jaynee Eagles - she has ordered his test w/ pituitary protocol.  Returned call to Dr. Buddy Duty to let him know this has already been done.

## 2016-11-06 NOTE — Telephone Encounter (Signed)
I put pituitary protocol in the notes for the MRi reason, the pituitary lesion is actually why I am reordering the MRI. thanks

## 2016-11-06 NOTE — Telephone Encounter (Signed)
Dr Merry Proud [facility] [place] (c) [phone] (o) [phone] called said the pt has a pituitary cyst. He would like Dr Jaynee Eagles to add it the current MRI/MRA order so it can be checked at that time and the patient will not have to inconvenienced again. He can be reached at the above phone numbers.

## 2016-11-10 ENCOUNTER — Encounter: Payer: Self-pay | Admitting: Neurology

## 2016-11-11 ENCOUNTER — Encounter: Payer: Self-pay | Admitting: Cardiovascular Disease

## 2016-11-11 ENCOUNTER — Telehealth: Payer: Self-pay | Admitting: Cardiovascular Disease

## 2016-11-11 ENCOUNTER — Telehealth: Payer: Self-pay | Admitting: Nurse Practitioner

## 2016-11-11 NOTE — Telephone Encounter (Signed)
Left message for patient to call back. Reviewed patient's chart and looks like patient will be having his TEE and loop recorder placed on 11/18/16. Dr. Johnsie Cancel is doing the TEE test and Dr. Rayann Heman is putting in loop recorder.

## 2016-11-11 NOTE — Telephone Encounter (Signed)
New message     Pt was returning your call

## 2016-11-11 NOTE — Telephone Encounter (Signed)
Received referral from Dr Jaynee Eagles for Michael Lambert for evaluation of stroke.  Scheduled for Tuesday, July 3rd at [facility]. Instructions reviewed with patient. Questions answered.  Chanetta Marshall, NP 11/11/2016 9:03 AM

## 2016-11-11 NOTE — Telephone Encounter (Signed)
Called patient about his questions about his procedures on 11/18/16. Informed patient that both procedures are on 11/18/16. Patient verbalized understanding.

## 2016-11-11 NOTE — Telephone Encounter (Signed)
Follow Up:    Pt wants to know if he is going to have  A TEE and Loop recorder put in on 11-18-16? He wants to make sure he is having both procedures the same day.

## 2016-11-11 NOTE — Telephone Encounter (Signed)
Thank you :)

## 2016-11-13 ENCOUNTER — Encounter: Payer: Self-pay | Admitting: Neurology

## 2016-11-17 ENCOUNTER — Telehealth: Payer: Self-pay | Admitting: Neurology

## 2016-11-17 ENCOUNTER — Telehealth: Payer: Self-pay | Admitting: Cardiovascular Disease

## 2016-11-17 NOTE — Telephone Encounter (Signed)
Michael Lambert is calling to find out what he has to do to prepare for the Cath that he is having on tomorrow .  Please call him on his mobile number .Marland Kitchen  Thanks

## 2016-11-17 NOTE — Telephone Encounter (Signed)
Patient called to confirm TEE and Loop insertion tomorrow. Informed patient he will need to be NPO past midnight. Confirmed with patient he will need to arrive at the [facility] at 0730. He was grateful for call.  He also states he was recently diagnosed with hiatal hernia and was told that he needed to inform anyone performing procedures.   To Dr. Johnsie Cancel and Dr. Rayann Heman as Juluis Rainier. He understands he should tell MDs tomorrow as well.

## 2016-11-17 NOTE — Telephone Encounter (Signed)
This is a Dr. Jaynee Eagles patient. Baker Janus with [facility] just emailed me and informed me that Christella Scheuermann did not approve the MRI's and that it is pending a peer to peer. The case number is [id number] and the phone number for the peer to peer is [phone].  The patient is scheduled to have his MRI's done Thursday 11/20/16 at [place].

## 2016-11-18 ENCOUNTER — Ambulatory Visit (HOSPITAL_BASED_OUTPATIENT_CLINIC_OR_DEPARTMENT_OTHER): Payer: Managed Care, Other (non HMO)

## 2016-11-18 ENCOUNTER — Ambulatory Visit (HOSPITAL_COMMUNITY)
Admission: RE | Admit: 2016-11-18 | Discharge: 2016-11-18 | Disposition: A | Discharge status: Home / Self Care | Payer: Managed Care, Other (non HMO) | Source: Ambulatory Visit | Attending: Cardiovascular Disease | Admitting: Cardiovascular Disease

## 2016-11-18 ENCOUNTER — Encounter (HOSPITAL_COMMUNITY): Payer: Self-pay | Admitting: *Deleted

## 2016-11-18 ENCOUNTER — Encounter (HOSPITAL_COMMUNITY)
Admission: RE | Disposition: A | Discharge status: Home / Self Care | Payer: Self-pay | Source: Ambulatory Visit | Attending: Cardiovascular Disease

## 2016-11-18 DIAGNOSIS — Z79899 Other long term (current) drug therapy: Secondary | ICD-10-CM | POA: Diagnosis not present

## 2016-11-18 DIAGNOSIS — E785 Hyperlipidemia, unspecified: Secondary | ICD-10-CM | POA: Diagnosis not present

## 2016-11-18 DIAGNOSIS — G458 Other transient cerebral ischemic attacks and related syndromes: Secondary | ICD-10-CM

## 2016-11-18 DIAGNOSIS — R4701 Aphasia: Secondary | ICD-10-CM | POA: Diagnosis not present

## 2016-11-18 DIAGNOSIS — I340 Nonrheumatic mitral (valve) insufficiency: Secondary | ICD-10-CM | POA: Diagnosis not present

## 2016-11-18 DIAGNOSIS — F419 Anxiety disorder, unspecified: Secondary | ICD-10-CM | POA: Diagnosis not present

## 2016-11-18 DIAGNOSIS — Z820 Family history of epilepsy and other diseases of the nervous system: Secondary | ICD-10-CM | POA: Diagnosis not present

## 2016-11-18 DIAGNOSIS — E781 Pure hyperglyceridemia: Secondary | ICD-10-CM | POA: Diagnosis not present

## 2016-11-18 DIAGNOSIS — Q211 Atrial septal defect: Secondary | ICD-10-CM | POA: Diagnosis not present

## 2016-11-18 DIAGNOSIS — Z7982 Long term (current) use of aspirin: Secondary | ICD-10-CM | POA: Diagnosis not present

## 2016-11-18 DIAGNOSIS — N400 Enlarged prostate without lower urinary tract symptoms: Secondary | ICD-10-CM | POA: Insufficient documentation

## 2016-11-18 DIAGNOSIS — Z8249 Family history of ischemic heart disease and other diseases of the circulatory system: Secondary | ICD-10-CM | POA: Insufficient documentation

## 2016-11-18 DIAGNOSIS — K219 Gastro-esophageal reflux disease without esophagitis: Secondary | ICD-10-CM | POA: Insufficient documentation

## 2016-11-18 DIAGNOSIS — Z87891 Personal history of nicotine dependence: Secondary | ICD-10-CM | POA: Diagnosis not present

## 2016-11-18 DIAGNOSIS — Z8673 Personal history of transient ischemic attack (TIA), and cerebral infarction without residual deficits: Secondary | ICD-10-CM

## 2016-11-18 DIAGNOSIS — G459 Transient cerebral ischemic attack, unspecified: Secondary | ICD-10-CM | POA: Insufficient documentation

## 2016-11-18 DIAGNOSIS — G43909 Migraine, unspecified, not intractable, without status migrainosus: Secondary | ICD-10-CM | POA: Insufficient documentation

## 2016-11-18 HISTORY — PX: TEE WITHOUT CARDIOVERSION: SHX5443

## 2016-11-18 HISTORY — PX: LOOP RECORDER INSERTION: EP1214

## 2016-11-18 SURGERY — LOOP RECORDER INSERTION

## 2016-11-18 SURGERY — ECHOCARDIOGRAM, TRANSESOPHAGEAL
Anesthesia: Moderate Sedation

## 2016-11-18 MED ORDER — LIDOCAINE-EPINEPHRINE 1 %-1:100000 IJ SOLN
INTRAMUSCULAR | Status: AC
  Filled 2016-11-18: qty 1

## 2016-11-18 MED ORDER — FENTANYL CITRATE (PF) 100 MCG/2ML IJ SOLN
INTRAMUSCULAR | Status: DC | PRN
  Administered 2016-11-18 (×3): 25 ug via INTRAVENOUS

## 2016-11-18 MED ORDER — SODIUM CHLORIDE 0.9 % IV SOLN
INTRAVENOUS | Status: DC
  Administered 2016-11-18: 08:00:00 via INTRAVENOUS

## 2016-11-18 MED ORDER — MIDAZOLAM HCL 10 MG/2ML IJ SOLN
INTRAMUSCULAR | Status: DC | PRN
  Administered 2016-11-18 (×3): 2 mg via INTRAVENOUS

## 2016-11-18 MED ORDER — FENTANYL CITRATE (PF) 100 MCG/2ML IJ SOLN
INTRAMUSCULAR | Status: AC
  Filled 2016-11-18: qty 2

## 2016-11-18 MED ORDER — MIDAZOLAM HCL 5 MG/ML IJ SOLN
INTRAMUSCULAR | Status: AC
  Filled 2016-11-18: qty 2

## 2016-11-18 MED ORDER — BUTAMBEN-TETRACAINE-BENZOCAINE 2-2-14 % EX AERO
INHALATION_SPRAY | CUTANEOUS | Status: DC | PRN
  Administered 2016-11-18: 2 via TOPICAL

## 2016-11-18 MED ORDER — LIDOCAINE-EPINEPHRINE 1 %-1:100000 IJ SOLN
INTRAMUSCULAR | Status: DC | PRN
  Administered 2016-11-18: 20 mL

## 2016-11-18 SURGICAL SUPPLY — 2 items
LOOP REVEAL LINQSYS (Prosthesis & Implant Heart) ×2 IMPLANT
PACK LOOP INSERTION (CUSTOM PROCEDURE TRAY) ×3 IMPLANT

## 2016-11-18 NOTE — Telephone Encounter (Signed)
Michael Lambert called to schedule peer to peer and was informed both MRI scans were approved .  Michael Lambert # [id number]  And [id number] / [id number] auth

## 2016-11-18 NOTE — Telephone Encounter (Signed)
Noted, thank you

## 2016-11-18 NOTE — Interval H&P Note (Signed)
History and Physical Interval Note:  11/18/2016 9:55 AM  Michael Lambert  has presented today for surgery, with the diagnosis of stroke  The various methods of treatment have been discussed with the patient and family. After consideration of risks, benefits and other options for treatment, the patient has consented to  Procedure(s): Loop Recorder Insertion (N/A) as a surgical intervention .  The patient's history has been reviewed, patient examined, no change in status, stable for surgery.  I have reviewed the patient's chart and labs.  Questions were answered to the patient's satisfaction.     Thompson Grayer

## 2016-11-18 NOTE — CV Procedure (Signed)
    Transesophageal Echocardiogram Note  Michael Lambert [id number] [birth year]  Procedure: Transesophageal Echocardiogram Indications: TIA   Procedure Details Consent: Obtained Time Out: Verified patient identification, verified procedure, site/side was marked, verified correct patient position, special equipment/implants available, Radiology Safety Procedures followed,  medications/allergies/relevent history reviewed, required imaging and test results available.  Performed  Medications:  During this procedure the patient is administered a total of Versed 6 mg and Fentanyl 75 mcg  to achieve and maintain moderate conscious sedation.  The patient's heart rate, blood pressure, and oxygen saturation are monitored continuously during the procedure. The period of conscious sedation is 30  minutes, of which I was present face-to-face 100% of this time.  Left Ventrical:  Normal LV function   Mitral Valve: trace - mild MR   Aortic Valve: normal   Tricuspid Valve: trace TR   Pulmonic Valve: trivial PI  Left Atrium/ Left atrial appendage: no thrombi   Atrial septum: + small PFO  Aorta: normal    Complications: No apparent complications Patient did tolerate procedure well.   Michael Lambert, Michael Lambert., MD, [facility] 11/18/2016, 9:13 AM

## 2016-11-18 NOTE — H&P (View-Only) (Signed)
[facility]    Provider:  Dr Jaynee Eagles Referring Provider: Josetta Huddle, MD Primary Care Physician:  Josetta Huddle, MD  CC:  TIA vs Migraine  HPI:  Michael Lambert is a 61 y.o. male here as a referral from Dr. Inda Merlin for TIA. Patient was seen in the emergency room for transient episode of blurred vision.  He is here with his wife. He is active and healthy. On memorial day weekend he felt "fuzzy". Also had some subtle loss of peripheral vision on the right, blurry not vision loss. Wife knew something was "off". He knew what he wanted to say but could not form the words. He spoke non-sensical language, he was taken to [facility] and had some slurred speech there. He couldn't tell them who the president was. He was improving. No code stroke was called per patient. He was seen quickly and went straight to cat scan and that was negative. He was not admitted for a workup. This has never happened to him before but he does have a hx of migraines at the age of 47. He has a nephew who had a TIA. He sees Dr. Orene Desanctis who has recommended TEE and loop. No other focal neurologic deficits, associated symptoms, inciting events or modifiable factors.  Reviewed notes, labs and imaging from outside physicians, which showed:  Patient was seen in the emergency room for transient episode of blurred vision followed by "jumbled up words". This started about 4:30 in 10/13/2016. When she was seen in the emergency room symptoms had resolved. No prior similar problems. He also described a fuzzy head with some blurriness in the right extreme peripheral visual field. No left eye symptoms. He walked into the living room where his wife was and she asked him what was wrong. He said 2 words that made no sense. She merely went to get her daughter and he was able to speak. He also has some word finding trouble. No stroke was seen on CT of the head.  MRI of the brain, personally reviewed images, showed no acute changes but  did show a cystic lesion of the pituitary gland which needs further workup. Ultrasound carotid duplex bilaterally did not show any significant stenosis of the carotid arteries.  TSH 04/2016 was normal. Cholesterol panel in July 2017 showed elevated triglycerides 161 and elevated LDL 106.   Review of Systems: Patient complains of symptoms per HPI as well as the following symptoms: no CP, no SOB. Pertinent negatives and positives per HPI. All others negative.   Social History   Social History  . Marital status: Married    Spouse name: N/A  . Number of children: N/A  . Years of education: N/A   Occupational History  . Not on file.   Social History Main Topics  . Smoking status: Former Smoker    Quit date: 05/19/1980  . Smokeless tobacco: Never Used  . Alcohol use 0.0 oz/week     Comment: 4 glasses of wine each day  . Drug use: No  . Sexual activity: Not on file   Other Topics Concern  . Not on file   Social History Narrative  . No narrative on file    Family History  Problem Relation Age of Onset  . Multiple sclerosis Unknown        family hx  . Hyperlipidemia Father   . Colon cancer Neg Hx   . Esophageal cancer Neg Hx   . Rectal cancer Neg Hx   . Stomach cancer  Neg Hx     Past Medical History:  Diagnosis Date  . Anxiety    sees Dr. Pearson Grippe   . Benign prostatic hypertrophy   . GERD (gastroesophageal reflux disease)   . Hyperlipidemia   . Migraine     Past Surgical History:  Procedure Laterality Date  . APPENDECTOMY    . colonoscopy  10-08-12   per Dr. Deatra Ina, adenomatous polyps, repeat in 5 yrs   . CYSTOSCOPY     per Dr. Rosana Hoes  . HERNIA REPAIR  2008   bilateral inguinal, per Dr. Zella Richer  . PALATE / UVULA BIOPSY / EXCISION  2004   per Dr. Lucia Gaskins, for snoring   . RADIAL KERATOTOMY     sees Dr. Mercer Pod at [facility] , bilateral  . repair detached retina    . TONSILLECTOMY      Current Outpatient Prescriptions  Medication Sig Dispense  Refill  . aspirin 325 MG tablet Take 325 mg by mouth daily.    Marland Kitchen atorvastatin (LIPITOR) 40 MG tablet TAKE 1 TABLET BY MOUTH ONCE DAILY 30 tablet 2  . metoprolol succinate (TOPROL-XL) 25 MG 24 hr tablet Take 0.5 tablets (12.5 mg total) by mouth daily. (Patient taking differently: Take 12.5 mg by mouth 2 (two) times daily. ) 45 tablet 3  . omeprazole (PRILOSEC) 20 MG capsule Take 20 mg by mouth daily.     No current facility-administered medications for this visit.     Allergies as of 11/04/2016  . (No Known Allergies)    Vitals: BP 136/77   Pulse (!) 59   Ht 5\' 9"  (1.753 m)   Wt 180 lb (81.6 kg)   BMI 26.58 kg/m  Last Weight:  Wt Readings from Last 1 Encounters:  11/04/16 180 lb (81.6 kg)   Last Height:   Ht Readings from Last 1 Encounters:  11/04/16 5\' 9"  (1.753 m)    Physical exam: Exam: Gen: NAD, conversant, well nourised, well groomed                     CV: RRR, no MRG. No Carotid Bruits. No peripheral edema, warm, nontender Eyes: Conjunctivae clear without exudates or hemorrhage  Neuro: Detailed Neurologic Exam  Speech:    Speech is normal; fluent and spontaneous with normal comprehension.  Cognition:    The patient is oriented to person, place, and time;     recent and remote memory intact;     language fluent;     normal attention, concentration,     fund of knowledge Cranial Nerves:    The pupils are equal, round, and reactive to light. The fundi are normal and spontaneous venous pulsations are present. Visual fields are full to finger confrontation. Extraocular movements are intact. Trigeminal sensation is intact and the muscles of mastication are normal. The face is symmetric. The palate elevates in the midline. Hearing intact. Voice is normal. Shoulder shrug is normal. The tongue has normal motion without fasciculations.   Coordination:    Normal finger to nose and heel to shin. Normal rapid alternating movements.   Gait:    Heel-toe and tandem gait are  normal.   Motor Observation:    No asymmetry, no atrophy, and no involuntary movements noted. Tone:    Normal muscle tone.    Posture:    Posture is normal. normal erect    Strength:    Strength is V/V in the upper and lower limbs.      Sensation: intact  to LT     Reflex Exam:  DTR's:    Deep tendon reflexes in the upper and lower extremities are normal bilaterally.   Toes:    The toes are downgoing bilaterally.   Clonus:    Clonus is absent.      Assessment/Plan:  This is a 61 year old patient with an episode of peripheral vision loss, aphasia, vision changes confusion. He has a past medical history of heart palpitations unclear diagnosis but her cardiology notes they recommend echocardiogram, TEE and loop and I tend to agree. MRI of the brain was negative except for a pituitary mass and so we'll reorder MRI with pituitary protocol also need an MRA of the head to evaluate for any source of medium to large vessel emboli. He is going to see endocrinology who will likely test him for prolactin and other labs.  Patient has a history of migraines and this could be migrainous phenomena but feel that he also needs a full TIA/stroke workup. MRI w/ pituitary protocol, MRA head Needs TTE, TEE and possible loop due to possible aflutter/afib. See note from Dr. Orene Desanctis 10/17/2016: "He has had palpitations occasionally, proven using a commercial Kardia device to be frequent PACs. An implantable loop recorder would be greatly superior in detecting episodes of atrial fibrillation. If atrial fibrillation is detected he would meet criteria for full anticoagulation with a oral agent" He is seeing an endocrinologist Dr. Buddy Duty Will repeat Cholesterol, hgbA1c   I had a long d/w patient and wife about his recent event, risk for recurrent stroke/TIAs, personally independently reviewed imaging studies and stroke evaluation results and answered questions.Continue ASA 325mg  for secondary stroke prevention and  maintain strict control of hypertension with blood pressure goal below 130/90, diabetes with hemoglobin A1c goal below 6.5% and lipids with LDL cholesterol goal below 70 mg/dL. I also advised the patient to eat a healthy diet with plenty of whole grains, cereals, fruits and vegetables, exercise regularly and maintain ideal body weight .Followup in the future with me in 4 months or call earlier if necessary. We'll contact cardiology to see how I should proceed with the workup considering he is Dr. Griffin Dakin patient.  Orders Placed This Encounter  Procedures  . MR BRAIN W WO CONTRAST  . MR MRA HEAD WO CONTRAST  . Hemoglobin A1c  . Lipid panel     Sarina Ill, MD  [facility] [address] [place], [place] [address]  Phone [phone] Fax [phone]

## 2016-11-18 NOTE — Interval H&P Note (Signed)
History and Physical Interval Note:  11/18/2016 8:30 AM  [name]  has presented today for surgery, with the diagnosis of stroke  The various methods of treatment have been discussed with the patient and family. After consideration of risks, benefits and other options for treatment, the patient has consented to  Procedure(s): TRANSESOPHAGEAL ECHOCARDIOGRAM (TEE) (N/A) as a surgical intervention .  The patient's history has been reviewed, patient examined, no change in status, stable for surgery.  I have reviewed the patient's chart and labs.  Questions were answered to the patient's satisfaction.     Michael Lambert

## 2016-11-18 NOTE — Progress Notes (Signed)
Preliminary results by tech - Venous Duplex Lower Ext. Completed. Negative for deep and superficial vein thrombosis in both legs.  Emalyn Schou, BS, RDMS, RVT  

## 2016-11-18 NOTE — H&P (View-Only) (Signed)
ELECTROPHYSIOLOGY CONSULT NOTE  Patient ID: Michael Lambert MRN: [id number], DOB/AGE: 1955/09/27   Admit date: 11/18/2016 Date of Consult: 11/18/2016  Primary Physician: Josetta Huddle, MD Primary Cardiologist: Croitoru Reason for Consultation: Cryptogenic TIA; recommendations regarding Implantable Loop Recorder  History of Present Illness Michael Lambert is a 61 y.o. male whom EP has been asked to see by Dr Jaynee Eagles for evaluation of ILR placement in the setting of cryptogenic TIA.  On Memorial Day weekend, he felt "fuzzy" and had some loss of vision on the right.  He also had transient expressive aphasia. He has not had recurrent symptoms. There were no obvious triggers. CT scan was negative. He was seen by Dr Jaynee Eagles who recommended evaluation with TEE and implantable loop recorder.     Echocardiogram 02/2016 demonstrated EF 60-65%, LA mildly dilated, no PFO.  Lab work is reviewed.  Prior to admission, the patient denies chest pain, shortness of breath, dizziness, palpitations, or syncope.   EP has been asked to evaluate for placement of an implantable loop recorder to monitor for atrial fibrillation.  Past Medical History:  Diagnosis Date  . Anxiety    sees Dr. Pearson Grippe   . Benign prostatic hypertrophy   . GERD (gastroesophageal reflux disease)   . Hyperlipidemia   . Migraine      Surgical History:  Past Surgical History:  Procedure Laterality Date  . APPENDECTOMY    . colonoscopy  10-08-12   per Dr. Deatra Ina, adenomatous polyps, repeat in 5 yrs   . CYSTOSCOPY     per Dr. Rosana Hoes  . HERNIA REPAIR  2008   bilateral inguinal, per Dr. Zella Richer  . PALATE / UVULA BIOPSY / EXCISION  2004   per Dr. Lucia Gaskins, for snoring   . RADIAL KERATOTOMY     sees Dr. Mercer Pod at [facility] , bilateral  . repair detached retina    . TONSILLECTOMY       Prescriptions Prior to Admission  Medication Sig Dispense Refill Last Dose  . aspirin 325 MG tablet Take 325 mg by mouth daily.   11/17/2016 at  Unknown time  . atorvastatin (LIPITOR) 80 MG tablet Take 80 mg by mouth daily.   11/17/2016 at Unknown time  . cholecalciferol (VITAMIN D) 1000 units tablet Take 1,000 Units by mouth daily.   11/17/2016 at Unknown time  . metoprolol succinate (TOPROL-XL) 25 MG 24 hr tablet Take 0.5 tablets (12.5 mg total) by mouth daily. (Patient taking differently: Take 12.5 mg by mouth 2 (two) times daily. ) 45 tablet 3 11/17/2016 at Unknown time  . Omega 3 1200 MG CAPS Take 2 capsules by mouth daily.   11/17/2016 at Unknown time  . omeprazole (PRILOSEC) 20 MG capsule Take 20 mg by mouth daily.   11/17/2016 at Unknown time    Inpatient Medications:   Allergies: No Known Allergies  Social History   Social History  . Marital status: Married    Spouse name: N/A  . Number of children: N/A  . Years of education: N/A   Occupational History  . Not on file.   Social History Main Topics  . Smoking status: Former Smoker    Quit date: 05/19/1980  . Smokeless tobacco: Never Used  . Alcohol use 0.0 oz/week     Comment: 4 glasses of wine each day  . Drug use: No  . Sexual activity: Not on file   Other Topics Concern  . Not on file   Social History Narrative  . No  narrative on file     Family History  Problem Relation Age of Onset  . Multiple sclerosis Unknown        family hx  . Hyperlipidemia Father   . Colon cancer Neg Hx   . Esophageal cancer Neg Hx   . Rectal cancer Neg Hx   . Stomach cancer Neg Hx       Review of Systems: All other systems reviewed and are otherwise negative except as noted above.  Physical Exam: Vitals:   11/18/16 0755  BP: 138/72  Pulse: 60  Resp: 12  TempSrc: Oral  SpO2: 97%  Weight: 180 lb (81.6 kg)  Height: 5\' 9"  (1.753 m)    GEN- The patient is well appearing, alert and oriented x 3 today.   Head- normocephalic, atraumatic Eyes-  Sclera clear, conjunctiva pink Ears- hearing intact Oropharynx- clear Neck- supple Lungs- Clear to ausculation bilaterally, normal  work of breathing Heart- Regular rate and rhythm, no murmurs, rubs or gallops  GI- soft, NT, ND, + BS Extremities- no clubbing, cyanosis, or edema MS- no significant deformity or atrophy Skin- no rash or lesion Psych- euthymic mood, full affect   Labs:   Lab Results  Component Value Date   WBC 7.1 10/13/2016   HGB 14.6 10/13/2016   HCT 43.0 10/13/2016   MCV 96.7 10/13/2016   PLT 185 10/13/2016   No results for input(s): NA, K, CL, CO2, BUN, CREATININE, CALCIUM, PROT, BILITOT, ALKPHOS, ALT, AST, GLUCOSE in the last 168 hours.  Invalid input(s): LABALBU  12-lead ECG sinus rhythm All prior EKG's in EPIC reviewed with no documented atrial fibrillation  Assessment and Plan:  1. Cryptogenic TIA The patient has been diagnosed with cryptogenic TIA by Dr Jaynee Eagles.  The patient has a TEE planned for this AM.  I spoke at length with the patient about monitoring for afib with either a 30 day event monitor or an implantable loop recorder.  Risks, benefits, and alteratives to implantable loop recorder were discussed with the patient today.   At this time, the patient is very clear in their decision to proceed with implantable loop recorder.   Please call with questions.   Chanetta Marshall, NP 11/18/2016 8:05 AM   I have seen, examined the patient, and reviewed the above assessment and plan. On exam, RRR.  Changes to above are made where necessary.  Awaiting venous dopplers given PFO on TEE.  If dopplers are negative, will proceed with ILR.  ILR implant and follow-up discussed at length with the patient and his spouse.  Co Sign: Thompson Grayer, MD 11/18/2016 9:53 AM

## 2016-11-18 NOTE — Telephone Encounter (Signed)
Correction: Rn call for peer to peer. The rep stated both test were approve yesterday. Josem Kaufmann is A [id number].

## 2016-11-18 NOTE — Consult Note (Signed)
ELECTROPHYSIOLOGY CONSULT NOTE  Patient ID: Michael Lambert MRN: [id number], DOB/AGE: 08/10/1955   Admit date: 11/18/2016 Date of Consult: 11/18/2016  Primary Physician: Josetta Huddle, MD Primary Cardiologist: Croitoru Reason for Consultation: Cryptogenic TIA; recommendations regarding Implantable Loop Recorder  History of Present Illness Michael Lambert is a 61 y.o. male whom EP has been asked to see by Dr Jaynee Eagles for evaluation of ILR placement in the setting of cryptogenic TIA.  On Memorial Day weekend, he felt "fuzzy" and had some loss of vision on the right.  He also had transient expressive aphasia. He has not had recurrent symptoms. There were no obvious triggers. CT scan was negative. He was seen by Dr Jaynee Eagles who recommended evaluation with TEE and implantable loop recorder.     Echocardiogram 02/2016 demonstrated EF 60-65%, LA mildly dilated, no PFO.  Lab work is reviewed.  Prior to admission, the patient denies chest pain, shortness of breath, dizziness, palpitations, or syncope.   EP has been asked to evaluate for placement of an implantable loop recorder to monitor for atrial fibrillation.  Past Medical History:  Diagnosis Date  . Anxiety    sees Dr. Pearson Grippe   . Benign prostatic hypertrophy   . GERD (gastroesophageal reflux disease)   . Hyperlipidemia   . Migraine      Surgical History:  Past Surgical History:  Procedure Laterality Date  . APPENDECTOMY    . colonoscopy  10-08-12   per Dr. Deatra Ina, adenomatous polyps, repeat in 5 yrs   . CYSTOSCOPY     per Dr. Rosana Hoes  . HERNIA REPAIR  2008   bilateral inguinal, per Dr. Zella Richer  . PALATE / UVULA BIOPSY / EXCISION  2004   per Dr. Lucia Gaskins, for snoring   . RADIAL KERATOTOMY     sees Dr. Mercer Pod at [facility] , bilateral  . repair detached retina    . TONSILLECTOMY       Prescriptions Prior to Admission  Medication Sig Dispense Refill Last Dose  . aspirin 325 MG tablet Take 325 mg by mouth daily.   11/17/2016 at  Unknown time  . atorvastatin (LIPITOR) 80 MG tablet Take 80 mg by mouth daily.   11/17/2016 at Unknown time  . cholecalciferol (VITAMIN D) 1000 units tablet Take 1,000 Units by mouth daily.   11/17/2016 at Unknown time  . metoprolol succinate (TOPROL-XL) 25 MG 24 hr tablet Take 0.5 tablets (12.5 mg total) by mouth daily. (Patient taking differently: Take 12.5 mg by mouth 2 (two) times daily. ) 45 tablet 3 11/17/2016 at Unknown time  . Omega 3 1200 MG CAPS Take 2 capsules by mouth daily.   11/17/2016 at Unknown time  . omeprazole (PRILOSEC) 20 MG capsule Take 20 mg by mouth daily.   11/17/2016 at Unknown time    Inpatient Medications:   Allergies: No Known Allergies  Social History   Social History  . Marital status: Married    Spouse name: N/A  . Number of children: N/A  . Years of education: N/A   Occupational History  . Not on file.   Social History Main Topics  . Smoking status: Former Smoker    Quit date: 05/19/1980  . Smokeless tobacco: Never Used  . Alcohol use 0.0 oz/week     Comment: 4 glasses of wine each day  . Drug use: No  . Sexual activity: Not on file   Other Topics Concern  . Not on file   Social History Narrative  . No  narrative on file     Family History  Problem Relation Age of Onset  . Multiple sclerosis Unknown        family hx  . Hyperlipidemia Father   . Colon cancer Neg Hx   . Esophageal cancer Neg Hx   . Rectal cancer Neg Hx   . Stomach cancer Neg Hx       Review of Systems: All other systems reviewed and are otherwise negative except as noted above.  Physical Exam: Vitals:   11/18/16 0755  BP: 138/72  Pulse: 60  Resp: 12  TempSrc: Oral  SpO2: 97%  Weight: 180 lb (81.6 kg)  Height: 5\' 9"  (1.753 m)    GEN- The patient is well appearing, alert and oriented x 3 today.   Head- normocephalic, atraumatic Eyes-  Sclera clear, conjunctiva pink Ears- hearing intact Oropharynx- clear Neck- supple Lungs- Clear to ausculation bilaterally, normal  work of breathing Heart- Regular rate and rhythm, no murmurs, rubs or gallops  GI- soft, NT, ND, + BS Extremities- no clubbing, cyanosis, or edema MS- no significant deformity or atrophy Skin- no rash or lesion Psych- euthymic mood, full affect   Labs:   Lab Results  Component Value Date   WBC 7.1 10/13/2016   HGB 14.6 10/13/2016   HCT 43.0 10/13/2016   MCV 96.7 10/13/2016   PLT 185 10/13/2016   No results for input(s): NA, K, CL, CO2, BUN, CREATININE, CALCIUM, PROT, BILITOT, ALKPHOS, ALT, AST, GLUCOSE in the last 168 hours.  Invalid input(s): LABALBU  12-lead ECG sinus rhythm All prior EKG's in EPIC reviewed with no documented atrial fibrillation  Assessment and Plan:  1. Cryptogenic TIA The patient has been diagnosed with cryptogenic TIA by Dr Jaynee Eagles.  The patient has a TEE planned for this AM.  I spoke at length with the patient about monitoring for afib with either a 30 day event monitor or an implantable loop recorder.  Risks, benefits, and alteratives to implantable loop recorder were discussed with the patient today.   At this time, the patient is very clear in their decision to proceed with implantable loop recorder.   Please call with questions.   Chanetta Marshall, NP 11/18/2016 8:05 AM   I have seen, examined the patient, and reviewed the above assessment and plan. On exam, RRR.  Changes to above are made where necessary.  Awaiting venous dopplers given PFO on TEE.  If dopplers are negative, will proceed with ILR.  ILR implant and follow-up discussed at length with the patient and his spouse.  Co Sign: Thompson Grayer, MD 11/18/2016 9:53 AM

## 2016-11-19 ENCOUNTER — Encounter (HOSPITAL_COMMUNITY): Payer: Self-pay | Admitting: Cardiovascular Disease

## 2016-11-20 ENCOUNTER — Ambulatory Visit
Admission: RE | Admit: 2016-11-20 | Discharge: 2016-11-20 | Disposition: A | Discharge status: Home / Self Care | Payer: Managed Care, Other (non HMO) | Source: Ambulatory Visit | Attending: Neurology | Admitting: Neurology

## 2016-11-20 DIAGNOSIS — H547 Unspecified visual loss: Secondary | ICD-10-CM | POA: Diagnosis not present

## 2016-11-20 DIAGNOSIS — R4701 Aphasia: Secondary | ICD-10-CM

## 2016-11-20 DIAGNOSIS — R410 Disorientation, unspecified: Secondary | ICD-10-CM

## 2016-11-20 DIAGNOSIS — R404 Transient alteration of awareness: Secondary | ICD-10-CM

## 2016-11-20 DIAGNOSIS — E236 Other disorders of pituitary gland: Secondary | ICD-10-CM

## 2016-11-20 MED ORDER — ALPRAZOLAM 0.5 MG PO TABS: ORAL_TABLET | ORAL | 0 refills | Status: DC

## 2016-11-20 MED ORDER — GADOBENATE DIMEGLUMINE 529 MG/ML IV SOLN
10.0000 mL | Freq: Once | INTRAVENOUS | Status: AC | PRN
  Administered 2016-11-20: 10 mL via INTRAVENOUS

## 2016-11-20 NOTE — Telephone Encounter (Signed)
Xanax rx printed, signed and faxed to pharmacy.

## 2016-11-27 ENCOUNTER — Other Ambulatory Visit (HOSPITAL_COMMUNITY): Payer: Managed Care, Other (non HMO)

## 2016-12-03 ENCOUNTER — Ambulatory Visit (INDEPENDENT_AMBULATORY_CARE_PROVIDER_SITE_OTHER): Payer: Self-pay | Admitting: *Deleted

## 2016-12-03 DIAGNOSIS — G459 Transient cerebral ischemic attack, unspecified: Secondary | ICD-10-CM

## 2016-12-03 DIAGNOSIS — R002 Palpitations: Secondary | ICD-10-CM

## 2016-12-03 LAB — CUP PACEART INCLINIC DEVICE CHECK
MDC IDC PG IMPLANT DT: 20180703
MDC IDC SESS DTM: 20180718093125

## 2016-12-03 NOTE — Progress Notes (Signed)
Wound check appointment. Steri-strips previously removed by patient. Wound without redness or edema. Incision edges approximated, wound well healed. Normal device function. Battery status: good. R-waves 0.32mV. No symptom, tachy, or AF episodes. Pause and brady detection remain off. Patient educated about wound care and Carelink monitor. Monthly summary reports and ROV with [place] in 05/2017.

## 2016-12-04 ENCOUNTER — Telehealth: Payer: Self-pay

## 2016-12-04 NOTE — Telephone Encounter (Signed)
Received faxed notes (which are also in EPIC) from Dr. Ovid Curd @ [facility]. Placed in Dr. Cathren Laine file for review.

## 2016-12-18 ENCOUNTER — Ambulatory Visit (INDEPENDENT_AMBULATORY_CARE_PROVIDER_SITE_OTHER): Payer: Managed Care, Other (non HMO) | Admitting: *Deleted

## 2016-12-18 DIAGNOSIS — G459 Transient cerebral ischemic attack, unspecified: Secondary | ICD-10-CM

## 2016-12-19 NOTE — Progress Notes (Signed)
Carelink Summary Report / Loop Recorder 

## 2016-12-22 ENCOUNTER — Encounter: Payer: Managed Care, Other (non HMO) | Admitting: Gastroenterology

## 2016-12-29 LAB — CUP PACEART REMOTE DEVICE CHECK
Implantable Pulse Generator Implant Date: 20180703
MDC IDC SESS DTM: 20180802173708

## 2017-01-02 ENCOUNTER — Encounter: Payer: Self-pay | Admitting: Cardiovascular Disease

## 2017-01-14 ENCOUNTER — Encounter: Payer: Managed Care, Other (non HMO) | Admitting: Gastroenterology

## 2017-01-15 ENCOUNTER — Encounter: Payer: Managed Care, Other (non HMO) | Admitting: Gastroenterology

## 2017-01-20 ENCOUNTER — Ambulatory Visit (INDEPENDENT_AMBULATORY_CARE_PROVIDER_SITE_OTHER): Payer: Managed Care, Other (non HMO) | Admitting: *Deleted

## 2017-01-20 DIAGNOSIS — G459 Transient cerebral ischemic attack, unspecified: Secondary | ICD-10-CM

## 2017-01-21 ENCOUNTER — Telehealth: Payer: Self-pay | Admitting: Cardiology

## 2017-01-21 NOTE — Telephone Encounter (Signed)
Spoke w/ pt and requested that he send a manual transmission b/c his home monitor has not updated in at least 14 days.   

## 2017-01-22 ENCOUNTER — Encounter: Payer: Self-pay | Admitting: Cardiovascular Disease

## 2017-01-22 ENCOUNTER — Encounter: Payer: Self-pay | Admitting: *Deleted

## 2017-01-22 NOTE — Progress Notes (Signed)
Convincing atrial fibrillation. ASA not enough. Please have him start Xarelto 20 mg with evening meal daily. Then stop ASA. Give 30 day coupon and arrange follow up with me please.

## 2017-01-22 NOTE — Progress Notes (Unsigned)
I called Michael Lambert with the results from his last loop recorder that showed paroxysmal atrial fibrillation We discussed the risk of recurrent TIA and stroke. Advised him to start Xarelto 20 mg with dinner daily and stop aspirin. Discussed pros and cons of anticoagulation therapy, particularly the risk of bleeding. He has an endoscopy scheduled in a few weeks. Should be OK to stop Xarelto for 48 hours before the procedure. Will keep our November appointment.  Sanda Klein, MD, [facility] [phone] office [phone] pager

## 2017-01-22 NOTE — Progress Notes (Signed)
Carelink Summary Report / Loop Recorder 

## 2017-01-22 NOTE — Progress Notes (Unsigned)
       Patient implanted for cryptogenic stroke. ASA 325 mg. ECG appears AF. Message sent to Dr. Sallyanne Kuster for further recommendations.

## 2017-01-26 ENCOUNTER — Other Ambulatory Visit: Payer: Self-pay

## 2017-01-26 LAB — CUP PACEART REMOTE DEVICE CHECK
MDC IDC PG IMPLANT DT: 20180703
MDC IDC SESS DTM: 20180901173846

## 2017-01-26 MED ORDER — RIVAROXABAN 20 MG PO TABS: 20.0000 mg | ORAL_TABLET | Freq: Every day | ORAL | 3 refills | Status: DC

## 2017-01-26 NOTE — Telephone Encounter (Signed)
Per Dr Loletha Grayer - Convincing atrial fibrillation. ASA not enough. Please have him start Xarelto 20 mg with evening meal daily. Then stop ASA. Give 30 day coupon and arrange follow up with me please.  Patient notified. See Dr Lurline Del documentation note for details. Rx(s) sent to pharmacy electronically. Medication list updated.

## 2017-01-27 ENCOUNTER — Encounter: Payer: Self-pay | Admitting: Cardiovascular Disease

## 2017-02-02 ENCOUNTER — Encounter: Payer: Self-pay | Admitting: Cardiovascular Disease

## 2017-02-03 ENCOUNTER — Telehealth: Payer: Self-pay

## 2017-02-03 ENCOUNTER — Telehealth: Payer: Self-pay | Admitting: *Deleted

## 2017-02-03 NOTE — Telephone Encounter (Signed)
Routed to Ulice Dash, RN in endoscopy pre-visit.

## 2017-02-03 NOTE — Telephone Encounter (Signed)
Dr Havery Moros: pt is scheduled for EGD for GERD 02/25/17.  Pt had Ov with you 07/17/16 and was scheduled for EGD.  Pt cancelled procedure and rescheduled 4 times times since OV.  Since seeing you in office he has had cardiac issues and is now taking Xarelto.  Do you want him to have OV before scheduling procedure?  Thanks, Juliann Pulse in [facility]

## 2017-02-03 NOTE — Telephone Encounter (Signed)
For you Michael Lambert

## 2017-02-03 NOTE — Telephone Encounter (Signed)
Thanks for the note.  I have reviewed his chart, I think he is okay to proceed with EGD as scheduled. He is taking Xarelto, and he will need approval to hold it 2 days prior to the procedure if you can reach out to them about it. Thanks

## 2017-02-03 NOTE — Telephone Encounter (Signed)
  02/03/2017   RE: Michael Lambert DOB: 1955-12-18 MRN: [id number]    We have scheduled the above patient for an endoscopic procedure (EGD). Our records show that he is on anticoagulation therapy.   Please advise if it is okay for the patient may come off his therapy of Xarelto 2 days prior to the procedure, which is scheduled for 02/25/17.    Sincerely,    Doristine Counter

## 2017-02-03 NOTE — Telephone Encounter (Signed)
Patient with diagnosis of atrial fibrillation on Xarelto for anticoagulation.    Procedure: endoscopy Date of procedure: 03/05/17  CHADS2 score of 1  (HTN);  CHADS2-VASc score of  2 (HTN, AGE)  CrCl 88.4 Platelet count 185  Per office protocol, patient can hold Xarelto for 2 days prior to procedure.    Patient should restart Xarelto on the evening of procedure or day after, at discretion of procedure MD

## 2017-02-11 ENCOUNTER — Ambulatory Visit (AMBULATORY_SURGERY_CENTER): Payer: Self-pay | Admitting: *Deleted

## 2017-02-11 VITALS — Ht 69.0 in | Wt 176.6 lb

## 2017-02-11 DIAGNOSIS — K219 Gastro-esophageal reflux disease without esophagitis: Secondary | ICD-10-CM

## 2017-02-11 NOTE — Progress Notes (Signed)
No egg or soy allergy known to patient  No issues with past sedation with any surgeries  or procedures, no intubation problems  No diet pills per patient No home 02 use per patient  Yes to  blood thinners per patient - pt on Xarelto daily-- ok to hold for 2 days prior to egd per K alvstad - see te  Pt denies issues with constipation  Positive  A fib and pt on xarelto- flutter is ongoing per pt. Pt has loop recorder   EMMI video sent to pt's e mail

## 2017-02-13 ENCOUNTER — Encounter: Payer: Self-pay | Admitting: Gastroenterology

## 2017-02-16 ENCOUNTER — Ambulatory Visit (INDEPENDENT_AMBULATORY_CARE_PROVIDER_SITE_OTHER): Payer: Managed Care, Other (non HMO) | Admitting: *Deleted

## 2017-02-16 DIAGNOSIS — G459 Transient cerebral ischemic attack, unspecified: Secondary | ICD-10-CM | POA: Diagnosis not present

## 2017-02-17 NOTE — Progress Notes (Signed)
Carelink Summary Report / Loop Recorder 

## 2017-02-19 LAB — CUP PACEART REMOTE DEVICE CHECK
Date Time Interrogation Session: 20181001180651
Implantable Pulse Generator Implant Date: 20180703

## 2017-02-24 ENCOUNTER — Telehealth: Payer: Self-pay | Admitting: Gastroenterology

## 2017-02-24 NOTE — Telephone Encounter (Signed)
Spoke with pt via phone.  He voiced understanding.  Will hold Xarelto tonight and be here tomorrow for EGD. Angela/PV

## 2017-02-24 NOTE — Telephone Encounter (Signed)
Dr Havery Moros,   Please review.  Encounter on 02/16/17 refers to TIA.  On Xarelto.  Pt didn't stop yesterday.  EGD sch for tomorrow.  Please advise how to instruct pt from here.  Malaya Cagley/PV

## 2017-02-24 NOTE — Telephone Encounter (Signed)
Great! I will inform patient and tell him to make sure he holds the [place]! Than you

## 2017-02-24 NOTE — Telephone Encounter (Signed)
Patient called saying he forgot and accidentally took his xarelto last night at 5:00 pm. He was supposed to hold for 2 days. He is scheduled for an EGD tomorrow 02/25/17. Please advise

## 2017-02-24 NOTE — Telephone Encounter (Signed)
I think it is okay to proceed with EGD as scheduled. He should hold his Xarelto tonight. We should be able to safely take biopsies for possible Barrett's despite taking Xarelto last night. Thanks

## 2017-02-25 ENCOUNTER — Ambulatory Visit (AMBULATORY_SURGERY_CENTER): Payer: Managed Care, Other (non HMO) | Admitting: Gastroenterology

## 2017-02-25 ENCOUNTER — Encounter: Payer: Self-pay | Admitting: Gastroenterology

## 2017-02-25 VITALS — BP 127/75 | HR 59 | Temp 98.4°F | Resp 16 | Ht 69.0 in | Wt 176.6 lb

## 2017-02-25 DIAGNOSIS — K219 Gastro-esophageal reflux disease without esophagitis: Secondary | ICD-10-CM

## 2017-02-25 MED ORDER — SODIUM CHLORIDE 0.9 % IV SOLN: 500.0000 mL | INTRAVENOUS | Status: DC

## 2017-02-25 NOTE — Progress Notes (Signed)
Pt's states no medical or surgical changes since previsit or office visit. 

## 2017-02-25 NOTE — Op Note (Addendum)
[facility] Patient Name: Michael Lambert Procedure Date: 02/25/2017 8:30 AM MRN: [id number] Endoscopist: Remo Lipps P. Armbruster MD, MD Age: 61 Referring MD:  Date of Birth: August 02, 1955 Gender: Male Account #: [id number] Procedure:                Upper GI endoscopy Indications:              longstanding heartburn - controlled with PPI,                            screening for Barrett's Medicines:                Monitored Anesthesia Care Procedure:                Pre-Anesthesia Assessment:                           - Prior to the procedure, a History and Physical                            was performed, and patient medications and                            allergies were reviewed. The patient's tolerance of                            previous anesthesia was also reviewed. The risks                            and benefits of the procedure and the sedation                            options and risks were discussed with the patient.                            All questions were answered, and informed consent                            was obtained. Prior Anticoagulants: The patient has                            taken Xarelto (rivaroxaban), last dose was 2 days                            prior to procedure. ASA Grade Assessment: II - A                            patient with mild systemic disease. After reviewing                            the risks and benefits, the patient was deemed in                            satisfactory condition to undergo the procedure.  After obtaining informed consent, the endoscope was                            passed under direct vision. Throughout the                            procedure, the patient's blood pressure, pulse, and                            oxygen saturations were monitored continuously. The                            Endoscope was introduced through the mouth, and                            advanced to the  second part of duodenum. The upper                            GI endoscopy was accomplished without difficulty.                            The patient tolerated the procedure well. Scope In: Scope Out: Findings:                 Esophagogastric landmarks were identified: the                            Z-line was found at 37 cm, the gastroesophageal                            junction was found at 38 cm and the upper extent of                            the gastric folds was found at 40 cm from the                            incisors.                           A 2 cm hiatal hernia was present.                           The Z-line was irregular with a few tongues of                            salmon colored mucosa without nodularity - longest                            tongue was 8-48mm. Biopsies were taken with a cold                            forceps for histology.  A single area of ectopic gastric mucosa was found                            in the proximal esophagus.                           The exam of the esophagus was otherwise normal.                           The entire examined stomach was normal.                           The duodenal bulb and second portion of the                            duodenum were normal. Complications:            No immediate complications. Estimated blood loss:                            Minimal. Estimated Blood Loss:     Estimated blood loss was minimal. Impression:               - Esophagogastric landmarks identified.                           - 2 cm hiatal hernia.                           - Z-line irregular as outlined above. Biopsied.                           - Ectopic gastric mucosa in the proximal esophagus.                           - Normal stomach.                           - Normal duodenal bulb and second portion of the                            duodenum. Recommendation:           - Patient has a contact number  available for                            emergencies. The signs and symptoms of potential                            delayed complications were discussed with the                            patient. Return to normal activities tomorrow.                            Written discharge instructions were provided to the  patient.                           - Resume previous diet.                           - Continue present medications.                           - Resume Xarelto tomorrow morning                           - Await pathology results with further                            recommendations Remo Lipps P. Armbruster MD, MD 02/25/2017 8:48:43 AM This report has been signed electronically.

## 2017-02-25 NOTE — Progress Notes (Signed)
Called to room to assist during endoscopic procedure.  Patient ID and intended procedure confirmed with present staff. Received instructions for my participation in the procedure from the performing physician.  

## 2017-02-25 NOTE — Progress Notes (Signed)
Patient states that he does have a pituitary cyst that the Neurologist found incidentally back in June 2018 and wants healthcare providers to be aware.

## 2017-02-25 NOTE — Progress Notes (Signed)
Report to PACU, RN, vss, BBS= Clear.  

## 2017-02-25 NOTE — Patient Instructions (Signed)
**   We took biopsies today. Handout given on Hiatal Hernia. Resume Xarelto tomorrow.   YOU HAD AN ENDOSCOPIC PROCEDURE TODAY AT [facility]:   Refer to the procedure report that was given to you for any specific questions about what was found during the examination.  If the procedure report does not answer your questions, please call your gastroenterologist to clarify.  If you requested that your care partner not be given the details of your procedure findings, then the procedure report has been included in a sealed envelope for you to review at your convenience later.  YOU SHOULD EXPECT: Some feelings of bloating in the abdomen. Passage of more gas than usual.  Walking can help get rid of the air that was put into your GI tract during the procedure and reduce the bloating. If you had a lower endoscopy (such as a colonoscopy or flexible sigmoidoscopy) you may notice spotting of blood in your stool or on the toilet paper. If you underwent a bowel prep for your procedure, you may not have a normal bowel movement for a few days.  Please Note:  You might notice some irritation and congestion in your nose or some drainage.  This is from the oxygen used during your procedure.  There is no need for concern and it should clear up in a day or so.  SYMPTOMS TO REPORT IMMEDIATELY:   Following upper endoscopy (EGD)  Vomiting of blood or coffee ground material  New chest pain or pain under the shoulder blades  Painful or persistently difficult swallowing  New shortness of breath  Fever of 100F or higher  Black, tarry-looking stools  For urgent or emergent issues, a gastroenterologist can be reached at any hour by calling [phone].   DIET:  We do recommend a small meal at first, but then you may proceed to your regular diet.  Drink plenty of fluids but you should avoid alcoholic beverages for 24 hours.  ACTIVITY:  You should plan to take it easy for the rest of today and you should  NOT DRIVE or use heavy machinery until tomorrow (because of the sedation medicines used during the test).    FOLLOW UP: Our staff will call the number listed on your records the next business day following your procedure to check on you and address any questions or concerns that you may have regarding the information given to you following your procedure. If we do not reach you, we will leave a message.  However, if you are feeling well and you are not experiencing any problems, there is no need to return our call.  We will assume that you have returned to your regular daily activities without incident.  If any biopsies were taken you will be contacted by phone or by letter within the next 1-3 weeks.  Please call us at [phone] if you have not heard about the biopsies in 3 weeks.    SIGNATURES/CONFIDENTIALITY: You and/or your care partner have signed paperwork which will be entered into your electronic medical record.  These signatures attest to the fact that that the information above on your After Visit Summary has been reviewed and is understood.  Full responsibility of the confidentiality of this discharge information lies with you and/or your care-partner.

## 2017-02-26 ENCOUNTER — Telehealth: Payer: Self-pay

## 2017-02-26 NOTE — Telephone Encounter (Signed)
  Follow up Call-  Call back number 02/25/2017  Post procedure Call Back phone  # [phone]  Permission to leave phone message Yes  Some recent data might be hidden     Patient questions:  Do you have a fever, pain , or abdominal swelling? No. Pain Score  0 *  Have you tolerated food without any problems? Yes.    Have you been able to return to your normal activities? Yes.    Do you have any questions about your discharge instructions: Diet   No. Medications  No. Follow up visit  No.  Do you have questions or concerns about your Care? No.  Actions: * If pain score is 4 or above: No action needed, pain <4.

## 2017-03-03 ENCOUNTER — Encounter: Payer: Self-pay | Admitting: Gastroenterology

## 2017-03-05 ENCOUNTER — Ambulatory Visit: Payer: Managed Care, Other (non HMO) | Admitting: Nurse Practitioner

## 2017-03-09 NOTE — Progress Notes (Deleted)
[facility]  PATIENT: Michael Lambert DOB: 1956/01/19   REASON FOR VISIT:  Follow-up TIA versus  migraine HISTORY FROM:    HISTORY OF PRESENT ILLNESS:Michael Lambert is a 61 y.o. male here as a referral from Dr. Inda Merlin for TIA. Patient was seen in the emergency room for transient episode of blurred vision.  He is here with his wife. He is active and healthy. On memorial day weekend he felt "fuzzy". Also had some subtle loss of peripheral vision on the right, blurry not vision loss. Wife knew something was "off". He knew what he wanted to say but could not form the words. He spoke non-sensical language, he was taken to [facility] and had some slurred speech there. He couldn't tell them who the president was. He was improving. No code stroke was called per patient. He was seen quickly and went straight to cat scan and that was negative. He was not admitted for a workup. This has never happened to him before but he does have a hx of migraines at the age of 38. He has a nephew who had a TIA. He sees Dr. Orene Desanctis who has recommended TEE and loop. No other focal neurologic deficits, associated symptoms, inciting events or modifiable factors.  ***  REVIEW OF SYSTEMS: Full 14 system review of systems performed and notable only for those listed, all others are neg:  Constitutional: neg  Cardiovascular: neg Ear/Nose/Throat: neg  Skin: neg Eyes: neg Respiratory: neg Gastroitestinal: neg  Hematology/Lymphatic: neg  Endocrine: neg Musculoskeletal:neg Allergy/Immunology: neg Neurological: neg Psychiatric: neg Sleep : neg   ALLERGIES: No Known Allergies  HOME MEDICATIONS: Outpatient Medications Prior to Visit  Medication Sig Dispense Refill  . ALPRAZolam (XANAX) 0.5 MG tablet Take 1 tablet up to one hour prior to MRI for anxiety, may repeat x 1 if needed. (Patient not taking: Reported on 02/11/2017) 2 tablet 0  . atorvastatin (LIPITOR) 80 MG tablet Take 80 mg by mouth daily.      . cholecalciferol (VITAMIN D) 1000 units tablet Take 1,000 Units by mouth daily.    . metoprolol succinate (TOPROL-XL) 25 MG 24 hr tablet Take 0.5 tablets (12.5 mg total) by mouth daily. (Patient taking differently: Take 25 mg by mouth daily. ) 45 tablet 3  . Omega 3 1200 MG CAPS Take 2 capsules by mouth daily.    Marland Kitchen omeprazole (PRILOSEC) 20 MG capsule Take 20 mg by mouth daily.    . rivaroxaban (XARELTO) 20 MG TABS tablet Take 1 tablet (20 mg total) by mouth daily with supper. 90 tablet 3   No facility-administered medications prior to visit.     PAST MEDICAL HISTORY: Past Medical History:  Diagnosis Date  . Anxiety    sees Dr. Pearson Grippe   . Atrial fibrillation ([place])    on Xarelto daily  . Benign prostatic hypertrophy   . GERD (gastroesophageal reflux disease)   . Hyperlipidemia   . Migraine   . Stroke [facility])    TIA memorial day 2018    PAST SURGICAL HISTORY: Past Surgical History:  Procedure Laterality Date  . APPENDECTOMY    . colonoscopy  10-08-12   per Dr. Deatra Ina, adenomatous polyps, repeat in 5 yrs   . COLONOSCOPY    . CYSTOSCOPY     per Dr. Rosana Hoes-   . gum graft  08/2009   Dr Royston Cowper   . HERNIA REPAIR  2008   bilateral inguinal, per Dr. Zella Richer  . LOOP RECORDER INSERTION N/A 11/18/2016  Procedure: Loop Recorder Insertion;  Surgeon: Thompson Grayer, MD;  Location: [facility];  Service: Cardiovascular;  Laterality: N/A;  . PALATE / UVULA BIOPSY / EXCISION  2004   per Dr. Lucia Gaskins, for snoring   . RADIAL KERATOTOMY     sees Dr. Mercer Pod as FU  at [facility] , bilateral,  dr stonecipher did the RK   . repair detached retina    . TEE WITHOUT CARDIOVERSION N/A 11/18/2016   Procedure: TRANSESOPHAGEAL ECHOCARDIOGRAM (TEE);  Surgeon: Acie Fredrickson Wonda Cheng, MD;  Location: [facility];  Service: Cardiovascular;  Laterality: N/A;  . TONSILLECTOMY      FAMILY HISTORY: Family History  Problem Relation Age of Onset  . Multiple sclerosis Unknown        family hx   . Hyperlipidemia Father   . Ovarian cancer Mother   . Colon cancer Neg Hx   . Esophageal cancer Neg Hx   . Rectal cancer Neg Hx   . Stomach cancer Neg Hx   . Colon polyps Neg Hx     SOCIAL HISTORY: Social History   Social History  . Marital status: Married    Spouse name: N/A  . Number of children: N/A  . Years of education: N/A   Occupational History  . Not on file.   Social History Main Topics  . Smoking status: Former Smoker    Quit date: 05/19/1980  . Smokeless tobacco: Never Used  . Alcohol use 0.0 oz/week     Comment: 4 glasses of wine each day  . Drug use: No  . Sexual activity: Not on file   Other Topics Concern  . Not on file   Social History Narrative  . No narrative on file     PHYSICAL EXAM  There were no vitals filed for this visit. There is no height or weight on file to calculate BMI.  Generalized: Well developed, in no acute distress  Head: normocephalic and atraumatic,. Oropharynx benign  Neck: Supple, no carotid bruits  Cardiac: Regular rate rhythm, no murmur  Musculoskeletal: No deformity   Neurological examination   Mentation: Alert oriented to time, place, history taking. Attention span and concentration appropriate. Recent and remote memory intact.  Follows all commands speech and language fluent.   Cranial nerve II-XII: Fundoscopic exam reveals sharp disc margins.Pupils were equal round reactive to light extraocular movements were full, visual field were full on confrontational test. Facial sensation and strength were normal. hearing was intact to finger rubbing bilaterally. Uvula tongue midline. head turning and shoulder shrug were normal and symmetric.Tongue protrusion into cheek strength was normal. Motor: normal bulk and tone, full strength in the BUE, BLE, fine finger movements normal, no pronator drift. No focal weakness Sensory: normal and symmetric to light touch, pinprick, and  Vibration, proprioception  Coordination:  finger-nose-finger, heel-to-shin bilaterally, no dysmetria Reflexes: Brachioradialis 2/2, biceps 2/2, triceps 2/2, patellar 2/2, Achilles 2/2, plantar responses were flexor bilaterally. Gait and Station: Rising up from seated position without assistance, normal stance,  moderate stride, good arm swing, smooth turning, able to perform tiptoe, and heel walking without difficulty. Tandem gait is steady  DIAGNOSTIC DATA (LABS, IMAGING, TESTING) - I reviewed patient records, labs, notes, testing and imaging myself where available.  Lab Results  Component Value Date   WBC 7.1 10/13/2016   HGB 14.6 10/13/2016   HCT 43.0 10/13/2016   MCV 96.7 10/13/2016   PLT 185 10/13/2016      Component Value Date/Time   NA 137  10/13/2016 1735   K 3.6 10/13/2016 1735   CL 103 10/13/2016 1735   CO2 24 10/13/2016 1728   GLUCOSE 106 (H) 10/13/2016 1735   BUN 16 10/13/2016 1735   CREATININE 1.00 10/13/2016 1735   CALCIUM 9.7 10/13/2016 1728   PROT 7.5 10/13/2016 1728   ALBUMIN 4.9 10/13/2016 1728   AST 22 10/13/2016 1728   ALT 34 10/13/2016 1728   ALKPHOS 57 10/13/2016 1728   BILITOT 0.7 10/13/2016 1728   GFRNONAA >60 10/13/2016 1728   GFRAA >60 10/13/2016 1728   Lab Results  Component Value Date   CHOL 187 12/14/2015   HDL 49.50 12/14/2015   LDLCALC 106 (H) 12/14/2015   LDLDIRECT 143.7 08/20/2012   TRIG 161.0 (H) 12/14/2015   CHOLHDL 4 12/14/2015   Lab Results  Component Value Date   HGBA1C 5.0 06/06/2011   No results found for: [id number] Lab Results  Component Value Date   TSH 2.29 04/23/2016    ***  ASSESSMENT AND PLAN   This is a 61 year old patient with an episode of peripheral vision loss, aphasia, vision changes confusion. He has a past medical history of heart palpitations unclear diagnosis but her cardiology notes they recommend echocardiogram, TEE and loop and I tend to agree. MRI of the brain was negative except for a pituitary mass and so we'll reorder MRI with pituitary  protocol also need an MRA of the head to evaluate for any source of medium to large vessel emboli. He is going to see endocrinology who will likely test him for prolactin and other labs.  Patient has a history of migraines and this could be migrainous phenomena but feel that he also needs a full TIA/stroke workup. MRI w/ pituitary protocol, MRA head Needs TTE, TEE and possible loop due to possible aflutter/afib. See note from Dr. Orene Desanctis 10/17/2016: "He has had palpitations occasionally, proven using a commercial Kardia device to be frequent PACs. An implantable loop recorder would be greatly superior in detecting episodes of atrial fibrillation. If atrial fibrillation is detected he would meet criteria for full anticoagulation with a oral agent" He is seeing an endocrinologist Dr. Buddy Duty Will repeat Cholesterol, hgbA1c   I had a long d/w patient and wife about his recent event, risk for recurrent stroke/TIAs, personally independently reviewed imaging studies and stroke evaluation results and answered questions.Continue ASA 325mg  for secondary stroke prevention and maintain strict control of hypertension with blood pressure goal below 130/90, diabetes with hemoglobin A1c goal below 6.5% and lipids with LDL cholesterol goal below 70 mg/dL. I also advised the patient to eat a healthy diet with plenty of whole grains, cereals, fruits and vegetables, exercise regularly and maintain ideal body weight .Followup in the future with me in 4 months or call earlier if necessary. We'll contact cardiology to see how I should proceed with the workup considering he is Dr. Griffin Dakin patient.   Dennie Bible, [facility], [facility], APRN  [facility] [address], [place], [place] [address] [phone]

## 2017-03-10 ENCOUNTER — Ambulatory Visit: Payer: Managed Care, Other (non HMO) | Admitting: Nurse Practitioner

## 2017-03-10 ENCOUNTER — Telehealth: Payer: Self-pay

## 2017-03-10 NOTE — Telephone Encounter (Signed)
I called pt, no answer, left a message asking him to call me back. I was calling to advise him that his appt with Hoyle Sauer, NP this morning will need to be rescheduled because she is out of the office.

## 2017-03-10 NOTE — Telephone Encounter (Signed)
I called pt, spoke to pt's wife Shirlean Mylar, per DPR, advised her that pt's appt with Hoyle Sauer, NP this morning will need to be rescheduled. Pt's wife says that she will ask the pt call us back to reschedule.

## 2017-03-18 ENCOUNTER — Ambulatory Visit (INDEPENDENT_AMBULATORY_CARE_PROVIDER_SITE_OTHER): Payer: Managed Care, Other (non HMO) | Admitting: *Deleted

## 2017-03-18 DIAGNOSIS — G459 Transient cerebral ischemic attack, unspecified: Secondary | ICD-10-CM | POA: Diagnosis not present

## 2017-03-19 ENCOUNTER — Ambulatory Visit (INDEPENDENT_AMBULATORY_CARE_PROVIDER_SITE_OTHER): Payer: Managed Care, Other (non HMO) | Admitting: Cardiology

## 2017-03-19 ENCOUNTER — Encounter: Payer: Self-pay | Admitting: Cardiology

## 2017-03-19 VITALS — BP 110/70 | HR 58 | Ht 69.0 in | Wt 179.0 lb

## 2017-03-19 DIAGNOSIS — R002 Palpitations: Secondary | ICD-10-CM | POA: Diagnosis not present

## 2017-03-19 DIAGNOSIS — Z7901 Long term (current) use of anticoagulants: Secondary | ICD-10-CM | POA: Diagnosis not present

## 2017-03-19 DIAGNOSIS — I493 Ventricular premature depolarization: Secondary | ICD-10-CM

## 2017-03-19 DIAGNOSIS — I480 Paroxysmal atrial fibrillation: Secondary | ICD-10-CM | POA: Diagnosis not present

## 2017-03-19 DIAGNOSIS — E782 Mixed hyperlipidemia: Secondary | ICD-10-CM | POA: Diagnosis not present

## 2017-03-19 DIAGNOSIS — Z8673 Personal history of transient ischemic attack (TIA), and cerebral infarction without residual deficits: Secondary | ICD-10-CM | POA: Diagnosis not present

## 2017-03-19 NOTE — Assessment & Plan Note (Signed)
Symptomatic

## 2017-03-19 NOTE — Assessment & Plan Note (Signed)
TIA May 2018-loop recorder placed July 2018-transient speech disorder

## 2017-03-19 NOTE — Progress Notes (Signed)
03/19/2017 Michael Lambert   1955/09/09  [id number]  Primary Physician Josetta Huddle, MD Primary Cardiologist: Dr Sallyanne Kuster  HPI:  61 y/o male, followed by Dr Sallyanne Kuster for a history of palpitations treated with low dose Toprol, had a TIA in May 2018. A loop recorder was placed in July 2018 and documented PAF. Xarelto has been added and his Toprol increased to 25 mg daily. He is in the office today with some questions and concerns. He has a home cardio monitor device. He has noticed palpitations, usually at rest and also when he lays on his left side. He recorded this and he it shows PVCs. He also says he has noticed vague chest pressure intermittently. This is noted at rest, he denies any chest pain with exertion and he is active, swims, mountain bikes. He does admit to significant caffeine use.    Current Outpatient Prescriptions  Medication Sig Dispense Refill  . atorvastatin (LIPITOR) 80 MG tablet Take 80 mg by mouth daily.    . cholecalciferol (VITAMIN D) 1000 units tablet Take 1,000 Units by mouth daily.    . metoprolol succinate (TOPROL-XL) 25 MG 24 hr tablet Take 25 mg by mouth daily.    . Omega 3 1200 MG CAPS Take 2 capsules by mouth daily.    Marland Kitchen omeprazole (PRILOSEC) 20 MG capsule Take 20 mg by mouth daily.    . rivaroxaban (XARELTO) 20 MG TABS tablet Take 1 tablet (20 mg total) by mouth daily with supper. 90 tablet 3   No current facility-administered medications for this visit.     No Known Allergies  Past Medical History:  Diagnosis Date  . Anxiety    sees Dr. Pearson Grippe   . Atrial fibrillation ([place])    on Xarelto daily  . Benign prostatic hypertrophy   . GERD (gastroesophageal reflux disease)   . Hyperlipidemia   . Migraine   . Stroke [facility])    TIA memorial day 2018    Social History   Social History  . Marital status: Married    Spouse name: N/A  . Number of children: N/A  . Years of education: N/A   Occupational History  . Not on file.   Social History  Main Topics  . Smoking status: Former Smoker    Quit date: 05/19/1980  . Smokeless tobacco: Never Used  . Alcohol use 0.0 oz/week     Comment: 4 glasses of wine each day  . Drug use: No  . Sexual activity: Not on file   Other Topics Concern  . Not on file   Social History Narrative  . No narrative on file     Family History  Problem Relation Age of Onset  . Multiple sclerosis Unknown        family hx  . Hyperlipidemia Father   . Ovarian cancer Mother   . Colon cancer Neg Hx   . Esophageal cancer Neg Hx   . Rectal cancer Neg Hx   . Stomach cancer Neg Hx   . Colon polyps Neg Hx      Review of Systems: General: negative for chills, fever, night sweats or weight changes.  Cardiovascular: negative for chest pain, dyspnea on exertion, edema, orthopnea, palpitations, paroxysmal nocturnal dyspnea or shortness of breath Dermatological: negative for rash Respiratory: negative for cough or wheezing Urologic: negative for hematuria Abdominal: negative for nausea, vomiting, diarrhea, bright red blood per rectum, melena, or hematemesis Neurologic: negative for visual changes, syncope, or dizziness All other systems reviewed  and are otherwise negative except as noted above.    Blood pressure 110/70, pulse (!) 58, height 5\' 9"  (1.753 m), weight 179 lb (81.2 kg).  General appearance: alert, cooperative and no distress Neck: no carotid bruit and no JVD Lungs: clear to auscultation bilaterally Heart: regular rate and rhythm Extremities: extremities normal, atraumatic, no cyanosis or edema Skin: Skin color, texture, turgor normal. No rashes or lesions Neurologic: Grossly normal  EKG [id number]  ASSESSMENT AND PLAN:   Palpitations Pt has palpitations when he lays on his Lt side- PVCs recorded on his home cardio monitor  PAF (paroxysmal atrial fibrillation) (HCC) Documented on loop recorder  History of TIA (transient ischemic attack) TIA May 2018-loop recorder placed July  2018-transient speech disorder  Chronic anticoagulation CHADs VASc= 2- Xarelto added  Hyperlipidemia On statin Rx followed by PCP  PVC's (premature ventricular contractions) Symptomatic   PLAN  I discussed his palpitations and physiology of PVCs. He asked if he could stop the Toprol "since its not working". I suggested he connie the Toprol 25 mg and cut down on his caffeine intake instead.  We discussed his rest chest discomfort, I offered to do a treadmill, mainly for reassurance, but he would like to discuss that with Dr Sallyanne Kuster. He has a f/u in Jan.  Kerin Ransom PA-C 03/19/2017 8:31 AM

## 2017-03-19 NOTE — Patient Instructions (Signed)
Medication Instructions:  Continue current medications  If you need a refill on your cardiac medications before your next appointment, please call your pharmacy.  Labwork: None Ordered   Testing/Procedures: None Ordered  Follow-Up: Your physician wants you to follow-up in: Keep schedule appt with Dr Sallyanne Kuster..   Thank you for choosing [facility] at [facility]!!

## 2017-03-19 NOTE — Assessment & Plan Note (Signed)
On statin Rx- followed by PCP 

## 2017-03-19 NOTE — Assessment & Plan Note (Signed)
Documented on loop recorder

## 2017-03-19 NOTE — Assessment & Plan Note (Signed)
Pt has palpitations when he lays on his Lt side- PVCs recorded on his home cardio monitor

## 2017-03-19 NOTE — Assessment & Plan Note (Signed)
CHADs VASc= 2- Xarelto added

## 2017-03-20 LAB — CUP PACEART REMOTE DEVICE CHECK
Date Time Interrogation Session: 20181031180752
Implantable Pulse Generator Implant Date: 20180703

## 2017-03-20 NOTE — Progress Notes (Signed)
Carelink Summary Report / Loop Recorder 

## 2017-03-31 ENCOUNTER — Ambulatory Visit: Payer: Managed Care, Other (non HMO) | Admitting: Cardiovascular Disease

## 2017-04-17 ENCOUNTER — Ambulatory Visit (INDEPENDENT_AMBULATORY_CARE_PROVIDER_SITE_OTHER): Payer: Managed Care, Other (non HMO) | Admitting: *Deleted

## 2017-04-17 ENCOUNTER — Encounter: Payer: Self-pay | Admitting: Cardiovascular Disease

## 2017-04-17 DIAGNOSIS — G459 Transient cerebral ischemic attack, unspecified: Secondary | ICD-10-CM | POA: Diagnosis not present

## 2017-04-20 ENCOUNTER — Encounter: Payer: Self-pay | Admitting: Neurology

## 2017-04-20 MED ORDER — APIXABAN 5 MG PO TABS: 5.0000 mg | ORAL_TABLET | Freq: Two times a day (BID) | ORAL | 5 refills | Status: DC

## 2017-04-20 NOTE — Telephone Encounter (Signed)
Called pt to communicate instruction as well as get information on where he wanted Rx called in. Pt requested rx to local pharmacy. Agreed to start w 30 day supply, if he does well on this and no problems, aware we can call in a 90 day supply for him to local or mail order at his prefernce. He voiced thanks for phone call, as well as instructions I gave him for stopping xarelto and initiating eliquis. He will call back or send mychart message if any further needs.

## 2017-04-20 NOTE — Progress Notes (Signed)
Carelink Summary Report / Loop Recorder 

## 2017-04-28 LAB — CUP PACEART REMOTE DEVICE CHECK
Date Time Interrogation Session: 20181130183700
Implantable Pulse Generator Implant Date: 20180703

## 2017-05-13 NOTE — Progress Notes (Signed)
[facility]  PATIENT: Michael Lambert DOB: 11/01/1955   REASON FOR VISIT: Follow-up for TIA, abnormal MRI of the brain HISTORY FROM: Patient   HISTORY OF PRESENT ILLNESS:11/04/16 Michael Lambert is a 61 y.o. male here as a referral from Dr. Inda Lambert for TIA. Patient was seen in the emergency room for transient episode of blurred vision.  He is here with his wife. He is active and healthy. On memorial day weekend he felt "fuzzy". Also had some subtle loss of peripheral vision on the right, blurry not vision loss. Wife knew something was "off". He knew what he wanted to say but could not form the words. He spoke non-sensical language, he was taken to [facility] and had some slurred speech there. He couldn't tell them who the president was. He was improving. No code stroke was called per patient. He was seen quickly and went straight to cat scan and that was negative. He was not admitted for a workup. This has never happened to him before but he does have a hx of migraines at the age of 78. He has a nephew who had a TIA. He sees Michael Lambert who has recommended TEE and loop. No other focal neurologic deficits, associated symptoms, inciting events or modifiable factors. Patient was seen in the emergency room for transient episode of blurred vision followed by "jumbled up words". This started about 4:30 in 10/13/2016. When she was seen in the emergency room symptoms had resolved. No prior similar problems. He also described a fuzzy head with some blurriness in the right extreme peripheral visual field. No left eye symptoms. He walked into the living room where his wife was and she asked him what was wrong. He said 2 words that made no sense. She merely went to get her daughter and he was able to speak. He also has some word finding trouble. No stroke was seen on CT of the head.  MRI of the brain, personally reviewed images, showed no acute changes but did show a cystic lesion of the pituitary  gland which needs further workup. Ultrasound carotid duplex bilaterally did not show any significant stenosis of the carotid arteries.  TSH 04/2016 was normal. Cholesterol panel in July 2017 showed elevated triglycerides 161 and elevated LDL 106.  UPDATE 12/27/2018[name], 61 year old male returns for follow-up with history of TIA.  He was seen in the emergency room for transient loss of vision.  MRI negative for acute changes but did show a cystic lesion of the pituitary gland.  Loop recorder was placed and atrial fib episode was identified he is now on Eliquis 5 mg twice daily.  Patient is not aware of any palpitations, he has no bruising and no bleeding repeat lipids on 02/10/2017 shows LDL of 83 and total cholesterol 146 his Lipitor was increased to 80 mg.  He denies myalgias.  Hemoglobin A1c 5.3.  Blood pressure in the office today 124/75 , he is now on Zoloft for depression.  He remains very active, mountain bikes etc.  He has not had further stroke or TIA symptoms.  He needs repeat MRI with pituitary protocol to follow assist and if stable referral to neurosurgery REVIEW OF SYSTEMS: Full 14 system review of systems performed and notable only for those listed, all others are neg:  Constitutional: neg  Cardiovascular: neg Ear/Nose/Throat: neg  Skin: neg Eyes: neg Respiratory: neg Gastroitestinal: neg  Hematology/Lymphatic: neg  Endocrine: neg Musculoskeletal:neg Allergy/Immunology: neg Neurological: neg Psychiatric: neg Sleep : neg  ALLERGIES: No Known Allergies  HOME MEDICATIONS: Outpatient Medications Prior to Visit  Medication Sig Dispense Refill  . apixaban (ELIQUIS) 5 MG TABS tablet Take 1 tablet (5 mg total) by mouth 2 (two) times daily. 60 tablet 5  . atorvastatin (LIPITOR) 80 MG tablet Take 80 mg by mouth daily.    . cholecalciferol (VITAMIN D) 1000 units tablet Take 1,000 Units by mouth daily.    . metoprolol succinate (TOPROL-XL) 25 MG 24 hr tablet Take 25 mg by  mouth daily.    . Omega 3 1200 MG CAPS Take 2 capsules by mouth daily.    Marland Kitchen omeprazole (PRILOSEC) 20 MG capsule Take 20 mg by mouth daily.    . sertraline (ZOLOFT) 25 MG tablet Take 25 mg by mouth daily.     No facility-administered medications prior to visit.     PAST MEDICAL HISTORY: Past Medical History:  Diagnosis Date  . Anxiety    sees Dr. Pearson Lambert   . Atrial fibrillation ([place])    on Xarelto daily  . Benign prostatic hypertrophy   . GERD (gastroesophageal reflux disease)   . Hyperlipidemia   . Migraine   . Stroke [facility])    TIA memorial day 2018    PAST SURGICAL HISTORY: Past Surgical History:  Procedure Laterality Date  . APPENDECTOMY    . colonoscopy  10-08-12   per Dr. Deatra Lambert, adenomatous polyps, repeat in 5 yrs   . COLONOSCOPY    . CYSTOSCOPY     per Dr. Rosana Lambert-   . gum graft  08/2009   Dr Michael Lambert   . HERNIA REPAIR  2008   bilateral inguinal, per Dr. Zella Lambert  . LOOP RECORDER INSERTION N/A 11/18/2016   Procedure: Loop Recorder Insertion;  Surgeon: Michael Grayer, MD;  Location: [facility];  Service: Cardiovascular;  Laterality: N/A;  . PALATE / UVULA BIOPSY / EXCISION  2004   per Dr. Lucia Lambert, for snoring   . RADIAL KERATOTOMY     sees Michael Lambert as FU  at [facility] , bilateral,  dr Michael Lambert did the RK   . repair detached retina    . TEE WITHOUT CARDIOVERSION N/A 11/18/2016   Procedure: TRANSESOPHAGEAL ECHOCARDIOGRAM (TEE);  Surgeon: Michael Fredrickson Wonda Cheng, MD;  Location: [facility];  Service: Cardiovascular;  Laterality: N/A;  . TONSILLECTOMY      FAMILY HISTORY: Family History  Problem Relation Age of Onset  . Multiple sclerosis Unknown        family hx  . Hyperlipidemia Father   . Ovarian cancer Mother   . Colon cancer Neg Hx   . Esophageal cancer Neg Hx   . Rectal cancer Neg Hx   . Stomach cancer Neg Hx   . Colon polyps Neg Hx     SOCIAL HISTORY: Social History   Socioeconomic History  . Marital status: Married    Spouse  name: Not on file  . Number of children: Not on file  . Years of education: Not on file  . Highest education level: Not on file  Social Needs  . Financial resource strain: Not on file  . Food insecurity - worry: Not on file  . Food insecurity - inability: Not on file  . Transportation needs - medical: Not on file  . Transportation needs - non-medical: Not on file  Occupational History  . Not on file  Tobacco Use  . Smoking status: Former Smoker    Last attempt to quit: 05/19/1980    Years  since quitting: 37.0  . Smokeless tobacco: Never Used  Substance and Sexual Activity  . Alcohol use: Yes    Alcohol/week: 0.0 oz    Comment: 2 glasses of wine each day  . Drug use: No  . Sexual activity: Not on file  Other Topics Concern  . Not on file  Social History Narrative  . Not on file     PHYSICAL EXAM  Vitals:   05/14/17 0828  BP: 125/75  Pulse: 61  Weight: 179 lb 6.4 oz (81.4 kg)  Height: 5\' 9"  (1.753 m)   Body mass index is 26.49 kg/m.  Generalized: Well developed, in no acute distress  Head: normocephalic and atraumatic,. Oropharynx benign  Neck: Supple, no carotid bruits  Cardiac: Regular rate rhythm, no murmur  Musculoskeletal: No deformity   Neurological examination   Mentation: Alert oriented to time, place, history taking. Attention span and concentration appropriate. Recent and remote memory intact.  Follows all commands speech and language fluent.   Cranial nerve II-XII: Pupils were equal round reactive to light extraocular movements were full, visual field were full on confrontational test. Facial sensation and strength were normal. hearing was intact to finger rubbing bilaterally. Uvula tongue midline. head turning and shoulder shrug were normal and symmetric.Tongue protrusion into cheek strength was normal. Motor: normal bulk and tone, full strength in the BUE, BLE, fine finger movements normal, no pronator drift. No focal weakness Sensory: normal and  symmetric to light touch, pinprick, and  Vibration, in the upper and lower extremities Coordination: finger-nose-finger, heel-to-shin bilaterally, no dysmetria Reflexes: Brachioradialis 2/2, biceps 2/2, triceps 2/2, patellar 2/2, Achilles 2/2, plantar responses were flexor bilaterally. Gait and Station: Rising up from seated position without assistance, normal stance,  moderate stride, good arm swing, smooth turning, able to perform tiptoe, and heel walking without difficulty. Tandem gait is steady  DIAGNOSTIC DATA (LABS, IMAGING, TESTING) - I reviewed patient records, labs, notes, testing and imaging myself where available.  Lab Results  Component Value Date   WBC 7.1 10/13/2016   HGB 14.6 10/13/2016   HCT 43.0 10/13/2016   MCV 96.7 10/13/2016   PLT 185 10/13/2016      Component Value Date/Time   NA 137 10/13/2016 1735   K 3.6 10/13/2016 1735   CL 103 10/13/2016 1735   CO2 24 10/13/2016 1728   GLUCOSE 106 (H) 10/13/2016 1735   BUN 16 10/13/2016 1735   CREATININE 1.00 10/13/2016 1735   CALCIUM 9.7 10/13/2016 1728   PROT 7.5 10/13/2016 1728   ALBUMIN 4.9 10/13/2016 1728   AST 22 10/13/2016 1728   ALT 34 10/13/2016 1728   ALKPHOS 57 10/13/2016 1728   BILITOT 0.7 10/13/2016 1728   GFRNONAA >60 10/13/2016 1728   GFRAA >60 10/13/2016 1728   Lab Results  Component Value Date   CHOL 187 12/14/2015   HDL 49.50 12/14/2015   LDLCALC 106 (H) 12/14/2015   LDLDIRECT 143.7 08/20/2012   TRIG 161.0 (H) 12/14/2015   CHOLHDL 4 12/14/2015   Lab Results  Component Value Date   HGBA1C 5.0 06/06/2011   No results found for: [id number] Lab Results  Component Value Date   TSH 2.29 04/23/2016      ASSESSMENT AND PLAN  This is a 61 year old patient with an episode of peripheral vision loss, aphasia, vision changes confusion. He has a past medical history of heart palpitations unclear diagnosis but her cardiology notes they recommend echocardiogram, TEE and loop and I tend to agree.  MRI of the brain  was negative except for a pituitary mass and so we'll reorder MRI with pituitary protocol. MRA of the head  was negative.  Loop recorder with evidence of atrial fibrillation now on Eliquis.     PLAN: Stressed the importance of management of risk factors to prevent further stroke/TIA Continue Eliquis for secondary stroke prevention and atrial fibrillation Continue follow-up with cardiology Maintain strict control of hypertension with blood pressure goal below 130/90, today's reading124/75 continue antihypertensive medications Control of diabetes with hemoglobin A1c below 6.5 followed by primary care most recent hemoglobin A1c5.3 Cholesterol with LDL cholesterol less than 70, followed by primary care,  most recent 83 continue statin drugs Lipitor Exercise by walking, at least 30 min daily  eat healthy diet with whole grains,  fresh fruits and vegetables Will repeat MRI of the brain with and without pituitary protocol to follow pituitary cyst progression May need referral to neurosurgery Discharge from stroke clinic Dennie Bible, [facility], [facility], [facility] [address], [place], [place] [address] [phone]

## 2017-05-14 ENCOUNTER — Encounter: Payer: Self-pay | Admitting: Nurse Practitioner

## 2017-05-14 ENCOUNTER — Ambulatory Visit (INDEPENDENT_AMBULATORY_CARE_PROVIDER_SITE_OTHER): Payer: Managed Care, Other (non HMO) | Admitting: Nurse Practitioner

## 2017-05-14 VITALS — BP 125/75 | HR 61 | Ht 69.0 in | Wt 179.4 lb

## 2017-05-14 DIAGNOSIS — E782 Mixed hyperlipidemia: Secondary | ICD-10-CM

## 2017-05-14 DIAGNOSIS — Z8673 Personal history of transient ischemic attack (TIA), and cerebral infarction without residual deficits: Secondary | ICD-10-CM

## 2017-05-14 DIAGNOSIS — E236 Other disorders of pituitary gland: Secondary | ICD-10-CM | POA: Insufficient documentation

## 2017-05-14 DIAGNOSIS — I480 Paroxysmal atrial fibrillation: Secondary | ICD-10-CM

## 2017-05-14 DIAGNOSIS — R9089 Other abnormal findings on diagnostic imaging of central nervous system: Secondary | ICD-10-CM

## 2017-05-14 NOTE — Patient Instructions (Addendum)
Stressed the importance of management of risk factors to prevent further stroke/TIA Continue Eliquis for secondary stroke prevention and atrial fibrillation Maintain strict control of hypertension with blood pressure goal below 130/90, today's reading124/75 continue antihypertensive medications Control of diabetes with hemoglobin A1c below 6.5 followed by primary care most recent hemoglobin A1c5.3 Cholesterol with LDL cholesterol less than 70, followed by primary care,  most recent 83 continue statin drugs Lipitor Exercise by walking, at least 30 min daily  eat healthy diet with whole grains,  fresh fruits and vegetables Will repeat MRI of the brain with and without to follow pituitary cyst progression Discharge from stroke clinic   Stroke Prevention Some medical conditions and behaviors are associated with a higher chance of having a stroke. You can help prevent a stroke by making nutrition, lifestyle, and other changes, including managing any medical conditions you may have. What nutrition changes can be made?  Eat healthy foods. You can do this by: ? Choosing foods high in fiber, such as fresh fruits and vegetables and whole grains. ? Eating at least 5 or more servings of fruits and vegetables a day. Try to fill half of your plate at each meal with fruits and vegetables. ? Choosing lean protein foods, such as lean cuts of meat, poultry without skin, fish, tofu, beans, and nuts. ? Eating low-fat dairy products. ? Avoiding foods that are high in salt (sodium). This can help lower blood pressure. ? Avoiding foods that have saturated fat, trans fat, and cholesterol. This can help prevent high cholesterol. ? Avoiding processed and premade foods.  Follow your health care provider's specific guidelines for losing weight, controlling high blood pressure (hypertension), lowering high cholesterol, and managing diabetes. These may include: ? Reducing your daily calorie intake. ? Limiting your daily  sodium intake to 1,500 milligrams (mg). ? Using only healthy fats for cooking, such as olive oil, canola oil, or sunflower oil. ? Counting your daily carbohydrate intake. What lifestyle changes can be made?  Maintain a healthy weight. Talk to your health care provider about your ideal weight.  Get at least 30 minutes of moderate physical activity at least 5 days a week. Moderate activity includes brisk walking, biking, and swimming.  Do not use any products that contain nicotine or tobacco, such as cigarettes and e-cigarettes. If you need help quitting, ask your health care provider. It may also be helpful to avoid exposure to secondhand smoke.  Limit alcohol intake to no more than 1 drink a day for nonpregnant women and 2 drinks a day for men. One drink equals 12 oz of beer, 5 oz of wine, or 1 oz of hard liquor.  Stop any illegal drug use.  Avoid taking birth control pills. Talk to your health care provider about the risks of taking birth control pills if: ? You are over 45 years old. ? You smoke. ? You get migraines. ? You have ever had a blood clot. What other changes can be made?  Manage your cholesterol levels. ? Eating a healthy diet is important for preventing high cholesterol. If cholesterol cannot be managed through diet alone, you may also need to take medicines. ? Take any prescribed medicines to control your cholesterol as told by your health care provider.  Manage your diabetes. ? Eating a healthy diet and exercising regularly are important parts of managing your blood sugar. If your blood sugar cannot be managed through diet and exercise, you may need to take medicines. ? Take any prescribed medicines to  control your diabetes as told by your health care provider.  Control your hypertension. ? To reduce your risk of stroke, try to keep your blood pressure below 130/80. ? Eating a healthy diet and exercising regularly are an important part of controlling your blood  pressure. If your blood pressure cannot be managed through diet and exercise, you may need to take medicines. ? Take any prescribed medicines to control hypertension as told by your health care provider. ? Ask your health care provider if you should monitor your blood pressure at home. ? Have your blood pressure checked every year, even if your blood pressure is normal. Blood pressure increases with age and some medical conditions.  Get evaluated for sleep disorders (sleep apnea). Talk to your health care provider about getting a sleep evaluation if you snore a lot or have excessive sleepiness.  Take over-the-counter and prescription medicines only as told by your health care provider. Aspirin or blood thinners (antiplatelets or anticoagulants) may be recommended to reduce your risk of forming blood clots that can lead to stroke.  Make sure that any other medical conditions you have, such as atrial fibrillation or atherosclerosis, are managed. What are the warning signs of a stroke? The warning signs of a stroke can be easily remembered as BEFAST.  B is for balance. Signs include: ? Dizziness. ? Loss of balance or coordination. ? Sudden trouble walking.  E is for eyes. Signs include: ? A sudden change in vision. ? Trouble seeing.  F is for face. Signs include: ? Sudden weakness or numbness of the face. ? The face or eyelid drooping to one side.  A is for arms. Signs include: ? Sudden weakness or numbness of the arm, usually on one side of the body.  S is for speech. Signs include: ? Trouble speaking (aphasia). ? Trouble understanding.  T is for time. ? These symptoms may represent a serious problem that is an emergency. Do not wait to see if the symptoms will go away. Get medical help right away. Call your local emergency services (911 in the [place] Do not drive yourself to the hospital.  Other signs of stroke may include: ? A sudden, severe headache with no known  cause. ? Nausea or vomiting. ? Seizure.  Where to find more information: For more information, visit:  American Stroke Association: www.strokeassociation.org  National Stroke Association: www.stroke.org  Summary  You can prevent a stroke by eating healthy, exercising, not smoking, limiting alcohol intake, and managing any medical conditions you may have.  Do not use any products that contain nicotine or tobacco, such as cigarettes and e-cigarettes. If you need help quitting, ask your health care provider. It may also be helpful to avoid exposure to secondhand smoke.  Remember BEFAST for warning signs of stroke. Get help right away if you or a loved one has any of these signs. This information is not intended to replace advice given to you by your health care provider. Make sure you discuss any questions you have with your health care provider. Document Released: 06/12/2004 Document Revised: 06/10/2016 Document Reviewed: 06/10/2016 Elsevier Interactive Patient Education  [employer].

## 2017-05-14 NOTE — Progress Notes (Signed)
Personally  participated in, made any corrections needed, and agree with history, physical, neuro exam,assessment and plan as stated above.    Alassane Kalafut, MD [facility] 

## 2017-05-18 ENCOUNTER — Ambulatory Visit (INDEPENDENT_AMBULATORY_CARE_PROVIDER_SITE_OTHER): Payer: Managed Care, Other (non HMO) | Admitting: *Deleted

## 2017-05-18 DIAGNOSIS — G459 Transient cerebral ischemic attack, unspecified: Secondary | ICD-10-CM | POA: Diagnosis not present

## 2017-05-20 NOTE — Progress Notes (Signed)
Carelink Summary Report / Loop Recorder 

## 2017-05-28 LAB — CUP PACEART REMOTE DEVICE CHECK
Date Time Interrogation Session: 20181230190735
MDC IDC PG IMPLANT DT: 20180703

## 2017-05-29 ENCOUNTER — Encounter: Payer: Self-pay | Admitting: Nurse Practitioner

## 2017-05-29 ENCOUNTER — Other Ambulatory Visit: Payer: Self-pay | Admitting: Nurse Practitioner

## 2017-05-29 ENCOUNTER — Telehealth: Payer: Self-pay | Admitting: Nurse Practitioner

## 2017-05-29 MED ORDER — ALPRAZOLAM 0.5 MG PO TABS: ORAL_TABLET | ORAL | 0 refills | Status: DC

## 2017-05-29 NOTE — Telephone Encounter (Signed)
Please let patient know Xanax called in for MRI

## 2017-05-29 NOTE — Telephone Encounter (Signed)
Xanax Rx successfully faxed to [facility]. Called patient and informed.

## 2017-06-02 ENCOUNTER — Ambulatory Visit
Admission: RE | Admit: 2017-06-02 | Discharge: 2017-06-02 | Disposition: A | Discharge status: Home / Self Care | Payer: Managed Care, Other (non HMO) | Source: Ambulatory Visit | Attending: Nurse Practitioner | Admitting: Nurse Practitioner

## 2017-06-02 DIAGNOSIS — E236 Other disorders of pituitary gland: Secondary | ICD-10-CM

## 2017-06-02 DIAGNOSIS — R9089 Other abnormal findings on diagnostic imaging of central nervous system: Secondary | ICD-10-CM

## 2017-06-02 DIAGNOSIS — Z8673 Personal history of transient ischemic attack (TIA), and cerebral infarction without residual deficits: Secondary | ICD-10-CM

## 2017-06-02 DIAGNOSIS — H547 Unspecified visual loss: Secondary | ICD-10-CM | POA: Diagnosis not present

## 2017-06-02 MED ORDER — GADOBENATE DIMEGLUMINE 529 MG/ML IV SOLN
10.0000 mL | Freq: Once | INTRAVENOUS | Status: AC | PRN
  Administered 2017-06-02: 10 mL via INTRAVENOUS

## 2017-06-05 ENCOUNTER — Telehealth: Payer: Self-pay | Admitting: *Deleted

## 2017-06-05 ENCOUNTER — Encounter: Payer: Self-pay | Admitting: Neurology

## 2017-06-05 NOTE — Telephone Encounter (Addendum)
Called and spoke with patient. He is aware that his MRI brain is stable and there were no changes. He stated he saw the report online and he has further questions which he will include in a mychart email to Dr. Jaynee Eagles. I informed him we would be happy to answer his questions. He verbalized appreciation.    ----- Message from Melvenia Beam, MD sent at 06/05/2017  9:06 AM EST ----- MRI stable, no changes thanks

## 2017-06-16 ENCOUNTER — Ambulatory Visit (INDEPENDENT_AMBULATORY_CARE_PROVIDER_SITE_OTHER): Payer: Managed Care, Other (non HMO) | Admitting: *Deleted

## 2017-06-16 DIAGNOSIS — G459 Transient cerebral ischemic attack, unspecified: Secondary | ICD-10-CM

## 2017-06-17 NOTE — Progress Notes (Signed)
Carelink Summary Report / Loop Recorder 

## 2017-06-30 LAB — CUP PACEART REMOTE DEVICE CHECK
Implantable Pulse Generator Implant Date: 20180703
MDC IDC SESS DTM: 20190129193702

## 2017-07-20 ENCOUNTER — Ambulatory Visit (INDEPENDENT_AMBULATORY_CARE_PROVIDER_SITE_OTHER): Payer: Managed Care, Other (non HMO) | Admitting: *Deleted

## 2017-07-20 DIAGNOSIS — G459 Transient cerebral ischemic attack, unspecified: Secondary | ICD-10-CM | POA: Diagnosis not present

## 2017-07-20 NOTE — Progress Notes (Signed)
Carelink Summary Report / Loop Recorder 

## 2017-08-21 ENCOUNTER — Ambulatory Visit (INDEPENDENT_AMBULATORY_CARE_PROVIDER_SITE_OTHER): Payer: Managed Care, Other (non HMO) | Admitting: *Deleted

## 2017-08-21 DIAGNOSIS — G459 Transient cerebral ischemic attack, unspecified: Secondary | ICD-10-CM

## 2017-08-24 NOTE — Progress Notes (Signed)
Carelink Summary Report / Loop Recorder 

## 2017-09-01 LAB — CUP PACEART REMOTE DEVICE CHECK
Date Time Interrogation Session: 20190405203826
Date Time Interrogation Session: 20190303204025
Implantable Pulse Generator Implant Date: 20180703
MDC IDC PG IMPLANT DT: 20180703

## 2017-09-25 ENCOUNTER — Ambulatory Visit (INDEPENDENT_AMBULATORY_CARE_PROVIDER_SITE_OTHER): Payer: Managed Care, Other (non HMO) | Admitting: *Deleted

## 2017-09-25 DIAGNOSIS — G459 Transient cerebral ischemic attack, unspecified: Secondary | ICD-10-CM

## 2017-09-28 NOTE — Progress Notes (Signed)
Carelink Summary Report / Loop Recorder 

## 2017-10-01 ENCOUNTER — Encounter: Payer: Self-pay | Admitting: Gastroenterology

## 2017-10-19 ENCOUNTER — Other Ambulatory Visit: Payer: Self-pay | Admitting: Cardiovascular Disease

## 2017-10-20 LAB — CUP PACEART REMOTE DEVICE CHECK
Date Time Interrogation Session: 20190508213527
Implantable Pulse Generator Implant Date: 20180703

## 2017-10-20 NOTE — Telephone Encounter (Signed)
Rx sent to pharmacy   

## 2017-10-28 ENCOUNTER — Ambulatory Visit (INDEPENDENT_AMBULATORY_CARE_PROVIDER_SITE_OTHER): Payer: Managed Care, Other (non HMO) | Admitting: *Deleted

## 2017-10-28 DIAGNOSIS — G459 Transient cerebral ischemic attack, unspecified: Secondary | ICD-10-CM

## 2017-10-29 NOTE — Progress Notes (Signed)
Carelink Summary Report / Loop Recorder 

## 2017-12-01 ENCOUNTER — Ambulatory Visit (INDEPENDENT_AMBULATORY_CARE_PROVIDER_SITE_OTHER): Payer: Managed Care, Other (non HMO) | Admitting: *Deleted

## 2017-12-01 DIAGNOSIS — G459 Transient cerebral ischemic attack, unspecified: Secondary | ICD-10-CM

## 2017-12-01 NOTE — Progress Notes (Signed)
Carelink Summary Report / Loop Recorder 

## 2017-12-02 ENCOUNTER — Ambulatory Visit: Payer: Managed Care, Other (non HMO) | Admitting: Gastroenterology

## 2017-12-02 ENCOUNTER — Encounter: Payer: Self-pay | Admitting: Gastroenterology

## 2017-12-02 ENCOUNTER — Telehealth: Payer: Self-pay

## 2017-12-02 VITALS — BP 116/64 | HR 60 | Ht 69.0 in | Wt 173.0 lb

## 2017-12-02 DIAGNOSIS — K219 Gastro-esophageal reflux disease without esophagitis: Secondary | ICD-10-CM | POA: Diagnosis not present

## 2017-12-02 DIAGNOSIS — Z7901 Long term (current) use of anticoagulants: Secondary | ICD-10-CM | POA: Diagnosis not present

## 2017-12-02 DIAGNOSIS — Z86010 Personal history of colonic polyps: Secondary | ICD-10-CM

## 2017-12-02 MED ORDER — PEG-KCL-NACL-NASULF-NA ASC-C 140 G PO SOLR: 1.0000 | Freq: Once | ORAL | 0 refills | Status: AC

## 2017-12-02 NOTE — Telephone Encounter (Signed)
Called and spoke to pt.  Cleared to hold Eliquis for 2 days prior to procedure. He expressed understanding.

## 2017-12-02 NOTE — Telephone Encounter (Signed)
[facility] Pre-operative Risk Assessment     Request for surgical clearance:     Endoscopy Procedure  What type of surgery is being performed?     colonoscopy  When is this surgery scheduled?     02-10-18  What type of clearance is required ?   Pharmacy  Are there any medications that need to be held prior to surgery and how long? Eliquis 2 days  Practice name and name of physician performing surgery?      [facility]  What is your office phone and fax number?      Phone- [phone]  [phone]  Anesthesia type (None, local, MAC, general) ?       MAC

## 2017-12-02 NOTE — Progress Notes (Signed)
HPI :  62 year old male here  For a follow-up visit.  Since his last visit he had an upper endoscopy done in October 2018. He had an irregular Z line with a short tongue of salmon-colored mucosa, biopsies were negative for Barrett's however. His EGD in 2004 was also negative for Barrett's esophagus despite seeing short tongue of suspected Barrett's as well. He has use Nexium 20 mg daily for his symptoms which have been very well controlled. She does notice if he misses a dose he has significant symptoms of reflux however. He denies any dysphagia. He has about potential risks of chronic PPI therapy which we discussed.  He otherwise is due for surveillance colonoscopy at this time, he had a history of small adenoma removed about 5 years ago. He denies any family history of colon cancer although his mother and father died at a young age. He has a history of atrial fibrillation and TIA last year, currently on Eliquis for preventative measures.  Endoscopic history EGD done 09/09/2002 - 3cm hiatal hernia, short tongue of suspected Barrett's but biopsies negative EGD 02/25/2017 - 2cm HH, z-line irregular with 1cm tongues salmon colored mucosa, ectopic gastric mucosa of proximal esophagus, normal stomach and duodenum - bx show reflux changes only and no BE Colonoscopy 10/08/2012 - small cecal adenoma, otherwise normal   Past Medical History:  Diagnosis Date  . Anxiety    sees Dr. Pearson Grippe   . Atrial fibrillation ([place])    on Xarelto daily  . Benign prostatic hypertrophy   . GERD (gastroesophageal reflux disease)   . Hyperlipidemia   . Migraine   . Stroke [facility])    TIA memorial day 2018     Past Surgical History:  Procedure Laterality Date  . APPENDECTOMY    . colonoscopy  10-08-12   per Dr. Deatra Ina, adenomatous polyps, repeat in 5 yrs   . COLONOSCOPY    . CYSTOSCOPY     per Dr. Rosana Hoes-   . gum graft  08/2009   Dr Royston Cowper   . HERNIA REPAIR  2008   bilateral inguinal, per Dr.  Zella Richer  . LOOP RECORDER INSERTION N/A 11/18/2016   Procedure: Loop Recorder Insertion;  Surgeon: Thompson Grayer, MD;  Location: [place] [facility];  Service: Cardiovascular;  Laterality: N/A;  . PALATE / UVULA BIOPSY / EXCISION  2004   per Dr. Lucia Gaskins, for snoring   . RADIAL KERATOTOMY     sees Dr. Mercer Pod as FU  at [facility] , bilateral,  dr stonecipher did the RK   . repair detached retina    . TEE WITHOUT CARDIOVERSION N/A 11/18/2016   Procedure: TRANSESOPHAGEAL ECHOCARDIOGRAM (TEE);  Surgeon: Acie Fredrickson Wonda Cheng, MD;  Location: [facility] ENDOSCOPY;  Service: Cardiovascular;  Laterality: N/A;  . TONSILLECTOMY     Family History  Problem Relation Age of Onset  . Multiple sclerosis Unknown        family hx  . Hyperlipidemia Father   . Ovarian cancer Mother   . Colon cancer Neg Hx   . Esophageal cancer Neg Hx   . Rectal cancer Neg Hx   . Stomach cancer Neg Hx   . Colon polyps Neg Hx    Social History   Tobacco Use  . Smoking status: Former Smoker    Last attempt to quit: 05/19/1980    Years since quitting: 37.5  . Smokeless tobacco: Never Used  Substance Use Topics  . Alcohol use: Yes    Alcohol/week: 0.0 oz  Comment: 2 glasses of wine each day  . Drug use: No   Current Outpatient Medications  Medication Sig Dispense Refill  . apixaban (ELIQUIS) 5 MG TABS tablet Take 1 tablet (5 mg total) by mouth 2 (two) times daily. Please call to make appointment for further refills. 60 tablet 1  . atorvastatin (LIPITOR) 80 MG tablet Take 40 mg by mouth. Three times a week    . cholecalciferol (VITAMIN D) 1000 units tablet Take 1,000 Units by mouth daily.    Marland Kitchen esomeprazole (NEXIUM) 20 MG capsule Take 20 mg by mouth daily at 12 noon.    . latanoprost (XALATAN) 0.005 % ophthalmic solution Place 1 drop into both eyes at bedtime.    . metoprolol succinate (TOPROL-XL) 25 MG 24 hr tablet Take 25 mg by mouth daily.    . Omega 3 1200 MG CAPS Take 2 capsules by mouth daily.    . sertraline (ZOLOFT)  25 MG tablet Take 25 mg by mouth daily.    Marland Kitchen PEG-KCl-NaCl-NaSulf-Na Asc-C (PLENVU) 140 g SOLR Take 1 kit by mouth once for 1 dose. 1 each 0   No current facility-administered medications for this visit.    No Known Allergies   Review of Systems: All systems reviewed and negative except where noted in HPI.   Lab Results  Component Value Date   WBC 7.1 10/13/2016   HGB 14.6 10/13/2016   HCT 43.0 10/13/2016   MCV 96.7 10/13/2016   PLT 185 10/13/2016    Lab Results  Component Value Date   CREATININE 1.00 10/13/2016   BUN 16 10/13/2016   NA 137 10/13/2016   K 3.6 10/13/2016   CL 103 10/13/2016   CO2 24 10/13/2016    Lab Results  Component Value Date   ALT 34 10/13/2016   AST 22 10/13/2016   ALKPHOS 57 10/13/2016   BILITOT 0.7 10/13/2016     Physical Exam: BP 116/64   Pulse 60   Ht 5' 9"  (1.753 m)   Wt 173 lb (78.5 kg)   BMI 25.55 kg/m  Constitutional: Pleasant,well-developed, male in no acute distress. HEENT: Normocephalic and atraumatic. Conjunctivae are normal. No scleral icterus. Neck supple.  Cardiovascular: Normal rate, regular rhythm.  Pulmonary/chest: Effort normal and breath sounds normal. No wheezing, rales or rhonchi. Abdominal: Soft, nondistended, nontender.  There are no masses palpable. No hepatomegaly. Extremities: no edema Lymphadenopathy: No cervical adenopathy noted. Neurological: Alert and oriented to person place and time. Skin: Skin is warm and dry. No rashes noted. Psychiatric: Normal mood and affect. Behavior is normal.   ASSESSMENT AND PLAN: 62 y/o male here to discuss the following issues:  GERD - irregular Z line noted on recent EGD however biopsies show no evidence of Barrett's esophagus, similar to his last exam in 2004. We discussed long-term management. He endorses quick recurrence of symptoms when he stops PPI. He will continue to use lowest-dose of PPI needed to control symptoms. Currently Nexium 20 mg once daily is working quite  well. I discussed the long-term risks and benefits of chronic PPI use with him, he verbalized understanding and wants to continue with it at this time.  History of colon polyps / anticoagulated - history of small adenoma removed 5 years ago, due for surveillance colonoscopy at this time. I discussed risks and benefits of colonoscopy and anesthesia with him. He will need to hold Eliquis for 2 days prior to the colonoscopy given the bleeding risks. We'll reach out to his cardiologist to ensure this  is okay to do that. While he holds Eliquis he can take aspirin for preventative purposes. He agreed.  [place] Cellar, MD [facility]

## 2017-12-02 NOTE — Telephone Encounter (Signed)
Patient with diagnosis of Afib on Eliquis for anticoagulation.    Procedure: colonoscopy Date of procedure: 02/10/18  CHADS2-VASc score of  2 (CHF, HTN, AGE, DM2, stroke/tia x 2, CAD, AGE, male)  CrCl 79ml/min  Per office protocol, patient can hold Eliquis for 24 hours prior to procedure.  With history of TIA would recommend resume Eliquis as soon as safe post procedure

## 2017-12-02 NOTE — Patient Instructions (Addendum)
If you are age 64 or older, your body mass index should be between 23-30. Your Body mass index is 25.55 kg/m. If this is out of the aforementioned range listed, please consider follow up with your Primary Care Provider.  If you are age 49 or younger, your body mass index should be between 19-25. Your Body mass index is 25.55 kg/m. If this is out of the aformentioned range listed, please consider follow up with your Primary Care Provider.   You have been scheduled for a colonoscopy. Please follow written instructions given to you at your visit today.  Please pick up your prep supplies at the pharmacy within the next 1-3 days. If you use inhalers (even only as needed), please bring them with you on the day of your procedure. Your physician has requested that you go to www.startemmi.com and enter the access code given to you at your visit today. This web site gives a general overview about your procedure. However, you should still follow specific instructions given to you by our office regarding your preparation for the procedure.  You will be contacted by our office prior to your procedure for directions on holding your Eliquis for 2 days prior to your procedure.  If you do not hear from our office 1 week prior to your scheduled procedure, please call [phone] to discuss.   Continue taking Nexium 20 mg once daily.  Thank you for entrusting me with your care and for choosing [facility], Dr. [place] Cellar

## 2017-12-04 LAB — CUP PACEART REMOTE DEVICE CHECK
Date Time Interrogation Session: 20190610214044
MDC IDC PG IMPLANT DT: 20180703

## 2017-12-10 ENCOUNTER — Encounter: Payer: Self-pay | Admitting: Cardiology

## 2017-12-18 ENCOUNTER — Other Ambulatory Visit: Payer: Self-pay | Admitting: Cardiovascular Disease

## 2017-12-22 ENCOUNTER — Other Ambulatory Visit: Payer: Self-pay | Admitting: Cardiovascular Disease

## 2017-12-22 MED ORDER — APIXABAN 5 MG PO TABS: 5.0000 mg | ORAL_TABLET | Freq: Two times a day (BID) | ORAL | 0 refills | Status: DC

## 2017-12-22 NOTE — Telephone Encounter (Signed)
New Message:        *STAT* If patient is at the pharmacy, call can be transferred to refill team.   1. Which medications need to be refilled? (please list name of each medication and dose if known) ELIQUIS 5 MG TABS tablet  metoprolol succinate (TOPROL-XL) 25 MG 24 hr tablet  2. Which pharmacy/location (including street and city if local pharmacy) is medication to be sent to?[place], [place] [address]  3. Do they need a 30 day or 90 day supply? [place]

## 2017-12-29 ENCOUNTER — Telehealth: Payer: Self-pay | Admitting: Cardiology

## 2017-12-29 NOTE — Telephone Encounter (Signed)
LMOVM requesting that pt send manual transmission b/c home monitor has not updated in at least 14 days.    

## 2017-12-30 NOTE — Telephone Encounter (Signed)
Attempted to confirm remote transmission with pt. No answer and was unable to leave a message.   

## 2018-01-01 ENCOUNTER — Ambulatory Visit (INDEPENDENT_AMBULATORY_CARE_PROVIDER_SITE_OTHER): Payer: Managed Care, Other (non HMO) | Admitting: *Deleted

## 2018-01-01 DIAGNOSIS — I480 Paroxysmal atrial fibrillation: Secondary | ICD-10-CM | POA: Diagnosis not present

## 2018-01-04 NOTE — Progress Notes (Signed)
Carelink Summary Report / Loop Recorder 

## 2018-01-19 LAB — CUP PACEART REMOTE DEVICE CHECK
Implantable Pulse Generator Implant Date: 20180703
MDC IDC SESS DTM: 20190713220604

## 2018-02-01 ENCOUNTER — Other Ambulatory Visit: Payer: Self-pay | Admitting: Cardiovascular Disease

## 2018-02-04 ENCOUNTER — Ambulatory Visit (INDEPENDENT_AMBULATORY_CARE_PROVIDER_SITE_OTHER): Payer: Managed Care, Other (non HMO) | Admitting: *Deleted

## 2018-02-04 DIAGNOSIS — I639 Cerebral infarction, unspecified: Secondary | ICD-10-CM | POA: Diagnosis not present

## 2018-02-04 NOTE — Progress Notes (Signed)
Carelink Summary Report / Loop Recorder 

## 2018-02-05 ENCOUNTER — Encounter: Payer: Self-pay | Admitting: Gastroenterology

## 2018-02-08 LAB — CUP PACEART REMOTE DEVICE CHECK
Implantable Pulse Generator Implant Date: 20180703
MDC IDC SESS DTM: 20190815220908

## 2018-02-10 ENCOUNTER — Encounter: Payer: Managed Care, Other (non HMO) | Admitting: Gastroenterology

## 2018-02-12 ENCOUNTER — Encounter: Payer: Self-pay | Admitting: Cardiovascular Disease

## 2018-02-12 ENCOUNTER — Ambulatory Visit: Payer: Managed Care, Other (non HMO) | Admitting: Cardiovascular Disease

## 2018-02-12 VITALS — BP 106/68 | HR 55 | Ht 69.0 in | Wt 171.0 lb

## 2018-02-12 DIAGNOSIS — Z789 Other specified health status: Secondary | ICD-10-CM

## 2018-02-12 DIAGNOSIS — I517 Cardiomegaly: Secondary | ICD-10-CM | POA: Diagnosis not present

## 2018-02-12 DIAGNOSIS — Z7289 Other problems related to lifestyle: Secondary | ICD-10-CM

## 2018-02-12 DIAGNOSIS — I10 Essential (primary) hypertension: Secondary | ICD-10-CM | POA: Diagnosis not present

## 2018-02-12 DIAGNOSIS — E7800 Pure hypercholesterolemia, unspecified: Secondary | ICD-10-CM

## 2018-02-12 DIAGNOSIS — Z7901 Long term (current) use of anticoagulants: Secondary | ICD-10-CM

## 2018-02-12 DIAGNOSIS — I480 Paroxysmal atrial fibrillation: Secondary | ICD-10-CM | POA: Diagnosis not present

## 2018-02-12 DIAGNOSIS — F1090 Alcohol use, unspecified, uncomplicated: Secondary | ICD-10-CM

## 2018-02-12 NOTE — Patient Instructions (Signed)
Dr Croitoru recommends that you schedule a follow-up appointment in 12 months. You will receive a reminder letter in the mail two months in advance. If you don't receive a letter, please call our office to schedule the follow-up appointment.  If you need a refill on your cardiac medications before your next appointment, please call your pharmacy. 

## 2018-02-12 NOTE — Progress Notes (Signed)
Cardiology Consultation Note    Date:  02/12/2018   ID:  Michael Lambert, DOB 03/24/1956, MRN [id number]  PCP:  Josetta Huddle, MD  Cardiologist:   Sanda Klein, MD   Chief Complaint  Patient presents with  . Atrial Fibrillation    History of Present Illness:  Michael Lambert is a 62 y.o. male with paroxysmal atrial fibrillation and history of TIA, here for follow-up.  Michael Lambert feels well.  He has very infrequent palpitations, usually when he is lying on his left side trying to fall asleep.  He does not experience palpitations during the daytime.  We initially diagnosed the palpitations as representing isolated PACs using a phone app.  He later experienced what seems to be a transient ischemic attack in May 2018 and an implantable loop recorder showed that he does have brief episodes of atrial fibrillation.  He started anticoagulation with Eliquis which is tolerating well without bleeding problems.  He does not have any known structural heart disease, but does have mild left atrial dilation (end-systolic diameter of 41 mm) and a very small PFO.  His loop recorder has continued to show brief episodes of paroxysmal atrial tachycardia and paroxysmal atrial fibrillation, generally lasting for just 2 or 3 minutes, once every 3 months or so.  He continues to be very physically active and is quite fit.  He has restricted his alcohol intake to no more than 2 drinks at night.  Recent labs showed favorable findings.  His LDL cholesterol was 104 on maximum dose atorvastatin.  He has gastroesophageal reflux disease that is well controlled with omeprazole.  He does not have a family history of cardiac disease. His brother also has high cholesterol but has not had coronary other vascular problems by the age of 2. Unfortunately both Michael Lambert's parents died relatively young from ovarian cancer and multiple sclerosis respectively.  Past Medical History:  Diagnosis Date  . Anxiety    sees Dr. Pearson Grippe   . Atrial  fibrillation ([place])    on Xarelto daily  . Benign prostatic hypertrophy   . GERD (gastroesophageal reflux disease)   . Hyperlipidemia   . Migraine   . Stroke [facility])    TIA memorial day 2018    Past Surgical History:  Procedure Laterality Date  . APPENDECTOMY    . colonoscopy  10-08-12   per Dr. Deatra Ina, adenomatous polyps, repeat in 5 yrs   . COLONOSCOPY    . CYSTOSCOPY     per Dr. Rosana Hoes-   . gum graft  08/2009   Dr Royston Cowper   . HERNIA REPAIR  2008   bilateral inguinal, per Dr. Zella Richer  . LOOP RECORDER INSERTION N/A 11/18/2016   Procedure: Loop Recorder Insertion;  Surgeon: Thompson Grayer, MD;  Location: [place] [facility];  Service: Cardiovascular;  Laterality: N/A;  . PALATE / UVULA BIOPSY / EXCISION  2004   per Dr. Lucia Gaskins, for snoring   . RADIAL KERATOTOMY     sees Dr. Mercer Pod as FU  at [facility] , bilateral,  dr stonecipher did the RK   . repair detached retina    . TEE WITHOUT CARDIOVERSION N/A 11/18/2016   Procedure: TRANSESOPHAGEAL ECHOCARDIOGRAM (TEE);  Surgeon: Acie Fredrickson Wonda Cheng, MD;  Location: [facility] ENDOSCOPY;  Service: Cardiovascular;  Laterality: N/A;  . TONSILLECTOMY      Current Medications: Outpatient Medications Prior to Visit  Medication Sig Dispense Refill  . atorvastatin (LIPITOR) 80 MG tablet Take 40 mg by mouth. Three times a week    .  cholecalciferol (VITAMIN D) 1000 units tablet Take 1,000 Units by mouth daily.    Marland Kitchen ELIQUIS 5 MG TABS tablet TAKE ONE TABLET BY MOUTH TWICE A DAY 60 tablet 0  . esomeprazole (NEXIUM) 20 MG capsule Take 20 mg by mouth daily at 12 noon.    . latanoprost (XALATAN) 0.005 % ophthalmic solution Place 1 drop into both eyes at bedtime.    . metoprolol succinate (TOPROL-XL) 25 MG 24 hr tablet Take 25 mg by mouth daily.    . Omega 3 1200 MG CAPS Take 2 capsules by mouth daily.    . sertraline (ZOLOFT) 25 MG tablet Take 25 mg by mouth daily.     No facility-administered medications prior to visit.      Allergies:   Patient has  no known allergies.   Social History   Socioeconomic History  . Marital status: Married    Spouse name: Not on file  . Number of children: Not on file  . Years of education: Not on file  . Highest education level: Not on file  Occupational History  . Not on file  Social Needs  . Financial resource strain: Not on file  . Food insecurity:    Worry: Not on file    Inability: Not on file  . Transportation needs:    Medical: Not on file    Non-medical: Not on file  Tobacco Use  . Smoking status: Former Smoker    Last attempt to quit: 05/19/1980    Years since quitting: 37.7  . Smokeless tobacco: Never Used  Substance and Sexual Activity  . Alcohol use: Yes    Alcohol/week: 0.0 standard drinks    Comment: 2 glasses of wine each day  . Drug use: No  . Sexual activity: Not on file  Lifestyle  . Physical activity:    Days per week: Not on file    Minutes per session: Not on file  . Stress: Not on file  Relationships  . Social connections:    Talks on phone: Not on file    Gets together: Not on file    Attends religious service: Not on file    Active member of club or organization: Not on file    Attends meetings of clubs or organizations: Not on file    Relationship status: Not on file  Other Topics Concern  . Not on file  Social History Narrative  . Not on file     Family History:  The patient's family history includes Hyperlipidemia in his father; Multiple sclerosis in his unknown relative; Ovarian cancer in his mother.   ROS:   Please see the history of present illness.    ROS all other systems are reviewed and are negative  PHYSICAL EXAM:   VS:  BP 106/68   Pulse (!) 55   Ht 5\' 9"  (1.753 m)   Wt 171 lb (77.6 kg)   BMI 25.25 kg/m     General: Alert, oriented x3, no distress, lean and fit Head: no evidence of trauma, PERRL, EOMI, no exophtalmos or lid lag, no myxedema, no xanthelasma; normal ears, nose and oropharynx Neck: normal jugular venous pulsations and  no hepatojugular reflux; brisk carotid pulses without delay and no carotid bruits Chest: clear to auscultation, no signs of consolidation by percussion or palpation, normal fremitus, symmetrical and full respiratory excursions Cardiovascular: normal position and quality of the apical impulse, regular rhythm, normal first and second heart sounds, no murmurs, rubs or gallops Abdomen: no tenderness  or distention, no masses by palpation, no abnormal pulsatility or arterial bruits, normal bowel sounds, no hepatosplenomegaly Extremities: no clubbing, cyanosis or edema; 2+ radial, ulnar and brachial pulses bilaterally; 2+ right femoral, posterior tibial and dorsalis pedis pulses; 2+ left femoral, posterior tibial and dorsalis pedis pulses; no subclavian or femoral bruits Neurological: grossly nonfocal Psych: Normal mood and affect   Wt Readings from Last 3 Encounters:  02/12/18 171 lb (77.6 kg)  12/02/17 173 lb (78.5 kg)  05/14/17 179 lb 6.4 oz (81.4 kg)      Studies/Labs Reviewed:   EKG:  EKG is ordered today.  The ekg ordered today demonstrates mild sinus bradycardia, QTC 420 ms, no significant repolarization changes on today's tracing  LABS: November 03, 2017 Normal liver function test, hemoglobin 15.2, normal thyroid tests Total cholesterol 166, HDL 42, LDL 104, triglycerides 104  Lipid Panel    Component Value Date/Time   CHOL 187 12/14/2015 1205   TRIG 161.0 (H) 12/14/2015 1205   HDL 49.50 12/14/2015 1205   CHOLHDL 4 12/14/2015 1205   VLDL 32.2 12/14/2015 1205   LDLCALC 106 (H) 12/14/2015 1205   LDLDIRECT 143.7 08/20/2012 0955     ASSESSMENT:    1. PAF (paroxysmal atrial fibrillation) ([place])   2. Long term current use of anticoagulant   3. Essential hypertension   4. Left atrial dilation   5. Alcohol use   6. Hypercholesterolemia      PLAN:  In order of problems listed above:  1. PAFib: Despite the fact that his episodes of atrial fibrillation have always been very  brief, he does have a history of TIA and should continue anticoagulation. CHADSVasc 2-3 (TIA , questionable hypertension). Nevertheless, he has a very low risk for complications if he temporarily interrupts his anticoagulation for medical procedures, such as his upcoming colonoscopy.  I told himto follow Dr. Doyne Keel advice as to when it is safe to restart the anticoagulant, depending on the extent of any possible polypectomy procedures.  While I think Michael Lambert would be an excellent candidate for radiofrequency ablation if he becomes more symptomatic, at this point his clinical status is excellent and it makes little sense to refer him for ablation. 2. Eliquis: No bleeding complications, compliant.  Reminded him to always wear a helmet if he rides his bicycle. 3. HTN: Michael Lambert had borderline elevation in both his systolic and diastolic blood pressure, but after starting a beta blocker his blood pressure is great. He does not have any side effects from the metoprolol.  I think is providing satisfactory palliation for his symptoms. 4. LA dilation on echo: This may be related to hypertension, although there was no evidence of systolic or diastolic left ventricular dysfunction on echo. He might have an atrial myopathy. 5. Alcohol use: Reviewed the relationship between alcohol and atrial fibrillation. 6. HLP: LDL is close to target, but not perfect.  I do not think it is worth adding more medication in the absence of vascular problems.  His triglycerides are lower, probably because of his reduction in alcohol.    Medication Adjustments/Labs and Tests Ordered: Current medicines are reviewed at length with the patient today.  Concerns regarding medicines are outlined above.  Medication changes, Labs and Tests ordered today are listed in the Patient Instructions below. Patient Instructions  Dr Sallyanne Kuster recommends that you schedule a follow-up appointment in 12 months. You will receive a reminder letter in the mail  two months in advance. If you don't receive a letter, please call our office  to schedule the follow-up appointment.  If you need a refill on your cardiac medications before your next appointment, please call your pharmacy.    Signed, Sanda Klein, MD  02/12/2018 2:28 PM    [address], [place], [place]  [address] Phone: [phone]; Fax: [phone]

## 2018-02-15 LAB — CUP PACEART REMOTE DEVICE CHECK
Date Time Interrogation Session: 20190917223803
MDC IDC PG IMPLANT DT: 20180703

## 2018-03-05 ENCOUNTER — Ambulatory Visit (AMBULATORY_SURGERY_CENTER): Payer: Managed Care, Other (non HMO) | Admitting: Gastroenterology

## 2018-03-05 ENCOUNTER — Encounter: Payer: Self-pay | Admitting: Gastroenterology

## 2018-03-05 VITALS — BP 113/59 | HR 67 | Temp 98.4°F | Resp 16 | Ht 69.0 in | Wt 171.0 lb

## 2018-03-05 DIAGNOSIS — Z86010 Personal history of colonic polyps: Secondary | ICD-10-CM

## 2018-03-05 DIAGNOSIS — D123 Benign neoplasm of transverse colon: Secondary | ICD-10-CM

## 2018-03-05 DIAGNOSIS — K635 Polyp of colon: Secondary | ICD-10-CM

## 2018-03-05 DIAGNOSIS — D125 Benign neoplasm of sigmoid colon: Secondary | ICD-10-CM

## 2018-03-05 MED ORDER — SODIUM CHLORIDE 0.9 % IV SOLN: 500.0000 mL | Freq: Once | INTRAVENOUS | Status: DC

## 2018-03-05 NOTE — Op Note (Signed)
[place] Patient Name: Michael Lambert Procedure Date: 03/05/2018 9:10 AM MRN: [id number] Endoscopist: Remo Lipps P. Havery Moros , MD Age: 62 Referring MD:  Date of Birth: 1956-03-19 Gender: Male Account #: [id number] Procedure:                Colonoscopy Indications:              Surveillance: Personal history of adenomatous                            polyps on last colonoscopy 5 years ago Medicines:                Monitored Anesthesia Care Procedure:                Pre-Anesthesia Assessment:                           - Prior to the procedure, a History and Physical                            was performed, and patient medications and                            allergies were reviewed. The patient's tolerance of                            previous anesthesia was also reviewed. The risks                            and benefits of the procedure and the sedation                            options and risks were discussed with the patient.                            All questions were answered, and informed consent                            was obtained. Prior Anticoagulants: The patient has                            taken no previous anticoagulant or antiplatelet                            agents. ASA Grade Assessment: II - A patient with                            mild systemic disease. After reviewing the risks                            and benefits, the patient was deemed in                            satisfactory condition to undergo the procedure.  After obtaining informed consent, the colonoscope                            was passed under direct vision. Throughout the                            procedure, the patient's blood pressure, pulse, and                            oxygen saturations were monitored continuously. The                            Model CF-HQ190L [id number]) scope was introduced                            through the anus and  advanced to the the cecum,                            identified by appendiceal orifice and ileocecal                            valve. The colonoscopy was performed without                            difficulty. The patient tolerated the procedure                            well. The quality of the bowel preparation was                            good. The ileocecal valve, appendiceal orifice, and                            rectum were photographed. Scope In: 9:13:03 AM Scope Out: 9:36:01 AM Scope Withdrawal Time: 0 hours 19 minutes 48 seconds  Total Procedure Duration: 0 hours 22 minutes 58 seconds  Findings:                 The perianal and digital rectal examinations were                            normal.                           A 3 mm polyp was found in the transverse colon. The                            polyp was flat. The polyp was removed with a cold                            biopsy forceps. Resection and retrieval were                            complete.  A 3 mm polyp was found in the sigmoid colon. The                            polyp was sessile. The polyp was removed with a                            cold biopsy forceps. Resection and retrieval were                            complete.                           Internal hemorrhoids were found during retroflexion.                           The exam was otherwise without abnormality. Complications:            No immediate complications. Estimated blood loss:                            Minimal. Estimated Blood Loss:     Estimated blood loss was minimal. Impression:               - One 3 mm polyp in the transverse colon, removed                            with a cold biopsy forceps. Resected and retrieved.                           - One 3 mm polyp in the sigmoid colon, removed with                            a cold biopsy forceps. Resected and retrieved.                           - Internal  hemorrhoids.                           - The examination was otherwise normal. Recommendation:           - Patient has a contact number available for                            emergencies. The signs and symptoms of potential                            delayed complications were discussed with the                            patient. Return to normal activities tomorrow.                            Written discharge instructions were provided to the  patient.                           - Resume previous diet.                           - Continue present medications.                           - Resume Eliquis today                           - Await pathology results.                           - Repeat colonoscopy for surveillance based on                            pathology results. Remo Lipps P. Lauranne Beyersdorf, MD 03/05/2018 9:40:10 AM This report has been signed electronically.

## 2018-03-05 NOTE — Patient Instructions (Signed)
Handouts given on polyps and hemorrhoids. Resume Eliquis today.    YOU HAD AN ENDOSCOPIC PROCEDURE TODAY AT [facility]:   Refer to the procedure report that was given to you for any specific questions about what was found during the examination.  If the procedure report does not answer your questions, please call your gastroenterologist to clarify.  If you requested that your care partner not be given the details of your procedure findings, then the procedure report has been included in a sealed envelope for you to review at your convenience later.  YOU SHOULD EXPECT: Some feelings of bloating in the abdomen. Passage of more gas than usual.  Walking can help get rid of the air that was put into your GI tract during the procedure and reduce the bloating. If you had a lower endoscopy (such as a colonoscopy or flexible sigmoidoscopy) you may notice spotting of blood in your stool or on the toilet paper. If you underwent a bowel prep for your procedure, you may not have a normal bowel movement for a few days.  Please Note:  You might notice some irritation and congestion in your nose or some drainage.  This is from the oxygen used during your procedure.  There is no need for concern and it should clear up in a day or so.  SYMPTOMS TO REPORT IMMEDIATELY:   Following lower endoscopy (colonoscopy or flexible sigmoidoscopy):  Excessive amounts of blood in the stool  Significant tenderness or worsening of abdominal pains  Swelling of the abdomen that is new, acute  Fever of 100F or higher   For urgent or emergent issues, a gastroenterologist can be reached at any hour by calling [phone].   DIET:  We do recommend a small meal at first, but then you may proceed to your regular diet.  Drink plenty of fluids but you should avoid alcoholic beverages for 24 hours.  ACTIVITY:  You should plan to take it easy for the rest of today and you should NOT DRIVE or use heavy machinery  until tomorrow (because of the sedation medicines used during the test).    FOLLOW UP: Our staff will call the number listed on your records the next business day following your procedure to check on you and address any questions or concerns that you may have regarding the information given to you following your procedure. If we do not reach you, we will leave a message.  However, if you are feeling well and you are not experiencing any problems, there is no need to return our call.  We will assume that you have returned to your regular daily activities without incident.  If any biopsies were taken you will be contacted by phone or by letter within the next 1-3 weeks.  Please call us at [phone] if you have not heard about the biopsies in 3 weeks.    SIGNATURES/CONFIDENTIALITY: You and/or your care partner have signed paperwork which will be entered into your electronic medical record.  These signatures attest to the fact that that the information above on your After Visit Summary has been reviewed and is understood.  Full responsibility of the confidentiality of this discharge information lies with you and/or your care-partner.

## 2018-03-05 NOTE — Progress Notes (Signed)
Called to room to assist during endoscopic procedure.  Patient ID and intended procedure confirmed with present staff. Received instructions for my participation in the procedure from the performing physician.  

## 2018-03-05 NOTE — Progress Notes (Signed)
A/ox3 pleased with MAC, report to RN 

## 2018-03-07 ENCOUNTER — Other Ambulatory Visit: Payer: Self-pay | Admitting: Cardiovascular Disease

## 2018-03-08 ENCOUNTER — Ambulatory Visit (INDEPENDENT_AMBULATORY_CARE_PROVIDER_SITE_OTHER): Payer: Managed Care, Other (non HMO) | Admitting: *Deleted

## 2018-03-08 ENCOUNTER — Telehealth: Payer: Self-pay

## 2018-03-08 DIAGNOSIS — I480 Paroxysmal atrial fibrillation: Secondary | ICD-10-CM

## 2018-03-08 NOTE — Telephone Encounter (Signed)
  Follow up Call-  Call back number 03/05/2018 02/25/2017  Post procedure Call Back phone  # [phone]  Permission to leave phone message Yes Yes  Some recent data might be hidden     Patient questions:  Do you have a fever, pain , or abdominal swelling? No. Pain Score  0 *  Have you tolerated food without any problems? Yes.    Have you been able to return to your normal activities? Yes.    Do you have any questions about your discharge instructions: Diet   No. Medications  No. Follow up visit  No.  Do you have questions or concerns about your Care? No.  Actions: * If pain score is 4 or above: No action needed, pain <4.

## 2018-03-08 NOTE — Progress Notes (Signed)
Carelink Summary Report / Loop Recorder 

## 2018-03-26 LAB — CUP PACEART REMOTE DEVICE CHECK
Date Time Interrogation Session: 20191020233940
Implantable Pulse Generator Implant Date: 20180703

## 2018-04-09 ENCOUNTER — Ambulatory Visit (INDEPENDENT_AMBULATORY_CARE_PROVIDER_SITE_OTHER): Payer: Managed Care, Other (non HMO)

## 2018-04-09 ENCOUNTER — Telehealth: Payer: Self-pay

## 2018-04-09 DIAGNOSIS — I639 Cerebral infarction, unspecified: Secondary | ICD-10-CM

## 2018-04-09 NOTE — Telephone Encounter (Signed)
LMOVM requesting that pt send manual transmission b/c home monitor has not updated in at least 14 days.    

## 2018-04-12 NOTE — Progress Notes (Signed)
Carelink Summary Report / Loop Recorder 

## 2018-04-28 ENCOUNTER — Encounter: Payer: Self-pay | Admitting: Cardiology

## 2018-05-13 ENCOUNTER — Ambulatory Visit (INDEPENDENT_AMBULATORY_CARE_PROVIDER_SITE_OTHER): Payer: Managed Care, Other (non HMO)

## 2018-05-13 DIAGNOSIS — I639 Cerebral infarction, unspecified: Secondary | ICD-10-CM | POA: Diagnosis not present

## 2018-05-13 LAB — CUP PACEART REMOTE DEVICE CHECK
Date Time Interrogation Session: 20191226011014
MDC IDC PG IMPLANT DT: 20180703

## 2018-05-13 NOTE — Progress Notes (Signed)
Carelink Summary Report / Loop Recorder 

## 2018-05-30 LAB — CUP PACEART REMOTE DEVICE CHECK
Date Time Interrogation Session: 20191122233546
Implantable Pulse Generator Implant Date: 20180703

## 2018-06-14 ENCOUNTER — Ambulatory Visit (INDEPENDENT_AMBULATORY_CARE_PROVIDER_SITE_OTHER): Payer: Managed Care, Other (non HMO)

## 2018-06-14 DIAGNOSIS — I639 Cerebral infarction, unspecified: Secondary | ICD-10-CM

## 2018-06-15 LAB — CUP PACEART REMOTE DEVICE CHECK
Date Time Interrogation Session: 20200128010954
MDC IDC PG IMPLANT DT: 20180703

## 2018-06-15 NOTE — Progress Notes (Signed)
Carelink Summary Report / Loop Recorder 

## 2018-07-02 ENCOUNTER — Telehealth: Payer: Self-pay

## 2018-07-02 NOTE — Telephone Encounter (Signed)
Left message for patient regarding disconnected monitor.  

## 2018-07-18 LAB — CUP PACEART REMOTE DEVICE CHECK
Date Time Interrogation Session: 20200301010647
Implantable Pulse Generator Implant Date: 20180703

## 2018-07-19 ENCOUNTER — Ambulatory Visit (INDEPENDENT_AMBULATORY_CARE_PROVIDER_SITE_OTHER): Payer: Managed Care, Other (non HMO) | Admitting: *Deleted

## 2018-07-19 DIAGNOSIS — R002 Palpitations: Secondary | ICD-10-CM

## 2018-07-19 DIAGNOSIS — I639 Cerebral infarction, unspecified: Secondary | ICD-10-CM | POA: Diagnosis not present

## 2018-07-27 NOTE — Progress Notes (Signed)
Carelink Summary Report / Loop Recorder 

## 2018-08-19 ENCOUNTER — Ambulatory Visit (INDEPENDENT_AMBULATORY_CARE_PROVIDER_SITE_OTHER): Payer: Managed Care, Other (non HMO) | Admitting: *Deleted

## 2018-08-19 ENCOUNTER — Other Ambulatory Visit: Payer: Self-pay

## 2018-08-19 DIAGNOSIS — I639 Cerebral infarction, unspecified: Secondary | ICD-10-CM

## 2018-08-20 LAB — CUP PACEART REMOTE DEVICE CHECK
Date Time Interrogation Session: 20200403014047
Implantable Pulse Generator Implant Date: 20180703

## 2018-08-27 NOTE — Progress Notes (Signed)
Carelink Summary Report / Loop Recorder 

## 2018-09-21 ENCOUNTER — Other Ambulatory Visit: Payer: Self-pay

## 2018-09-21 ENCOUNTER — Ambulatory Visit (INDEPENDENT_AMBULATORY_CARE_PROVIDER_SITE_OTHER): Payer: Managed Care, Other (non HMO) | Admitting: *Deleted

## 2018-09-21 DIAGNOSIS — I639 Cerebral infarction, unspecified: Secondary | ICD-10-CM

## 2018-09-22 LAB — CUP PACEART REMOTE DEVICE CHECK
Date Time Interrogation Session: 20200506013804
Implantable Pulse Generator Implant Date: 20180703

## 2018-09-28 NOTE — Progress Notes (Signed)
Carelink Summary Report / Loop Recorder 

## 2018-10-25 LAB — CUP PACEART REMOTE DEVICE CHECK
Date Time Interrogation Session: 20200608014218
Implantable Pulse Generator Implant Date: 20180703

## 2018-10-26 ENCOUNTER — Ambulatory Visit (INDEPENDENT_AMBULATORY_CARE_PROVIDER_SITE_OTHER): Payer: Managed Care, Other (non HMO) | Admitting: *Deleted

## 2018-10-26 DIAGNOSIS — I639 Cerebral infarction, unspecified: Secondary | ICD-10-CM | POA: Diagnosis not present

## 2018-11-04 NOTE — Progress Notes (Signed)
Carelink Summary Report / Loop Recorder 

## 2018-11-27 LAB — CUP PACEART REMOTE DEVICE CHECK
Date Time Interrogation Session: 20200711020916
Implantable Pulse Generator Implant Date: 20180703

## 2018-11-29 ENCOUNTER — Ambulatory Visit (INDEPENDENT_AMBULATORY_CARE_PROVIDER_SITE_OTHER): Payer: Managed Care, Other (non HMO) | Admitting: *Deleted

## 2018-11-29 DIAGNOSIS — I480 Paroxysmal atrial fibrillation: Secondary | ICD-10-CM | POA: Diagnosis not present

## 2018-12-01 ENCOUNTER — Encounter: Payer: Self-pay | Admitting: Cardiovascular Disease

## 2018-12-01 ENCOUNTER — Telehealth: Payer: Self-pay | Admitting: *Deleted

## 2018-12-01 ENCOUNTER — Telehealth (INDEPENDENT_AMBULATORY_CARE_PROVIDER_SITE_OTHER): Payer: Managed Care, Other (non HMO) | Admitting: Cardiovascular Disease

## 2018-12-01 VITALS — BP 134/89 | HR 60 | Ht 69.0 in | Wt 174.0 lb

## 2018-12-01 DIAGNOSIS — Z7901 Long term (current) use of anticoagulants: Secondary | ICD-10-CM | POA: Diagnosis not present

## 2018-12-01 DIAGNOSIS — R079 Chest pain, unspecified: Secondary | ICD-10-CM

## 2018-12-01 DIAGNOSIS — I480 Paroxysmal atrial fibrillation: Secondary | ICD-10-CM

## 2018-12-01 DIAGNOSIS — Z8673 Personal history of transient ischemic attack (TIA), and cerebral infarction without residual deficits: Secondary | ICD-10-CM

## 2018-12-01 DIAGNOSIS — E782 Mixed hyperlipidemia: Secondary | ICD-10-CM | POA: Diagnosis not present

## 2018-12-01 NOTE — Telephone Encounter (Signed)
The patient has been called and made aware that the cardiac ct has been ordered. Once it has been approved then someone will call to get it scheduled for him. He has also been informed that a BMET will need to be done prior to the test. Instructions have been mailed.

## 2018-12-01 NOTE — Patient Instructions (Signed)
Medication Instructions:  Your physician recommends that you continue on your current medications as directed. Please refer to the Current Medication list given to you today.  If you need a refill on your cardiac medications before your next appointment, please call your pharmacy.   Lab work: Your provider would like for you to return in one week prior to the cardiac ct to have the following labs drawn: BMET. You do not need an appointment for the lab. Once in our office lobby there is a podium where you can sign in and ring the doorbell to alert [place] that you are here. The lab is open from 8:00 am to 4:30 pm; closed for lunch from 12:45pm-1:45pm.   Testing/Procedures: Your physician has requested that you have cardiac CT. Cardiac computed tomography (CT) is a painless test that uses an x-ray machine to take clear, detailed pictures of your heart. For further information please visit HugeFiesta.[email or web address]. Please follow instruction sheet as given.  Follow-Up: At [facility], you and your health needs are our priority.  As part of our continuing mission to provide you with exceptional heart care, we have created designated Provider Care Teams.  These Care Teams include your primary Cardiologist (physician) and Advanced Practice Providers (APPs -  Physician Assistants and Nurse Practitioners) who all work together to provide you with the care you need, when you need it. You will need a follow up appointment in 3 months.  Please call our office 2 months in advance to schedule this appointment.  You may see  Sanda Klein, MD or one of the following Advanced Practice Providers on your designated Care Team: Almyra Deforest, [place] Fabian Sharp, [place]  Any Other Special Instructions Will Be Listed Below (If Applicable).  Please arrive at the [facility] main entrance of [facility] at xx:xx AM (30-45 minutes prior to test start time)  [facility] [place], [place]  [id number] [phone]  Proceed to the [facility] Radiology Department (First Floor).  Please follow these instructions carefully (unless otherwise directed):  On the Night Before the Test: . Be sure to Drink plenty of water. . Do not consume any caffeinated/decaffeinated beverages or chocolate 12 hours prior to your test. . Do not take any antihistamines 12 hours prior to your test.   On the Day of the Test: . Drink plenty of water. Do not drink any water within one hour of the test. . Do not eat any food 4 hours prior to the test. . You may take your regular medications prior to the test.  . Take 50 mg of your metoprolol succinate two hours prior to test. Just on this day only.  .  After the Test: . Drink plenty of water. . After receiving IV contrast, you may experience a mild flushed feeling. This is normal. . On occasion, you may experience a mild rash up to 24 hours after the test. This is not dangerous. If this occurs, you can take Benadryl 25 mg and increase your fluid intake. . If you experience trouble breathing, this can be serious. If it is severe call 911 IMMEDIATELY. If it is mild, please call our office.

## 2018-12-01 NOTE — Progress Notes (Signed)
Virtual Visit via Video Note   This visit type was conducted due to national recommendations for restrictions regarding the COVID-19 Pandemic (e.g. social distancing) in an effort to limit this patient's exposure and mitigate transmission in our community.  Due to his co-morbid illnesses, this patient is at least at moderate risk for complications without adequate follow up.  This format is felt to be most appropriate for this patient at this time.  All issues noted in this document were discussed and addressed.  A limited physical exam was performed with this format.  Please refer to the patient's chart for his consent to telehealth for [facility].   Date:  12/01/2018   ID:  Michael Lambert, DOB 1955/09/21, MRN [id number]  Patient Location: Home Provider Location: Office  PCP:  Michael Huddle, MD  Cardiologist:  Michael Lambert Electrophysiologist:  None   Evaluation Performed:  Follow-Up Visit  Chief Complaint:  Chest pain  History of Present Illness:    Michael Lambert is a 63 y.o. male with infrequent paroxysmal atrial fibrillation and history of TIA July 2018 leading to ILR implantation, hyperlipidemia with recent reduction in exercise tolerance.  [name] is very fit.  He rides his mountain bike at least twice a week and swims laps.   He became "super-winded" and felt chest tightness riding his usual route on his mountain bike over the last week and had a very slow recovery and dizziness after finishing exercise.  All those days the weather was very hot, but he is accustomed to riding in the middle of the day in the summer.  His first Michael Lambert tracing after exercise on that last ride shows AFib with mild RVR. The subsequent tracings show sinus rhythm.  Previous echo showed normal left ventricular systolic function and wall mo mild left atrial dilation (end-systolic diameter of 41 mm) and a very small PFO. As per loop recorder, the AFib episodes are brief, up to maximum 64minutes duration, overall  burden 0.1%. Episodes are often, but not always nocturnal.  He does have a history of GERD, on PPI.  The patient does not have symptoms concerning for COVID-19 infection (fever, chills, cough, or new shortness of breath).    Past Medical History:  Diagnosis Date   Anxiety    sees Dr. Pearson Lambert    Atrial fibrillation [facility])    on Xarelto daily   Benign prostatic hypertrophy    GERD (gastroesophageal reflux disease)    Hyperlipidemia    Migraine    Stroke [facility])    TIA memorial day 2018   Past Surgical History:  Procedure Laterality Date   APPENDECTOMY     colonoscopy  10-08-12   per Dr. Deatra Ina, adenomatous polyps, repeat in 5 yrs    COLONOSCOPY     CYSTOSCOPY     per Dr. Rosana Hoes-    gum graft  08/2009   Dr Royston Cowper    HERNIA REPAIR  2008   bilateral inguinal, per Dr. Zella Richer   LOOP RECORDER INSERTION N/A 11/18/2016   Procedure: Loop Recorder Insertion;  Surgeon: Thompson Grayer, MD;  Location: [facility];  Service: Cardiovascular;  Laterality: N/A;   PALATE / UVULA BIOPSY / EXCISION  2004   per Dr. Lucia Gaskins, for snoring    RADIAL KERATOTOMY     sees Dr. Mercer Pod as FU  at [facility] , bilateral,  dr stonecipher did the RK    repair detached retina     TEE WITHOUT CARDIOVERSION N/A 11/18/2016   Procedure:  TRANSESOPHAGEAL ECHOCARDIOGRAM (TEE);  Surgeon: Acie Fredrickson Wonda Cheng, MD;  Location: [facility] ENDOSCOPY;  Service: Cardiovascular;  Laterality: N/A;   TONSILLECTOMY       Current Meds  Medication Sig   atorvastatin (LIPITOR) 80 MG tablet Take 40 mg by mouth. Three times a week   cholecalciferol (VITAMIN D) 1000 units tablet Take 1,000 Units by mouth daily.   ELIQUIS 5 MG TABS tablet TAKE ONE TABLET BY MOUTH TWICE A DAY   esomeprazole (NEXIUM) 20 MG capsule Take 20 mg by mouth daily at 12 noon.   latanoprost (XALATAN) 0.005 % ophthalmic solution Place 1 drop into both eyes at bedtime.   metoprolol succinate (TOPROL-XL) 25 MG 24 hr tablet Take  25 mg by mouth daily.   Omega 3 1200 MG CAPS Take 1 capsule by mouth daily.    sertraline (ZOLOFT) 25 MG tablet Take 25 mg by mouth daily.     Allergies:   Patient has no known allergies.   Social History   Tobacco Use   Smoking status: Former Smoker    Quit date: 05/19/1980    Years since quitting: 38.5   Smokeless tobacco: Never Used  Substance Use Topics   Alcohol use: Yes    Alcohol/week: 0.0 standard drinks    Comment: 2 glasses of wine each day   Drug use: No     Family Hx: The patient's family history includes Hyperlipidemia in his father; Multiple sclerosis in his unknown relative; Ovarian cancer in his mother. There is no history of Colon cancer, Esophageal cancer, Rectal cancer, Stomach cancer, or Colon polyps.  ROS:   Please see the history of present illness.     All other systems reviewed and are negative.   Prior CV studies:   The following studies were reviewed today: Multiple loop recorder downloads Transesophageal echo July 2018 and October 2017   Labs/Other Tests and Data Reviewed:    EKG:  The patient's [facility] cardiac telemetry strip(s) personally reviewed today demonstrate:  Atrial fibrillation with RVR November 29 2018  ECG 02/12/2018 shows mild sinus bradycardia, otherwise normal tracing.  Recent Labs: 09/22/2018 creatinine 0.94, potassium 4.3, hemoglobin 15.3, normal liver function tests, normal TSH  Recent Lipid Panel Lab Results  Component Value Date/Time   CHOL 187 12/14/2015 12:05 PM   TRIG 161.0 (H) 12/14/2015 12:05 PM   HDL 49.50 12/14/2015 12:05 PM   CHOLHDL 4 12/14/2015 12:05 PM   LDLCALC 106 (H) 12/14/2015 12:05 PM   LDLDIRECT 143.7 08/20/2012 09:55 AM   09/22/2018 total cholesterol 172, HDL 45, LDL 106, triglycerides 107  Wt Readings from Last 3 Encounters:  12/01/18 174 lb (78.9 kg)  03/05/18 171 lb (77.6 kg)  02/12/18 171 lb (77.6 kg)     Objective:    Vital Signs:  BP 134/89    Pulse 60    Ht 5\' 9"  (1.753 m)     Wt 174 lb (78.9 kg)    BMI 25.70 kg/m    VITAL SIGNS:  reviewed GEN:  no acute distress EYES:  sclerae anicteric, EOMI - Extraocular Movements Intact RESPIRATORY:  normal respiratory effort, symmetric expansion CARDIOVASCULAR:  no peripheral edema SKIN:  no rash, lesions or ulcers. MUSCULOSKELETAL:  no obvious deformities. NEURO:  alert and oriented x 3, no obvious focal deficit PSYCH:  normal affect  ASSESSMENT & PLAN:    1. Exertional chest tightness: Concerning for newly developed coronary insufficiency, although it probably occurred in the setting of atrial fibrillation with RVR.  Risk factors include  male gender, age approaching 59, hypercholesterolemia.  We will schedule for coronary CT angiography.  2. Parox AFib: It is possible that this is responsible for shortness of breath and angina during intense physical activity, but it would be surprising for it to occur during 3 consecutive exercise sessions, especially since his burden of A. fib has been quite low.  Will be able to see if there is any correlation on his next loop recorder download.  During sinus rhythm his heart rate is in the low 50s and I do not think there is any room to increase his beta-blocker at this point.  If he does not have coronary artery disease and needs antiarrhythmics, consider flecainide.  If coronary artery disease is identified, consider dofetilide. 3. HLP: He takes atorvastatin only 3 or 4 times weekly due to concerns about myalgia.  If identified CAD will have to aggressively treat LDL cholesterol to less than 70.  Consider switching to rosuvastatin with addition of co-Q10. 4. Anticoagulation: Compliant, no bleeding complications.  COVID-19 Education: The signs and symptoms of COVID-19 were discussed with the patient and how to seek care for testing (follow up with PCP or arrange E-visit).  The importance of social distancing was discussed today.  Time:   Today, I have spent 21 minutes with the patient  with telehealth technology discussing the above problems.     Medication Adjustments/Labs and Tests Ordered: Current medicines are reviewed at length with the patient today.  Concerns regarding medicines are outlined above.   Tests Ordered: No orders of the defined types were placed in this encounter.   Medication Changes: No orders of the defined types were placed in this encounter.  Patient Instructions  Medication Instructions:  Your physician recommends that you continue on your current medications as directed. Please refer to the Current Medication list given to you today.  If you need a refill on your cardiac medications before your next appointment, please call your pharmacy.   Lab work: Your provider would like for you to return in one week prior to the cardiac ct to have the following labs drawn: BMET. You do not need an appointment for the lab. Once in our office lobby there is a podium where you can sign in and ring the doorbell to alert [place] that you are here. The lab is open from 8:00 am to 4:30 pm; closed for lunch from 12:45pm-1:45pm.   Testing/Procedures: Your physician has requested that you have cardiac CT. Cardiac computed tomography (CT) is a painless test that uses an x-ray machine to take clear, detailed pictures of your heart. For further information please visit HugeFiesta.[email or web address]. Please follow instruction sheet as given.  Follow-Up: At [facility], you and your health needs are our priority.  As part of our continuing mission to provide you with exceptional heart care, we have created designated Provider Care Teams.  These Care Teams include your primary Cardiologist (physician) and Advanced Practice Providers (APPs -  Physician Assistants and Nurse Practitioners) who all work together to provide you with the care you need, when you need it. You will need a follow up appointment in 3 months.  Please call our office 2 months in advance to schedule this  appointment.  You may see  Sanda Klein, MD or one of the following Advanced Practice Providers on your designated Care Team: Almyra Deforest, [place] Fabian Sharp, [place]  Any Other Special Instructions Will Be Listed Below (If Applicable).  Please arrive at the [facility] main entrance of  [facility] at xx:xx AM (30-45 minutes prior to test start time)  [facility] [place], [place] [address] [phone]  Proceed to the [facility] Radiology Department (First Floor).  Please follow these instructions carefully (unless otherwise directed):  On the Night Before the Test:  Be sure to Drink plenty of water.  Do not consume any caffeinated/decaffeinated beverages or chocolate 12 hours prior to your test.  Do not take any antihistamines 12 hours prior to your test.   On the Day of the Test:  Drink plenty of water. Do not drink any water within one hour of the test.  Do not eat any food 4 hours prior to the test.  You may take your regular medications prior to the test.   Take 50 mg of your metoprolol succinate two hours prior to test. Just on this day only.    After the Test:  Drink plenty of water.  After receiving IV contrast, you may experience a mild flushed feeling. This is normal.  On occasion, you may experience a mild rash up to 24 hours after the test. This is not dangerous. If this occurs, you can take Benadryl 25 mg and increase your fluid intake.  If you experience trouble breathing, this can be serious. If it is severe call 911 IMMEDIATELY. If it is mild, please call our office.          Follow Up:  Virtual Visit or In Person 3 months  Signed, Sanda Klein, MD  12/01/2018 8:59 AM    [place]

## 2018-12-08 ENCOUNTER — Encounter (HOSPITAL_COMMUNITY): Payer: Self-pay | Admitting: Emergency Medicine

## 2018-12-08 ENCOUNTER — Other Ambulatory Visit: Payer: Self-pay

## 2018-12-08 DIAGNOSIS — R079 Chest pain, unspecified: Secondary | ICD-10-CM

## 2018-12-08 LAB — BASIC METABOLIC PANEL
BUN/Creatinine Ratio: 17 (ref 10–24)
BUN: 17 mg/dL (ref 8–27)
CO2: 19 mmol/L — ABNORMAL LOW (ref 20–29)
Calcium: 9.6 mg/dL (ref 8.6–10.2)
Chloride: 104 mmol/L (ref 96–106)
Creatinine, Ser: 0.99 mg/dL (ref 0.76–1.27)
GFR calc Af Amer: 93 mL/min/{1.73_m2} (ref >59)
GFR calc non Af Amer: 80 mL/min/{1.73_m2} (ref >59)
Glucose: 84 mg/dL (ref 65–99)
Potassium: 4.8 mmol/L (ref 3.5–5.2)
Sodium: 143 mmol/L (ref 134–144)

## 2018-12-09 ENCOUNTER — Telehealth (HOSPITAL_COMMUNITY): Payer: Self-pay | Admitting: Emergency Medicine

## 2018-12-09 NOTE — Telephone Encounter (Signed)
Left message on voicemail with name and callback number Terron Merfeld RN Navigator Cardiac Imaging [facility] [phone] Office [phone] Cell  

## 2018-12-10 ENCOUNTER — Ambulatory Visit (HOSPITAL_COMMUNITY)
Admission: RE | Admit: 2018-12-10 | Discharge: 2018-12-10 | Disposition: A | Discharge status: Home / Self Care | Payer: Managed Care, Other (non HMO) | Source: Ambulatory Visit | Attending: Cardiovascular Disease | Admitting: Cardiovascular Disease

## 2018-12-10 ENCOUNTER — Ambulatory Visit (HOSPITAL_COMMUNITY): Payer: Managed Care, Other (non HMO)

## 2018-12-10 ENCOUNTER — Other Ambulatory Visit: Payer: Self-pay

## 2018-12-10 DIAGNOSIS — R079 Chest pain, unspecified: Secondary | ICD-10-CM

## 2018-12-10 MED ORDER — NITROGLYCERIN 0.4 MG SL SUBL
SUBLINGUAL_TABLET | SUBLINGUAL | Status: AC
  Filled 2018-12-10: qty 2

## 2018-12-10 MED ORDER — IOHEXOL 350 MG/ML SOLN
100.0000 mL | Freq: Once | INTRAVENOUS | Status: AC | PRN
  Administered 2018-12-10: 100 mL via INTRAVENOUS

## 2018-12-10 MED ORDER — NITROGLYCERIN 0.4 MG SL SUBL
0.8000 mg | SUBLINGUAL_TABLET | Freq: Once | SUBLINGUAL | Status: AC
  Administered 2018-12-10: 0.8 mg via SUBLINGUAL
  Filled 2018-12-10: qty 25

## 2018-12-10 NOTE — Progress Notes (Signed)
CT scan completed. Tolerated well. D/C home walking, awake and alert. In no distress. 

## 2018-12-13 NOTE — Progress Notes (Signed)
Carelink Summary Report / Loop Recorder 

## 2018-12-15 ENCOUNTER — Ambulatory Visit (HOSPITAL_COMMUNITY)
Admission: RE | Admit: 2018-12-15 | Discharge: 2018-12-15 | Disposition: A | Discharge status: Home / Self Care | Payer: Managed Care, Other (non HMO) | Source: Ambulatory Visit | Attending: Cardiovascular Disease | Admitting: Cardiovascular Disease

## 2018-12-15 ENCOUNTER — Telehealth: Payer: Self-pay | Admitting: *Deleted

## 2018-12-15 ENCOUNTER — Other Ambulatory Visit: Payer: Self-pay

## 2018-12-15 DIAGNOSIS — E782 Mixed hyperlipidemia: Secondary | ICD-10-CM

## 2018-12-15 DIAGNOSIS — I480 Paroxysmal atrial fibrillation: Secondary | ICD-10-CM

## 2018-12-15 DIAGNOSIS — R079 Chest pain, unspecified: Secondary | ICD-10-CM | POA: Diagnosis not present

## 2018-12-15 NOTE — Telephone Encounter (Signed)
Left a message for the patient to call back.  

## 2018-12-15 NOTE — Telephone Encounter (Signed)
-----   Message from Sanda Klein, MD sent at 12/13/2018 10:31 AM EDT ----- First impression is that there is no meaningful blockage and overall burden of atherosclerotic plaque is average for Michael Lambert's age. He should continue taking the statin and the metoprolol (not on aspirin due to Eliquis anticoagulation). Will get additional analysis of the moderate (50-69%) blockage in the right coronary artery in the next 24-48 h and have a final report then. If the current report holds true, will not need additional testing at this time.

## 2018-12-16 MED ORDER — COQ10 100 MG PO CAPS: 300.0000 mg | ORAL_CAPSULE | Freq: Every day | ORAL | Status: DC

## 2018-12-16 MED ORDER — ROSUVASTATIN CALCIUM 20 MG PO TABS: 20.0000 mg | ORAL_TABLET | Freq: Every day | ORAL | 3 refills | Status: DC

## 2018-12-16 MED ORDER — METOPROLOL SUCCINATE ER 25 MG PO TB24: 37.5000 mg | ORAL_TABLET | Freq: Every day | ORAL | 3 refills | Status: DC

## 2018-12-16 NOTE — Telephone Encounter (Signed)
Dr. Sallyanne Kuster has called the patient and made him aware.  Updated results: There is only one area where the plaque is causing impaired blood flow and that is very far downstream the LAD artery (feeding the very tip of the left ventricle).  At that level, the vessel diameter is just under the size at which stents could help and there is no discrete blockage, but rather a gradual taper. It seems better suited for medical therapy, rather than an invasive procedure.  As mentioned, the overall amount of plaque is slightly worse than average for his age gender.  More importantly, we need to address the risk of plaque progression and plaque rupture, by aggressively lowering the LDL cholesterol (target under 70). He had problems with myalgia at higher atorvastatin doses (in fact, even at current dose has some complaints).  Recommend:  - stop atorvastatin and take a 1 months "statin holiday"  - in one month start rosuvastatin 20 mg + CoQ 10 300 mg daily  - check labs (lipid, CMET) after another 2 months  - increase metoprolol succinate to 37.5 mg daily (needs new Rx)  - carefully and gradually increase exercise again, avoid heat.

## 2018-12-29 ENCOUNTER — Ambulatory Visit (INDEPENDENT_AMBULATORY_CARE_PROVIDER_SITE_OTHER): Payer: Managed Care, Other (non HMO) | Admitting: *Deleted

## 2018-12-29 DIAGNOSIS — I639 Cerebral infarction, unspecified: Secondary | ICD-10-CM

## 2018-12-30 LAB — CUP PACEART REMOTE DEVICE CHECK
Date Time Interrogation Session: 20200813071647
Implantable Pulse Generator Implant Date: 20180703

## 2019-01-07 NOTE — Progress Notes (Signed)
Carelink Summary Report / Loop Recorder 

## 2019-01-17 DIAGNOSIS — E782 Mixed hyperlipidemia: Secondary | ICD-10-CM

## 2019-01-19 ENCOUNTER — Other Ambulatory Visit: Payer: Self-pay | Admitting: *Deleted

## 2019-01-19 DIAGNOSIS — I10 Essential (primary) hypertension: Secondary | ICD-10-CM

## 2019-01-19 DIAGNOSIS — E782 Mixed hyperlipidemia: Secondary | ICD-10-CM

## 2019-01-20 ENCOUNTER — Other Ambulatory Visit: Payer: Self-pay

## 2019-01-20 DIAGNOSIS — I10 Essential (primary) hypertension: Secondary | ICD-10-CM

## 2019-01-20 DIAGNOSIS — E782 Mixed hyperlipidemia: Secondary | ICD-10-CM

## 2019-01-20 DIAGNOSIS — I480 Paroxysmal atrial fibrillation: Secondary | ICD-10-CM

## 2019-01-20 LAB — COMPREHENSIVE METABOLIC PANEL
ALT: 23 IU/L (ref 0–44)
AST: 15 IU/L (ref 0–40)
Albumin/Globulin Ratio: 2.7 — ABNORMAL HIGH (ref 1.2–2.2)
Albumin: 5.1 g/dL — ABNORMAL HIGH (ref 3.8–4.8)
Alkaline Phosphatase: 61 IU/L (ref 39–117)
BUN/Creatinine Ratio: 18 (ref 10–24)
BUN: 17 mg/dL (ref 8–27)
Bilirubin Total: 0.7 mg/dL (ref 0.0–1.2)
CO2: 19 mmol/L — ABNORMAL LOW (ref 20–29)
Calcium: 9.9 mg/dL (ref 8.6–10.2)
Chloride: 101 mmol/L (ref 96–106)
Creatinine, Ser: 0.96 mg/dL (ref 0.76–1.27)
GFR calc Af Amer: 96 mL/min/{1.73_m2} (ref >59)
GFR calc non Af Amer: 83 mL/min/{1.73_m2} (ref >59)
Globulin, Total: 1.9 g/dL (ref 1.5–4.5)
Glucose: 101 mg/dL — ABNORMAL HIGH (ref 65–99)
Potassium: 4.8 mmol/L (ref 3.5–5.2)
Sodium: 139 mmol/L (ref 134–144)
Total Protein: 7 g/dL (ref 6.0–8.5)

## 2019-01-20 LAB — LIPID PANEL
Chol/HDL Ratio: 6.3 ratio — ABNORMAL HIGH (ref 0.0–5.0)
Cholesterol, Total: 292 mg/dL — ABNORMAL HIGH (ref 100–199)
HDL: 46 mg/dL (ref >39)
LDL Chol Calc (NIH): 216 mg/dL — ABNORMAL HIGH (ref 0–99)
Triglycerides: 157 mg/dL — ABNORMAL HIGH (ref 0–149)
VLDL Cholesterol Cal: 30 mg/dL (ref 5–40)

## 2019-01-20 LAB — CK: Total CK: 70 U/L (ref 41–331)

## 2019-01-21 ENCOUNTER — Telehealth: Payer: Self-pay | Admitting: *Deleted

## 2019-01-21 MED ORDER — ROSUVASTATIN CALCIUM 10 MG PO TABS: 10.0000 mg | ORAL_TABLET | Freq: Every day | ORAL | 3 refills | Status: DC

## 2019-01-21 NOTE — Telephone Encounter (Signed)
Left a message for the patient to call back.  

## 2019-01-21 NOTE — Telephone Encounter (Signed)
-----   Message from Sanda Klein, MD sent at 01/20/2019  5:22 PM EDT ----- Normal CK, no evidence of muscle problems at this time. Very high total cholesterol and LDL cholesterol, likely to represent familial heterozygous hypercholesterolemia.  Lipid lowering pharmacological therapy is indicated. Start rosuvastatin 10 mg daily (#90, RF 3) and CoQ10 300 mg daily. Recheck lipids and CK in 3 months please

## 2019-01-21 NOTE — Telephone Encounter (Signed)
Patient made aware of results and verbalized understanding.  See result note

## 2019-01-31 ENCOUNTER — Ambulatory Visit (INDEPENDENT_AMBULATORY_CARE_PROVIDER_SITE_OTHER): Payer: Medicare Other | Admitting: *Deleted

## 2019-01-31 DIAGNOSIS — I639 Cerebral infarction, unspecified: Secondary | ICD-10-CM | POA: Diagnosis not present

## 2019-02-01 LAB — CUP PACEART REMOTE DEVICE CHECK
Date Time Interrogation Session: 20200915085700
Implantable Pulse Generator Implant Date: 20180703

## 2019-02-11 NOTE — Progress Notes (Signed)
Carelink Summary Report / Loop Recorder 

## 2019-03-08 ENCOUNTER — Ambulatory Visit (INDEPENDENT_AMBULATORY_CARE_PROVIDER_SITE_OTHER): Payer: Medicare Other | Admitting: *Deleted

## 2019-03-08 DIAGNOSIS — I480 Paroxysmal atrial fibrillation: Secondary | ICD-10-CM | POA: Diagnosis not present

## 2019-03-08 DIAGNOSIS — I493 Ventricular premature depolarization: Secondary | ICD-10-CM

## 2019-03-09 LAB — CUP PACEART REMOTE DEVICE CHECK
Date Time Interrogation Session: 20201018104429
Implantable Pulse Generator Implant Date: 20180703

## 2019-03-24 NOTE — Progress Notes (Signed)
Carelink Summary Report / Loop Recorder 

## 2019-03-29 LAB — LIPID PANEL
Chol/HDL Ratio: 5.8 ratio — ABNORMAL HIGH (ref 0.0–5.0)
Cholesterol, Total: 283 mg/dL — ABNORMAL HIGH (ref 100–199)
HDL: 49 mg/dL (ref >39)
LDL Chol Calc (NIH): 207 mg/dL — ABNORMAL HIGH (ref 0–99)
Triglycerides: 146 mg/dL (ref 0–149)
VLDL Cholesterol Cal: 27 mg/dL (ref 5–40)

## 2019-03-29 LAB — CK: Total CK: 77 U/L (ref 41–331)

## 2019-03-30 ENCOUNTER — Other Ambulatory Visit: Payer: Self-pay | Admitting: *Deleted

## 2019-03-30 DIAGNOSIS — E782 Mixed hyperlipidemia: Secondary | ICD-10-CM

## 2019-03-30 MED ORDER — ROSUVASTATIN CALCIUM 20 MG PO TABS: 20.0000 mg | ORAL_TABLET | Freq: Every day | ORAL | 3 refills | Status: DC

## 2019-04-08 LAB — CUP PACEART REMOTE DEVICE CHECK
Date Time Interrogation Session: 20201120092455
Implantable Pulse Generator Implant Date: 20180703

## 2019-04-11 ENCOUNTER — Ambulatory Visit (INDEPENDENT_AMBULATORY_CARE_PROVIDER_SITE_OTHER): Payer: Medicare Other | Admitting: *Deleted

## 2019-04-11 DIAGNOSIS — Z8673 Personal history of transient ischemic attack (TIA), and cerebral infarction without residual deficits: Secondary | ICD-10-CM

## 2019-04-21 ENCOUNTER — Ambulatory Visit: Payer: Medicare Other | Admitting: Cardiovascular Disease

## 2019-04-21 ENCOUNTER — Other Ambulatory Visit: Payer: Self-pay

## 2019-04-21 VITALS — BP 136/80 | HR 78 | Temp 96.9°F | Ht 69.0 in | Wt 174.0 lb

## 2019-04-21 DIAGNOSIS — E782 Mixed hyperlipidemia: Secondary | ICD-10-CM

## 2019-04-21 DIAGNOSIS — I480 Paroxysmal atrial fibrillation: Secondary | ICD-10-CM

## 2019-04-21 DIAGNOSIS — I10 Essential (primary) hypertension: Secondary | ICD-10-CM

## 2019-04-21 DIAGNOSIS — I2510 Atherosclerotic heart disease of native coronary artery without angina pectoris: Secondary | ICD-10-CM | POA: Diagnosis not present

## 2019-04-21 DIAGNOSIS — Z7901 Long term (current) use of anticoagulants: Secondary | ICD-10-CM

## 2019-04-21 DIAGNOSIS — I517 Cardiomegaly: Secondary | ICD-10-CM

## 2019-04-21 NOTE — Progress Notes (Signed)
Cardiology Consultation Note    Date:  04/24/2019   ID:  Michael Lambert, DOB 1956/04/16, MRN [id number]  PCP:  Gaynelle Arabian, MD  Cardiologist:   Sanda Klein, MD   Chief Complaint  Patient presents with  . Atrial Fibrillation  . Coronary Artery Disease    History of Present Illness:  Michael Lambert is a 63 y.o. male with paroxysmal atrial fibrillation and history of TIA, moderate CAD by coronary CT angiography, here for follow-up.  After a flurry of problems with rapid heart rates in July, his arrhythmia has stabilized and he has not had any meaningful events in the last 3 months.  He continues to exercise, riding his mountain bike, without difficulty.  (Within hours of this appointment Michael Lambert called back with complaints of rapid palpitations while riding his mountain bicycle and his loop recorder download showed a 2-hour episode of atrial fibrillation with rapid ventricular response, average rate 160, peak over 200 bpm).    Coronary CT angiogram 12/15/2018 showed a calcium score at the 56th percentile had no significant coronary stenoses.  There was a moderate lesion right coronary artery ostium (50-69%.  FFR analysis showed a slow diffuse tapering of the LAD artery with the FFR in the distal vessel of 0.78 (2-2.5 mm vessel at that point).  The ostial RCA was not hemodynamically significant (FFR 0.95).  He has not had any bleeding complications, falls or injuries on Eliquis.  He is compliant with the rosuvastatin.  He has not had chest discomfort.  He experienced a transient ischemic attack in May 2018 and an implantable loop recorder showed that he does have brief episodes of atrial fibrillation.  He started anticoagulation with Eliquis which is tolerating well without bleeding problems.  He does not have any known structural heart disease, but does have mild left atrial dilation (end-systolic diameter of 41 mm) and a very small PFO.  The episodes of atrial fibrillation are infrequent, often  brief and usually nocturnal, although a couple of episodes have occurred during physical activity and have been lengthier, most notably in July 2020 and December 2020.    He had some musculoskeletal problems and we tried a statin holiday, that did really did not make a difference.  He has restarted his rosuvastatin and co-Q10 about 3 weeks ago.  He continues to be very physically active and is quite fit.  He has restricted his alcohol intake to no more than 2 drinks at night.    He has gastroesophageal reflux disease that is well controlled with omeprazole.  He does not have a family history of cardiac disease. His brother also has high cholesterol but has not had coronary other vascular problems by the age of 16. Unfortunately both Michael Lambert's parents died relatively young from ovarian cancer and multiple sclerosis respectively.  Past Medical History:  Diagnosis Date  . Anxiety    sees Dr. Pearson Grippe   . Atrial fibrillation ([place])    on Xarelto daily  . Benign prostatic hypertrophy   . GERD (gastroesophageal reflux disease)   . Hyperlipidemia   . Migraine   . Stroke [facility])    TIA memorial day 2018    Past Surgical History:  Procedure Laterality Date  . APPENDECTOMY    . colonoscopy  10-08-12   per Dr. Deatra Ina, adenomatous polyps, repeat in 5 yrs   . COLONOSCOPY    . CYSTOSCOPY     per Dr. Rosana Hoes-   . gum graft  08/2009   Dr Royston Cowper   .  HERNIA REPAIR  2008   bilateral inguinal, per Dr. Zella Richer  . LOOP RECORDER INSERTION N/A 11/18/2016   Procedure: Loop Recorder Insertion;  Surgeon: Thompson Grayer, MD;  Location: [facility];  Service: Cardiovascular;  Laterality: N/A;  . PALATE / UVULA BIOPSY / EXCISION  2004   per Dr. Lucia Gaskins, for snoring   . RADIAL KERATOTOMY     sees Dr. Mercer Pod as FU  at [facility] , bilateral,  dr stonecipher did the RK   . repair detached retina    . TEE WITHOUT CARDIOVERSION N/A 11/18/2016   Procedure: TRANSESOPHAGEAL ECHOCARDIOGRAM (TEE);   Surgeon: Acie Fredrickson Wonda Cheng, MD;  Location: [facility] ENDOSCOPY;  Service: Cardiovascular;  Laterality: N/A;  . TONSILLECTOMY      Current Medications: Outpatient Medications Prior to Visit  Medication Sig Dispense Refill  . cholecalciferol (VITAMIN D) 1000 units tablet Take 1,000 Units by mouth daily.    . Coenzyme Q10 (COQ10) 100 MG CAPS Take 300 mg by mouth daily. 30 capsule   . ELIQUIS 5 MG TABS tablet TAKE ONE TABLET BY MOUTH TWICE A DAY 60 tablet 4  . esomeprazole (NEXIUM) 20 MG capsule Take 20 mg by mouth daily at 12 noon.    . latanoprost (XALATAN) 0.005 % ophthalmic solution Place 1 drop into both eyes at bedtime.    . metoprolol succinate (TOPROL-XL) 25 MG 24 hr tablet Take 1.5 tablets (37.5 mg total) by mouth daily. 135 tablet 3  . Omega 3 1200 MG CAPS Take 1 capsule by mouth daily.     . rosuvastatin (CRESTOR) 20 MG tablet Take 1 tablet (20 mg total) by mouth daily. 90 tablet 3  . sertraline (ZOLOFT) 25 MG tablet Take 25 mg by mouth daily.    . predniSONE (DELTASONE) 20 MG tablet      No facility-administered medications prior to visit.      Allergies:   Patient has no known allergies.   Social History   Socioeconomic History  . Marital status: Married    Spouse name: Not on file  . Number of children: Not on file  . Years of education: Not on file  . Highest education level: Not on file  Occupational History  . Not on file  Social Needs  . Financial resource strain: Not on file  . Food insecurity    Worry: Not on file    Inability: Not on file  . Transportation needs    Medical: Not on file    Non-medical: Not on file  Tobacco Use  . Smoking status: Former Smoker    Quit date: 05/19/1980    Years since quitting: 38.9  . Smokeless tobacco: Never Used  Substance and Sexual Activity  . Alcohol use: Yes    Alcohol/week: 0.0 standard drinks    Comment: 2 glasses of wine each day  . Drug use: No  . Sexual activity: Not on file  Lifestyle  . Physical activity    Days  per week: Not on file    Minutes per session: Not on file  . Stress: Not on file  Relationships  . Social Herbalist on phone: Not on file    Gets together: Not on file    Attends religious service: Not on file    Active member of club or organization: Not on file    Attends meetings of clubs or organizations: Not on file    Relationship status: Not on file  Other Topics Concern  .  Not on file  Social History Narrative  . Not on file     Family History:  The patient's family history includes Hyperlipidemia in his father; Multiple sclerosis in an other family member; Ovarian cancer in his mother.   ROS:   Please see the history of present illness.    ROS All other systems are reviewed and are negative.  PHYSICAL EXAM:   VS:  BP 136/80   Pulse 78   Temp (!) 96.9 F (36.1 C)   Ht 5\' 9"  (1.753 m)   Wt 174 lb (78.9 kg)   SpO2 97%   BMI 25.70 kg/m      General: Alert, oriented x3, no distress, lean and fit Head: no evidence of trauma, PERRL, EOMI, no exophtalmos or lid lag, no myxedema, no xanthelasma; normal ears, nose and oropharynx Neck: normal jugular venous pulsations and no hepatojugular reflux; brisk carotid pulses without delay and no carotid bruits Chest: clear to auscultation, no signs of consolidation by percussion or palpation, normal fremitus, symmetrical and full respiratory excursions Cardiovascular: normal position and quality of the apical impulse, regular rhythm, normal first and second heart sounds, no murmurs, rubs or gallops Abdomen: no tenderness or distention, no masses by palpation, no abnormal pulsatility or arterial bruits, normal bowel sounds, no hepatosplenomegaly Extremities: no clubbing, cyanosis or edema; 2+ radial, ulnar and brachial pulses bilaterally; 2+ right femoral, posterior tibial and dorsalis pedis pulses; 2+ left femoral, posterior tibial and dorsalis pedis pulses; no subclavian or femoral bruits Neurological: grossly nonfocal  Psych: Normal mood and affect   Wt Readings from Last 3 Encounters:  04/21/19 174 lb (78.9 kg)  12/01/18 174 lb (78.9 kg)  03/05/18 171 lb (77.6 kg)      Studies/Labs Reviewed:   EKG:  EKG is ordered today.  Shows sinus bradycardia 59 bpm, QTC 435 ms  LABS: November 03, 2017 Normal liver function test, hemoglobin 15.2, normal thyroid tests Total cholesterol 166, HDL 42, LDL 104, triglycerides 104   Lipid Panel    Component Value Date/Time   CHOL 283 (H) 03/29/2019 0944   TRIG 146 03/29/2019 0944   HDL 49 03/29/2019 0944   CHOLHDL 5.8 (H) 03/29/2019 0944   CHOLHDL 4 12/14/2015 1205   VLDL 32.2 12/14/2015 1205   LDLCALC 207 (H) 03/29/2019 0944   LDLDIRECT 143.7 08/20/2012 0955     ASSESSMENT:    1. PAF (paroxysmal atrial fibrillation) ([place])   2. Coronary artery disease involving native coronary artery of native heart without angina pectoris   3. Long term current use of anticoagulant   4. Essential hypertension   5. Left atrial dilatation   6. Mixed hyperlipidemia      PLAN:  In order of problems listed above:  1. PAFib: Become more frequent.  His episodes have generally been brief, but this year he has had a couple of lengthy episodes associated with physical activity.  I do not know that he will tolerate a higher dose of beta-blocker since he has some bradycardia.  The episodes are very infrequent and at this point I am reluctant to prescribe antiarrhythmic therapy or recommend ablation, but this might be in the cards in the near future if the episodes increase in frequency.  CHADSVasc 3-4 (TIA , CAD, questionable hypertension).  2. CAD: No meaningful discrete stenoses.  The only area of possible ischemia is in the very distal LAD, downstream of a slow diffuse tapering.  No indication for revascularization. 3. Eliquis: Compliant, no bleeding complications.  Reminded  him to always wear a helmet if he rides his bicycle. 4. HTN: Mild, reasonably well controlled on the  beta-blocker as prescribed for rate control. 5. LA dilation on echo: Increases the likelihood of future atrial fibrillation events.  It may be related to hypertension, although there was no evidence of systolic or diastolic left ventricular dysfunction on echo. He might have an atrial myopathy. 6. HLP: With the new information regarding his coronaries, target LDL cholesterol is <70.  He has just restarted his statin so we will recheck labs in about 3 weeks.  If he is close to target, will add Zetia.  If he requires more than 20% reduction in LDL cholesterol we will discuss Repatha or Praluent.  It's quite likely that he has [place].  Had hypertriglyceridemia that improved with alcohol reduction.  He leads a very healthy lifestyle and is very lean and fit.    Medication Adjustments/Labs and Tests Ordered: Current medicines are reviewed at length with the patient today.  Concerns regarding medicines are outlined above.  Medication changes, Labs and Tests ordered today are listed in the Patient Instructions below. Patient Instructions  Medication Instructions:  No changes *If you need a refill on your cardiac medications before your next appointment, please call your pharmacy*  Lab Work: Your provider would like for you to return in 3 weeks to have the following labs drawn: fasting Lipid. You do not need an appointment for the lab. Once in our office lobby there is a podium where you can sign in and ring the doorbell to alert [place] that you are here. The lab is open from 8:00 am to 4:30 pm; closed for lunch from 12:45pm-1:45pm.  If you have labs (blood work) drawn today and your tests are completely normal, you will receive your results only by: Marland Kitchen MyChart Message (if you have MyChart) OR . A paper copy in the mail If you have any lab test that is abnormal or we need to change your treatment, we will call you to review the results.  Testing/Procedures: None ordered  Follow-Up: At [facility], you  and your health needs are our priority.  As part of our continuing mission to provide you with exceptional heart care, we have created designated Provider Care Teams.  These Care Teams include your primary Cardiologist (physician) and Advanced Practice Providers (APPs -  Physician Assistants and Nurse Practitioners) who all work together to provide you with the care you need, when you need it.  Your next appointment:   12 month(s)  The format for your next appointment:   In Person  Provider:   Sanda Klein, MD     Signed, Sanda Klein, MD  04/24/2019 9:48 AM    [address], [place], [place]  [address] Phone: [phone]; Fax: [phone]

## 2019-04-21 NOTE — Patient Instructions (Signed)
Medication Instructions:  No changes *If you need a refill on your cardiac medications before your next appointment, please call your pharmacy*  Lab Work: Your provider would like for you to return in 3 weeks to have the following labs drawn: fasting Lipid. You do not need an appointment for the lab. Once in our office lobby there is a podium where you can sign in and ring the doorbell to alert [place] that you are here. The lab is open from 8:00 am to 4:30 pm; closed for lunch from 12:45pm-1:45pm.  If you have labs (blood work) drawn today and your tests are completely normal, you will receive your results only by: Marland Kitchen MyChart Message (if you have MyChart) OR . A paper copy in the mail If you have any lab test that is abnormal or we need to change your treatment, we will call you to review the results.  Testing/Procedures: None ordered  Follow-Up: At [facility], you and your health needs are our priority.  As part of our continuing mission to provide you with exceptional heart care, we have created designated Provider Care Teams.  These Care Teams include your primary Cardiologist (physician) and Advanced Practice Providers (APPs -  Physician Assistants and Nurse Practitioners) who all work together to provide you with the care you need, when you need it.  Your next appointment:   12 month(s)  The format for your next appointment:   In Person  Provider:   Sanda Klein, MD

## 2019-04-21 NOTE — Telephone Encounter (Signed)
Reviewed Linq download. 2 hour episode of atrial fibrillation with RVR has just resolved. During physical activity, ventricular rate was briefly >200. At rest heart rate earlier today was in high 50s, sinus bradycardia. Recommend stopping exercise for symptomatic tachycardia and taking extra 12.5 metoprolol. This is second symptomatic event in 6 months. If atrial fibrillation incidence continues to increase, consider referral to EP for ablation or antiarrhythmic drug.

## 2019-04-24 ENCOUNTER — Encounter: Payer: Self-pay | Admitting: Cardiovascular Disease

## 2019-04-24 DIAGNOSIS — I2510 Atherosclerotic heart disease of native coronary artery without angina pectoris: Secondary | ICD-10-CM | POA: Insufficient documentation

## 2019-04-24 DIAGNOSIS — I10 Essential (primary) hypertension: Secondary | ICD-10-CM | POA: Insufficient documentation

## 2019-05-12 ENCOUNTER — Ambulatory Visit (INDEPENDENT_AMBULATORY_CARE_PROVIDER_SITE_OTHER): Payer: Medicare Other | Admitting: *Deleted

## 2019-05-12 DIAGNOSIS — Z8673 Personal history of transient ischemic attack (TIA), and cerebral infarction without residual deficits: Secondary | ICD-10-CM

## 2019-05-12 LAB — CUP PACEART REMOTE DEVICE CHECK
Date Time Interrogation Session: 20201223230828
Implantable Pulse Generator Implant Date: 20180703

## 2019-05-15 NOTE — Progress Notes (Signed)
ILR remote 

## 2019-05-26 LAB — LIPID PANEL
Chol/HDL Ratio: 4 ratio (ref 0.0–5.0)
Cholesterol, Total: 180 mg/dL (ref 100–199)
HDL: 45 mg/dL (ref >39)
LDL Chol Calc (NIH): 109 mg/dL — ABNORMAL HIGH (ref 0–99)
Triglycerides: 150 mg/dL — ABNORMAL HIGH (ref 0–149)
VLDL Cholesterol Cal: 26 mg/dL (ref 5–40)

## 2019-06-03 ENCOUNTER — Other Ambulatory Visit: Payer: Self-pay | Admitting: *Deleted

## 2019-06-03 MED ORDER — EZETIMIBE 10 MG PO TABS: 10.0000 mg | ORAL_TABLET | Freq: Every day | ORAL | 3 refills | Status: DC

## 2019-06-08 ENCOUNTER — Other Ambulatory Visit: Payer: Self-pay | Admitting: *Deleted

## 2019-06-08 DIAGNOSIS — I480 Paroxysmal atrial fibrillation: Secondary | ICD-10-CM

## 2019-06-08 MED ORDER — MULTAQ 400 MG PO TABS: 400.0000 mg | ORAL_TABLET | Freq: Two times a day (BID) | ORAL | 5 refills | Status: DC

## 2019-06-10 ENCOUNTER — Ambulatory Visit: Payer: Medicare Other | Attending: Internal Medicine

## 2019-06-10 DIAGNOSIS — Z23 Encounter for immunization: Secondary | ICD-10-CM | POA: Insufficient documentation

## 2019-06-10 NOTE — Progress Notes (Signed)
   Covid-19 Vaccination Clinic  Name:  Michael Lambert    MRN: [id number] DOB: 08/29/1955  06/10/2019  [name] was observed post Covid-19 immunization for 15 minutes without incidence. He was provided with Vaccine Information Sheet and instruction to access the V-Safe system.   [name] was instructed to call 911 with any severe reactions post vaccine: Marland Kitchen Difficulty breathing  . Swelling of your face and throat  . A fast heartbeat  . A bad rash all over your body  . Dizziness and weakness    Immunizations Administered    Name Date Dose VIS Date Route   Pfizer COVID-19 Vaccine 06/10/2019  8:54 AM 0.3 mL 04/29/2019 Intramuscular   Manufacturer: [place]   Lot: [id number]   [place]: [id number]

## 2019-06-13 ENCOUNTER — Ambulatory Visit (INDEPENDENT_AMBULATORY_CARE_PROVIDER_SITE_OTHER): Payer: Medicare Other | Admitting: *Deleted

## 2019-06-13 DIAGNOSIS — Z8673 Personal history of transient ischemic attack (TIA), and cerebral infarction without residual deficits: Secondary | ICD-10-CM

## 2019-06-13 LAB — CUP PACEART REMOTE DEVICE CHECK
Date Time Interrogation Session: 20210124231645
Implantable Pulse Generator Implant Date: 20180703

## 2019-06-19 NOTE — Progress Notes (Signed)
Implantable loop recorder download. Occasional brief episodes of paroxysmal atrial fibrillation (3 episodes in last 30 days, all at rest in late evening hours, 2 episodes on New Year's Eve, longest episode 20 minutes). Normal battery status.

## 2019-06-27 ENCOUNTER — Encounter: Payer: Self-pay | Admitting: Cardiology

## 2019-06-27 ENCOUNTER — Ambulatory Visit: Payer: Medicare Other | Admitting: Cardiology

## 2019-06-27 ENCOUNTER — Other Ambulatory Visit: Payer: Self-pay

## 2019-06-27 VITALS — BP 124/76 | HR 55 | Ht 69.0 in | Wt 177.2 lb

## 2019-06-27 DIAGNOSIS — I480 Paroxysmal atrial fibrillation: Secondary | ICD-10-CM | POA: Diagnosis not present

## 2019-06-27 DIAGNOSIS — Z01810 Encounter for preprocedural cardiovascular examination: Secondary | ICD-10-CM

## 2019-06-27 DIAGNOSIS — Z01812 Encounter for preprocedural laboratory examination: Secondary | ICD-10-CM

## 2019-06-27 NOTE — Patient Instructions (Signed)
Medication Instructions:  Your physician recommends that you continue on your current medications as directed. Please refer to the Current Medication list given to you today.  *If you need a refill on your cardiac medications before your next appointment, please call your pharmacy*  Lab Work: Your physician recommends that you return for lab work on 07/26/19: BMET & CBC If you have labs (blood work) drawn today and your tests are completely normal, you will receive your results only by: Marland Kitchen MyChart Message (if you have MyChart) OR . A paper copy in the mail If you have any lab test that is abnormal or we need to change your treatment, we will call you to review the results.  Testing/Procedures: Your physician has requested that you have cardiac CT within 7 days prior to your ablation. Cardiac computed tomography (CT) is a painless test that uses an x-ray machine to take clear, detailed pictures of your heart. For further information please visit [email or web address]. Please follow instruction below located under special instructions. You will get a call from our office to schedule the date for this test.  Your physician has recommended that you have an ablation. Catheter ablation is a medical procedure used to treat some cardiac arrhythmias (irregular heartbeats). During catheter ablation, a long, thin, flexible tube is put into a blood vessel in your groin (upper thigh), or neck. This tube is called an ablation catheter. It is then guided to your heart through the blood vessel. Radio frequency waves destroy small areas of heart tissue where abnormal heartbeats may cause an arrhythmia to start. Please see the instructions below located under special instructions  Follow-Up: You are scheduled for a "virtual phone call visit" on 07/26/19 @ 4:15 pm, with Dr. Curt Bears  Your physician recommends that you schedule a follow-up appointment in: 4 weeks, after your procedure on 08/17/2019, with Roderic Palau NP in  the AFib clinic.  Your physician recommends that you schedule a follow up appointment in: 3 months, after your procedure on 08/17/2019, with Dr. Curt Bears.  Thank you for choosing [facility]!! Trinidad Curet, RN [phone]    Any Other Special Instructions Will Be Listed Below    CT INSTRUCTIONS Your cardiac CT will be scheduled at:  [facility] [address] [place], [place] [address] [phone]  Please arrive at the [facility] main entrance of [facility] at ___________ on ____________. Please arrive 30-45 minutes prior to test start time. Proceed to the [facility] Department (first floor) to check-in and test prep.  Please follow these instructions carefully (unless otherwise directed):  Hold all erectile dysfunction medications at least 48 hours prior to test.  On the Night Before the Test: . Be sure to Drink plenty of water. . Do not consume any caffeinated/decaffeinated beverages or chocolate 12 hours prior to your test. . Do not take any antihistamines 12 hours prior to your test. . If you take Metformin do not take 24 hours prior to test. . If the patient has contrast allergy: ? Patient will need a prescription for Prednisone and very clear instructions (as follows): 1. Prednisone 50 mg - take 13 hours prior to test 2. Take another Prednisone 50 mg 7 hours prior to test 3. Take another Prednisone 50 mg 1 hour prior to test 4. Take Benadryl 50 mg 1 hour prior to test . Patient must complete all four doses of above prophylactic medications. . Patient will need a ride after test due to Benadryl.  On  the Day of the Test: . Drink plenty of water. Do not drink any water within one hour of the test. . Do not eat any food 4 hours prior to the test. . You may take your regular medications prior to the test.  . Take your Toprol the night before this test. . HOLD Furosemide/Hydrochlorothiazide morning of the test.      After the  Test: . Drink plenty of water. . After receiving IV contrast, you may experience a mild flushed feeling. This is normal. . On occasion, you may experience a mild rash up to 24 hours after the test. This is not dangerous. If this occurs, you can take Benadryl 25 mg and increase your fluid intake. . If you experience trouble breathing, this can be serious. If it is severe call 911 IMMEDIATELY. If it is mild, please call our office.   Please contact the cardiac imaging nurse navigator should you have any questions/concerns Marchia Bond, RN Navigator Cardiac Imaging [facility] [phone] Office  [phone] Cell       Electrophysiology/Ablation Procedure Instructions   You are scheduled for a(n)  ablation on 08/17/2019 with Dr. Allegra Lai.   1.   Pre procedure testing-             A.  LAB WORK --- On 07/26/2019 for your pre procedure blood work.                 B. COVID TEST-- On 08/15/2019 @ 10:00 am - You will go to [facility] hospital ([place]) for your Covid testing.   This is a drive thru test site.  There will be multiple testing areas.  Be sure to share with the first checkpoint that you are there for pre-procedure/surgery testing. This will put you into the right (yellow) lane that leads to the PAT testing team. Stay in your car and the nurse team will come to your car to test you.  After you are tested please go home and self quarantine until the day of your procedure.     2. On the day of your procedure 08/17/2019 you will go to [facility] [phone] [address]) at 6:30 am.  Dennis Bast will go to the main entrance A [employer]) and enter where the [employer] are.  Your driver will drop you off and you will head down the hallway to ADMITTING.  You may have one support person come in to the hospital with you.  They will be asked to wait in the waiting room.   3.   Do not eat or drink after midnight prior to your procedure.     4.   Do NOT take any medications the morning of your procedure.   5.  Plan for an overnight stay.  If you use your phone frequently bring your phone charger.   6. You will follow up with the AFIB clinic 4 weeks after your procedure.  You will follow up with Dr. Curt Bears  3 months after your procedure.  These appointments will be made for you.   * If you have ANY questions please call the office [phone] and ask for Cayleigh Paull RN or send me a MyChart message   * Occasionally, EP Studies and ablations can become lengthy.  Please make your family aware of this before your procedure starts.  Average time ranges from 2-8 hours for EP studies/ablations.  Your physician will call your family  after the procedure with the results.                                     Cardiac Ablation Cardiac ablation is a procedure to disable (ablate) a small amount of heart tissue in very specific places. The heart has many electrical connections. Sometimes these connections are abnormal and can cause the heart to beat very fast or irregularly. Ablating some of the problem areas can improve the heart rhythm or return it to normal. Ablation may be done for people who:  Have Wolff-Parkinson-White syndrome.  Have fast heart rhythms (tachycardia).  Have taken medicines for an abnormal heart rhythm (arrhythmia) that were not effective or caused side effects.  Have a high-risk heartbeat that may be life-threatening. During the procedure, a small incision is made in the neck or the groin, and a long, thin, flexible tube (catheter) is inserted into the incision and moved to the heart. Small devices (electrodes) on the tip of the catheter will send out electrical currents. A type of X-ray (fluoroscopy) will be used to help guide the catheter and to provide images of the heart. Tell a health care provider about:  Any allergies you have.  All medicines you are taking, including vitamins, herbs, eye drops, creams, and  over-the-counter medicines.  Any problems you or family members have had with anesthetic medicines.  Any blood disorders you have.  Any surgeries you have had.  Any medical conditions you have, such as kidney failure.  Whether you are pregnant or may be pregnant. What are the risks? Generally, this is a safe procedure. However, problems may occur, including:  Infection.  Bruising and bleeding at the catheter insertion site.  Bleeding into the chest, especially into the sac that surrounds the heart. This is a serious complication.  Stroke or blood clots.  Damage to other structures or organs.  Allergic reaction to medicines or dyes.  Need for a permanent pacemaker if the normal electrical system is damaged. A pacemaker is a small computer that sends electrical signals to the heart and helps your heart beat normally.  The procedure not being fully effective. This may not be recognized until months later. Repeat ablation procedures are sometimes required. What happens before the procedure?  Follow instructions from your health care provider about eating or drinking restrictions.  Ask your health care provider about: ? Changing or stopping your regular medicines. This is especially important if you are taking diabetes medicines or blood thinners. ? Taking medicines such as aspirin and ibuprofen. These medicines can thin your blood. Do not take these medicines before your procedure if your health care provider instructs you not to.  Plan to have someone take you home from the hospital or clinic.  If you will be going home right after the procedure, plan to have someone with you for 24 hours. What happens during the procedure?  To lower your risk of infection: ? Your health care team will wash or sanitize their hands. ? Your skin will be washed with soap. ? Hair may be removed from the incision area.  An IV tube will be inserted into one of your veins.  You will be given a  medicine to help you relax (sedative).  The skin on your neck or groin will be numbed.  An incision will be made in your neck or your groin.  A needle will be inserted through the  incision and into a large vein in your neck or groin.  A catheter will be inserted into the needle and moved to your heart.  Dye may be injected through the catheter to help your surgeon see the area of the heart that needs treatment.  Electrical currents will be sent from the catheter to ablate heart tissue in desired areas. There are three types of energy that may be used to ablate heart tissue: ? Heat (radiofrequency energy). ? Laser energy. ? Extreme cold (cryoablation).  When the necessary tissue has been ablated, the catheter will be removed.  Pressure will be held on the catheter insertion area to prevent excessive bleeding.  A bandage (dressing) will be placed over the catheter insertion area. The procedure may vary among health care providers and hospitals. What happens after the procedure?  Your blood pressure, heart rate, breathing rate, and blood oxygen level will be monitored until the medicines you were given have worn off.  Your catheter insertion area will be monitored for bleeding. You will need to lie still for a few hours to ensure that you do not bleed from the catheter insertion area.  Do not drive for 24 hours or as long as directed by your health care provider. Summary  Cardiac ablation is a procedure to disable (ablate) a small amount of heart tissue in very specific places. Ablating some of the problem areas can improve the heart rhythm or return it to normal.  During the procedure, electrical currents will be sent from the catheter to ablate heart tissue in desired areas. This information is not intended to replace advice given to you by your health care provider. Make sure you discuss any questions you have with your health care provider. Document Revised: 10/26/2017 Document  Reviewed: 03/24/2016 Elsevier Patient Education  [place].

## 2019-06-27 NOTE — Progress Notes (Signed)
Thank you, sir.

## 2019-06-27 NOTE — Progress Notes (Signed)
Electrophysiology Office Note   Date:  06/27/2019   ID:  Michael Lambert, DOB August 21, 1955, MRN [id number]  PCP:  Gaynelle Arabian, MD  Cardiologist:  [place] Primary Electrophysiologist:  Syanne Looney Meredith Leeds, MD    Chief Complaint: AF   History of Present Illness: Michael Lambert is a 64 y.o. male who is being seen today for the evaluation of AF at the request of Croitoru, Mihai, MD. Presenting today for electrophysiology evaluation.  He has a history significant for paroxysmal atrial fibrillation, TIA, moderate coronary artery disease on coronary CT.  In July, he had multiple episodes of rapid heart rates.  Fortunately his atrial fibrillation has been reduced over the last 3 months.  He continues to exercise.  Despite that, he has had recent episodes of atrial fibrillation.  He had a TIA in May 2018 with a loop monitor implanted at that time.  Today, he denies symptoms of palpitations, chest pain, shortness of breath, orthopnea, PND, lower extremity edema, claudication, dizziness, presyncope, syncope, bleeding, or neurologic sequela. The patient is tolerating medications without difficulties.  He has been started on Multaq.  Despite that, he continues to have episodic palpitations, though his Linq monitor has shown no evidence of atrial fibrillation since January 20.  His main symptoms with his atrial fibrillation or palpitations.  He feels mild fatigue, though otherwise has done well.   Past Medical History:  Diagnosis Date  . Anxiety    sees Dr. Pearson Grippe   . Atrial fibrillation ([place])    on Xarelto daily  . Benign prostatic hypertrophy   . GERD (gastroesophageal reflux disease)   . Hyperlipidemia   . Migraine   . Stroke [facility])    TIA memorial day 2018   Past Surgical History:  Procedure Laterality Date  . APPENDECTOMY    . colonoscopy  10-08-12   per Dr. Deatra Ina, adenomatous polyps, repeat in 5 yrs   . COLONOSCOPY    . CYSTOSCOPY     per Dr. Rosana Hoes-   . gum graft  08/2009   Dr  Royston Cowper   . HERNIA REPAIR  2008   bilateral inguinal, per Dr. Zella Richer  . LOOP RECORDER INSERTION N/A 11/18/2016   Procedure: Loop Recorder Insertion;  Surgeon: Thompson Grayer, MD;  Location: [place] [facility];  Service: Cardiovascular;  Laterality: N/A;  . PALATE / UVULA BIOPSY / EXCISION  2004   per Dr. Lucia Gaskins, for snoring   . RADIAL KERATOTOMY     sees Dr. Mercer Pod as FU  at [facility] , bilateral,  dr stonecipher did the RK   . repair detached retina    . TEE WITHOUT CARDIOVERSION N/A 11/18/2016   Procedure: TRANSESOPHAGEAL ECHOCARDIOGRAM (TEE);  Surgeon: Acie Fredrickson Wonda Cheng, MD;  Location: [facility] ENDOSCOPY;  Service: Cardiovascular;  Laterality: N/A;  . TONSILLECTOMY       Current Outpatient Medications  Medication Sig Dispense Refill  . cholecalciferol (VITAMIN D) 1000 units tablet Take 1,000 Units by mouth daily.    . Coenzyme Q10 (COQ10) 100 MG CAPS Take 300 mg by mouth daily. 30 capsule   . dronedarone (MULTAQ) 400 MG tablet Take 1 tablet (400 mg total) by mouth 2 (two) times daily with a meal. 60 tablet 5  . ELIQUIS 5 MG TABS tablet TAKE ONE TABLET BY MOUTH TWICE A DAY 60 tablet 4  . esomeprazole (NEXIUM) 20 MG capsule Take 20 mg by mouth daily at 12 noon.    . ezetimibe (ZETIA) 10 MG tablet Take 1 tablet (  10 mg total) by mouth daily. 90 tablet 3  . latanoprost (XALATAN) 0.005 % ophthalmic solution Place 1 drop into both eyes at bedtime.    . metoprolol succinate (TOPROL-XL) 25 MG 24 hr tablet Take 25 mg by mouth daily.    . Omega 3 1200 MG CAPS Take 1 capsule by mouth daily.     . rosuvastatin (CRESTOR) 20 MG tablet Take 1 tablet (20 mg total) by mouth daily. 90 tablet 3  . sertraline (ZOLOFT) 25 MG tablet Take 25 mg by mouth daily.     No current facility-administered medications for this visit.    Allergies:   Patient has no known allergies.   Social History:  The patient  reports that he quit smoking about 39 years ago. He has never used smokeless tobacco. He reports  current alcohol use. He reports that he does not use drugs.   Family History:  The patient's family history includes Hyperlipidemia in his father; Multiple sclerosis in an other family member; Ovarian cancer in his mother.    ROS:  Please see the history of present illness.   Otherwise, review of systems is positive for none.   All other systems are reviewed and negative.    PHYSICAL EXAM: VS:  BP 124/76   Pulse (!) 55   Ht 5\' 9"  (1.753 m)   Wt 177 lb 3.2 oz (80.4 kg)   SpO2 96%   BMI 26.17 kg/m  , BMI Body mass index is 26.17 kg/m. GEN: Well nourished, well developed, in no acute distress  HEENT: normal  Neck: no JVD, carotid bruits, or masses Cardiac: RRR; no murmurs, rubs, or gallops,no edema  Respiratory:  clear to auscultation bilaterally, normal work of breathing GI: soft, nontender, nondistended, + BS MS: no deformity or atrophy  Skin: warm and dry Neuro:  Strength and sensation are intact Psych: euthymic mood, full affect  EKG:  EKG is ordered today. Personal review of the ekg ordered shows sinus rhythm, rate 55  Recent Labs: 01/20/2019: ALT 23; BUN 17; Creatinine, Ser 0.96; Potassium 4.8; Sodium 139    Lipid Panel     Component Value Date/Time   CHOL 180 05/26/2019 1030   TRIG 150 (H) 05/26/2019 1030   HDL 45 05/26/2019 1030   CHOLHDL 4.0 05/26/2019 1030   CHOLHDL 4 12/14/2015 1205   VLDL 32.2 12/14/2015 1205   LDLCALC 109 (H) 05/26/2019 1030   LDLDIRECT 143.7 08/20/2012 0955     Wt Readings from Last 3 Encounters:  06/27/19 177 lb 3.2 oz (80.4 kg)  04/21/19 174 lb (78.9 kg)  12/01/18 174 lb (78.9 kg)      Other studies Reviewed: Additional studies/ records that were reviewed today include: TTE 2017  Review of the above records today demonstrates:  - Left ventricle: Systolic function was normal. The estimated  ejection fraction was in the range of 60% to 65%. Left  ventricular diastolic function parameters were normal.  - Left atrium: The  atrium was mildly dilated.  - Atrial septum: No defect or patent foramen ovale was identified.    ASSESSMENT AND PLAN:  1.  Paroxysmal atrial fibrillation: Has become more frequent.  CHA2DS2-VASc of at least 3.  Currently on Multaq and Eliquis.  Fortunately continues to have episodic atrial fibrillation.  He would prefer to be off of medications.  Due to that, we Tyisha Cressy plan for AF ablation.  Risks and benefits were discussed which include bleeding, tamponade, heart block, stroke, damage surrounding organs.  He understands  these risks and has agreed to the procedure.  2.  Coronary artery disease: Mild on coronary CT.  No current chest pain.  Plan per primary cardiology.  Case discussed with referring cardiologist  Current medicines are reviewed at length with the patient today.   The patient does not have concerns regarding his medicines.  The following changes were made today:  none  Labs/ tests ordered today include:  Orders Placed This Encounter  Procedures  . CT CARDIAC MORPH/PULM VEIN W/CM&W/O CA SCORE  . CT CORONARY FRACTIONAL FLOW RESERVE DATA PREP  . CT CORONARY FRACTIONAL FLOW RESERVE FLUID ANALYSIS  . Basic metabolic panel  . CBC  . EKG 12-Lead     Disposition:   FU with Shaleah Nissley 3 months  Signed, Rosalio Catterton Meredith Leeds, MD  06/27/2019 12:31 PM     [facility] [address] [place] [address] [phone] (office) [phone] (fax)

## 2019-07-01 ENCOUNTER — Ambulatory Visit: Payer: Medicare Other | Attending: Internal Medicine

## 2019-07-01 DIAGNOSIS — Z23 Encounter for immunization: Secondary | ICD-10-CM | POA: Insufficient documentation

## 2019-07-01 NOTE — Progress Notes (Signed)
   Covid-19 Vaccination Clinic  Name:  Kayse Losee    MRN: [id number] DOB: 13-Apr-1956  07/01/2019  Mr. Bang was observed post Covid-19 immunization for 15 minutes without incidence. He was provided with Vaccine Information Sheet and instruction to access the V-Safe system.   Mr. Socarras was instructed to call 911 with any severe reactions post vaccine: Marland Kitchen Difficulty breathing  . Swelling of your face and throat  . A fast heartbeat  . A bad rash all over your body  . Dizziness and weakness

## 2019-07-06 ENCOUNTER — Ambulatory Visit: Payer: Medicare Other

## 2019-07-14 ENCOUNTER — Ambulatory Visit (INDEPENDENT_AMBULATORY_CARE_PROVIDER_SITE_OTHER): Payer: Medicare Other | Admitting: *Deleted

## 2019-07-14 DIAGNOSIS — Z8673 Personal history of transient ischemic attack (TIA), and cerebral infarction without residual deficits: Secondary | ICD-10-CM | POA: Diagnosis not present

## 2019-07-14 LAB — CUP PACEART REMOTE DEVICE CHECK
Date Time Interrogation Session: 20210224235551
Implantable Pulse Generator Implant Date: 20180703

## 2019-07-15 NOTE — Progress Notes (Signed)
ILR Remote 

## 2019-07-26 ENCOUNTER — Telehealth: Payer: Medicare Other | Admitting: Cardiology

## 2019-07-26 ENCOUNTER — Other Ambulatory Visit: Payer: Medicare Other

## 2019-08-12 ENCOUNTER — Ambulatory Visit (HOSPITAL_COMMUNITY): Payer: Medicare Other

## 2019-08-14 LAB — CUP PACEART REMOTE DEVICE CHECK
Date Time Interrogation Session: 20210328022533
Implantable Pulse Generator Implant Date: 20180703

## 2019-08-15 ENCOUNTER — Other Ambulatory Visit (HOSPITAL_COMMUNITY): Payer: Medicare Other

## 2019-08-15 ENCOUNTER — Ambulatory Visit (INDEPENDENT_AMBULATORY_CARE_PROVIDER_SITE_OTHER): Payer: Medicare Other | Admitting: *Deleted

## 2019-08-15 DIAGNOSIS — Z8673 Personal history of transient ischemic attack (TIA), and cerebral infarction without residual deficits: Secondary | ICD-10-CM | POA: Diagnosis not present

## 2019-08-15 NOTE — Progress Notes (Signed)
ILR Remote 

## 2019-09-15 LAB — CUP PACEART REMOTE DEVICE CHECK
Date Time Interrogation Session: 20210428232028
Implantable Pulse Generator Implant Date: 20180703

## 2019-09-16 ENCOUNTER — Other Ambulatory Visit: Payer: Self-pay | Admitting: Family Medicine

## 2019-09-16 DIAGNOSIS — M25551 Pain in right hip: Secondary | ICD-10-CM

## 2019-09-19 ENCOUNTER — Ambulatory Visit (INDEPENDENT_AMBULATORY_CARE_PROVIDER_SITE_OTHER): Payer: Medicare Other | Admitting: *Deleted

## 2019-09-19 DIAGNOSIS — Z8673 Personal history of transient ischemic attack (TIA), and cerebral infarction without residual deficits: Secondary | ICD-10-CM | POA: Diagnosis not present

## 2019-09-20 NOTE — Progress Notes (Signed)
Carelink Summary Report / Loop Recorder 

## 2019-10-17 LAB — CUP PACEART REMOTE DEVICE CHECK
Date Time Interrogation Session: 20210529232832
Implantable Pulse Generator Implant Date: 20180703

## 2019-10-19 ENCOUNTER — Ambulatory Visit
Admission: RE | Admit: 2019-10-19 | Discharge: 2019-10-19 | Disposition: A | Discharge status: Home / Self Care | Payer: Medicare Other | Source: Ambulatory Visit | Attending: Family Medicine | Admitting: Family Medicine

## 2019-10-19 ENCOUNTER — Other Ambulatory Visit: Payer: Self-pay

## 2019-10-19 ENCOUNTER — Ambulatory Visit (INDEPENDENT_AMBULATORY_CARE_PROVIDER_SITE_OTHER): Payer: Medicare Other | Admitting: *Deleted

## 2019-10-19 DIAGNOSIS — M25551 Pain in right hip: Secondary | ICD-10-CM

## 2019-10-19 DIAGNOSIS — G459 Transient cerebral ischemic attack, unspecified: Secondary | ICD-10-CM | POA: Diagnosis not present

## 2019-10-25 NOTE — Progress Notes (Signed)
Carelink Summary Report / Loop Recorder 

## 2019-11-18 ENCOUNTER — Ambulatory Visit (INDEPENDENT_AMBULATORY_CARE_PROVIDER_SITE_OTHER): Payer: Medicare Other | Admitting: *Deleted

## 2019-11-18 DIAGNOSIS — I480 Paroxysmal atrial fibrillation: Secondary | ICD-10-CM | POA: Diagnosis not present

## 2019-11-18 LAB — CUP PACEART REMOTE DEVICE CHECK
Date Time Interrogation Session: 20210701230947
Implantable Pulse Generator Implant Date: 20180703

## 2019-11-22 NOTE — Progress Notes (Signed)
Carelink Summary Report / Loop Recorder 

## 2019-12-11 ENCOUNTER — Other Ambulatory Visit: Payer: Self-pay | Admitting: Cardiovascular Disease

## 2019-12-14 ENCOUNTER — Telehealth: Payer: Self-pay | Admitting: *Deleted

## 2019-12-14 NOTE — Telephone Encounter (Signed)
Thanks

## 2019-12-14 NOTE — Telephone Encounter (Signed)
Rx(s) sent to pharmacy electronically.  

## 2019-12-14 NOTE — Telephone Encounter (Signed)
Followed up with patient as I had not heard back from him about ablation  (last spoke many months ago and he was considering May/June). Pt reports that he is doing considerably well on his current medications.  He would like to wait on procedure for now, being that he is maintaining on Multaq.   He will let Dr. Sallyanne Kuster know if he re-considers at a later time and will be referred back to re-discuss. Pt aware he will follow up in December w/ Dr. Sallyanne Kuster, recall in [facility] Pt appreciates my follow up.  Will forward to Dr. Sallyanne Kuster & Camnitz for their [place].

## 2019-12-24 LAB — CUP PACEART REMOTE DEVICE CHECK
Date Time Interrogation Session: 20210803231553
Implantable Pulse Generator Implant Date: 20180703

## 2019-12-26 ENCOUNTER — Ambulatory Visit (INDEPENDENT_AMBULATORY_CARE_PROVIDER_SITE_OTHER): Payer: Medicare Other | Admitting: *Deleted

## 2019-12-26 DIAGNOSIS — G459 Transient cerebral ischemic attack, unspecified: Secondary | ICD-10-CM

## 2019-12-27 NOTE — Progress Notes (Signed)
Carelink Summary Report / Loop Recorder 

## 2020-01-02 ENCOUNTER — Other Ambulatory Visit: Payer: Self-pay | Admitting: Cardiovascular Disease

## 2020-01-13 ENCOUNTER — Other Ambulatory Visit: Payer: Self-pay | Admitting: Internal Medicine

## 2020-01-13 DIAGNOSIS — E236 Other disorders of pituitary gland: Secondary | ICD-10-CM

## 2020-01-26 LAB — CUP PACEART REMOTE DEVICE CHECK
Date Time Interrogation Session: 20210905233457
Implantable Pulse Generator Implant Date: 20180703

## 2020-01-30 ENCOUNTER — Ambulatory Visit (INDEPENDENT_AMBULATORY_CARE_PROVIDER_SITE_OTHER): Payer: Medicare Other | Admitting: *Deleted

## 2020-01-30 DIAGNOSIS — I639 Cerebral infarction, unspecified: Secondary | ICD-10-CM

## 2020-01-31 NOTE — Progress Notes (Signed)
Carelink Summary Report / Loop Recorder 

## 2020-02-01 ENCOUNTER — Other Ambulatory Visit: Payer: Self-pay | Admitting: Cardiovascular Disease

## 2020-02-17 ENCOUNTER — Other Ambulatory Visit: Payer: Self-pay

## 2020-02-17 ENCOUNTER — Ambulatory Visit
Admission: RE | Admit: 2020-02-17 | Discharge: 2020-02-17 | Disposition: A | Discharge status: Home / Self Care | Payer: Medicare Other | Source: Ambulatory Visit | Attending: Internal Medicine | Admitting: Internal Medicine

## 2020-02-17 DIAGNOSIS — E236 Other disorders of pituitary gland: Secondary | ICD-10-CM

## 2020-02-17 MED ORDER — GADOBENATE DIMEGLUMINE 529 MG/ML IV SOLN
8.0000 mL | Freq: Once | INTRAVENOUS | Status: AC | PRN
  Administered 2020-02-17: 8 mL via INTRAVENOUS

## 2020-02-25 LAB — CUP PACEART REMOTE DEVICE CHECK
Date Time Interrogation Session: 20211008233752
Implantable Pulse Generator Implant Date: 20180703

## 2020-03-05 ENCOUNTER — Ambulatory Visit (INDEPENDENT_AMBULATORY_CARE_PROVIDER_SITE_OTHER): Payer: Medicare Other

## 2020-03-05 DIAGNOSIS — I639 Cerebral infarction, unspecified: Secondary | ICD-10-CM | POA: Diagnosis not present

## 2020-03-09 NOTE — Progress Notes (Signed)
Carelink Summary Report / Loop Recorder 

## 2020-04-09 ENCOUNTER — Ambulatory Visit (INDEPENDENT_AMBULATORY_CARE_PROVIDER_SITE_OTHER): Payer: Medicare Other

## 2020-04-09 DIAGNOSIS — I639 Cerebral infarction, unspecified: Secondary | ICD-10-CM

## 2020-04-09 LAB — CUP PACEART REMOTE DEVICE CHECK
Date Time Interrogation Session: 20211121233533
Implantable Pulse Generator Implant Date: 20180703

## 2020-04-11 ENCOUNTER — Telehealth: Payer: Self-pay | Admitting: Emergency Medicine

## 2020-04-11 NOTE — Telephone Encounter (Signed)
Patient will send remote transmission 04/15/20 when he returns home to allow the review of all episodes recorded on LINQ. Dr Marlena Clipper will be forwarded information from recordings.

## 2020-04-16 NOTE — Telephone Encounter (Signed)
Manual transmission received providing ECGS for AF episodes.  Longest/ fastest episode recorded below.    Pt meds include Multaq 400mg  BID, Eliquis 5 mg BID, Metoprolol 37.5mg  daily.  Pt has known history of PAF.

## 2020-04-16 NOTE — Progress Notes (Signed)
Carelink Summary Report / Loop Recorder 

## 2020-05-14 ENCOUNTER — Ambulatory Visit (INDEPENDENT_AMBULATORY_CARE_PROVIDER_SITE_OTHER): Payer: Medicare Other

## 2020-05-14 DIAGNOSIS — I639 Cerebral infarction, unspecified: Secondary | ICD-10-CM | POA: Diagnosis not present

## 2020-05-15 LAB — CUP PACEART REMOTE DEVICE CHECK
Date Time Interrogation Session: 20211224233709
Implantable Pulse Generator Implant Date: 20180703

## 2020-05-20 ENCOUNTER — Other Ambulatory Visit: Payer: Self-pay | Admitting: Cardiovascular Disease

## 2020-05-25 NOTE — Progress Notes (Signed)
Carelink Summary Report / Loop Recorder 

## 2020-05-28 ENCOUNTER — Telehealth: Payer: Self-pay | Admitting: Cardiovascular Disease

## 2020-05-28 NOTE — Telephone Encounter (Signed)
Will route to nurses of providers to do a provider switch as patient has previously seen Dr.Camnitz, and would like to see Dr.Allred.   Thank you!

## 2020-05-28 NOTE — Telephone Encounter (Signed)
Called patient, he is not currently having any issues but wants to get his ablation scheduled. Advised I would send a message to MD and his nurse to make them aware so we could get this going for him. Patient was thankful for call back.

## 2020-05-28 NOTE — Telephone Encounter (Signed)
Can we please get him back in with EP? He prefers Dr. Rayann Heman. Thank you!

## 2020-05-28 NOTE — Telephone Encounter (Signed)
Patient is requesting to schedule an ablation with Dr. Rayann Heman. He states this was discussed during past appointments and he is now ready to schedule.  Patient c/o Palpitations:  High priority if patient c/o lightheadedness, shortness of breath, or chest pain  1) How long have you had palpitations/irregular HR/ Afib? Are you having the symptoms now? Patient states he went into afib on 05/27/20 when mountain biking, but he is not currently in afib  2) Are you currently experiencing lightheadedness, SOB or CP? No   3) Do you have a history of afib (atrial fibrillation) or irregular heart rhythm? Yes   4) Have you checked your BP or HR? (document readings if available): no readings available  5) Are you experiencing any other symptoms? Dizziness   STAT if patient feels like he/she is going to faint   1) Are you dizzy now? No   2) Do you feel faint or have you passed out? No   3) Do you have any other symptoms? No   4) Have you checked your HR and BP (record if available)? No readings available

## 2020-05-29 NOTE — Telephone Encounter (Signed)
Ok to switch 

## 2020-05-31 NOTE — Telephone Encounter (Signed)
It looks like I saw him several years ago.  I am happy to see him.

## 2020-06-01 NOTE — Telephone Encounter (Signed)
Approval from both MDs.   Forwarding to scheduler to arrange......Marland Kitchen

## 2020-06-08 ENCOUNTER — Encounter: Payer: Self-pay | Admitting: *Deleted

## 2020-06-08 ENCOUNTER — Other Ambulatory Visit: Payer: Self-pay

## 2020-06-08 ENCOUNTER — Encounter: Payer: Self-pay | Admitting: Internal Medicine

## 2020-06-08 ENCOUNTER — Ambulatory Visit: Payer: Medicare Other | Admitting: Internal Medicine

## 2020-06-08 VITALS — BP 146/80 | HR 64 | Ht 69.0 in | Wt 179.6 lb

## 2020-06-08 DIAGNOSIS — G459 Transient cerebral ischemic attack, unspecified: Secondary | ICD-10-CM

## 2020-06-08 DIAGNOSIS — I4891 Unspecified atrial fibrillation: Secondary | ICD-10-CM | POA: Diagnosis not present

## 2020-06-08 DIAGNOSIS — I480 Paroxysmal atrial fibrillation: Secondary | ICD-10-CM

## 2020-06-08 LAB — CUP PACEART INCLINIC DEVICE CHECK
Date Time Interrogation Session: 20220121154234
Implantable Pulse Generator Implant Date: 20180703

## 2020-06-08 NOTE — Patient Instructions (Signed)
Medication Instructions:  Continue current medications  *If you need a refill on your cardiac medications before your next appointment, please call your pharmacy*   Lab Work: CBC, BMP If you have labs (blood work) drawn today and your tests are completely normal, you will receive your results only by:  [facility] (if you have MyChart) OR  A paper copy in the mail If you have any lab test that is abnormal or we need to change your treatment, we will call you to review the results.   Testing/Procedures: please schedule echo Your physician has requested that you have an echocardiogram. Echocardiography is a painless test that uses sound waves to create images of your heart. It provides your doctor with information about the size and shape of your heart and how well your hearts chambers and valves are working. This procedure takes approximately one hour. There are no restrictions for this procedure.  Follow-Up: At [facility], you and your health needs are our priority.  As part of our continuing mission to provide you with exceptional heart care, we have created designated Provider Care Teams.  These Care Teams include your primary Cardiologist (physician) and Advanced Practice Providers (APPs -  Physician Assistants and Nurse Practitioners) who all work together to provide you with the care you need, when you need it.  We recommend signing up for the patient portal called "MyChart".  Sign up information is provided on this After Visit Summary.  MyChart is used to connect with patients for Virtual Visits (Telemedicine).  Patients are able to view lab/test results, encounter notes, upcoming appointments, etc.  Non-urgent messages can be sent to your provider as well.   To learn more about what you can do with MyChart, go to [email or web address].    Other Instructions  Cardiac Ablation Cardiac ablation is a procedure to destroy (ablate) some heart tissue that is sending bad signals.  These bad signals cause problems in heart rhythm. The heart has many areas that make these signals. If there are problems in these areas, they can make the heart beat in a way that is not normal. Destroying some tissues can help make the heart rhythm normal. Tell your doctor about:  Any allergies you have.  All medicines you are taking. These include vitamins, herbs, eye drops, creams, and over-the-counter medicines.  Any problems you or family members have had with medicines that make you fall asleep (anesthetics).  Any blood disorders you have.  Any surgeries you have had.  Any medical conditions you have, such as kidney failure.  Whether you are pregnant or may be pregnant. What are the risks? This is a safe procedure. But problems may occur, including:  Infection.  Bruising and bleeding.  Bleeding into the chest.  Stroke or blood clots.  Damage to nearby areas of your body.  Allergies to medicines or dyes.  The need for a pacemaker if the normal system is damaged.  Failure of the procedure to treat the problem. What happens before the procedure? Medicines Ask your doctor about:  Changing or stopping your normal medicines. This is important.  Taking aspirin and ibuprofen. Do not take these medicines unless your doctor tells you to take them.  Taking other medicines, vitamins, herbs, and supplements. General instructions  Follow instructions from your doctor about what you cannot eat or drink.  Plan to have someone take you home from the hospital or clinic.  If you will be going home right after the procedure, plan to have someone  with you for 24 hours.  Ask your doctor what steps will be taken to prevent infection. What happens during the procedure?  An IV tube will be put into one of your veins.  You will be given a medicine to help you relax.  The skin on your neck or groin will be numbed.  A cut (incision) will be made in your neck or groin. A needle  will be put through your cut and into a large vein.  A tube (catheter) will be put into the needle. The tube will be moved to your heart.  Dye may be put through the tube. This helps your doctor see your heart.  Small devices (electrodes) on the tube will send out signals.  A type of energy will be used to destroy some heart tissue.  The tube will be taken out.  Pressure will be held on your cut. This helps stop bleeding.  A bandage will be put over your cut. The exact procedure may vary among doctors and hospitals.   What happens after the procedure?  You will be watched until you leave the hospital or clinic. This includes checking your heart rate, breathing rate, oxygen, and blood pressure.  Your cut will be watched for bleeding. You will need to lie still for a few hours.  Do not drive for 24 hours or as long as your doctor tells you. Summary  Cardiac ablation is a procedure to destroy some heart tissue. This is done to treat heart rhythm problems.  Tell your doctor about any medical conditions you may have. Tell him or her about all medicines you are taking to treat them.  This is a safe procedure. But problems may occur. These include infection, bruising, bleeding, and damage to nearby areas of your body.  Follow what your doctor tells you about food and drink. You may also be told to change or stop some of your medicines.  After the procedure, do not drive for 24 hours or as long as your doctor tells you. This information is not intended to replace advice given to you by your health care provider. Make sure you discuss any questions you have with your health care provider. Document Revised: 04/07/2019 Document Reviewed: 04/07/2019 Elsevier Patient Education  2021 [employer].

## 2020-06-08 NOTE — H&P (View-Only) (Signed)
PCP: Gaynelle Arabian, MD Primary Cardiologist: Dr Sallyanne Kuster Primary EP: Dr Orene Desanctis is a 65 y.o. male who presents today for routine electrophysiology followup.  He underwent ILR implant by me in 2018 for TIA evaluation.  He was subsequently found to have afib.  He was evaluated by Dr Curt Bears 06/27/19 (his note reviewed) for consideration of different management strategies.  He has been treated with multaq.  He continues to have afib. + palpitations and fatigue.  He also does not like taking multaq due to fatigue.  Today, he denies symptoms of palpitations, chest pain, shortness of breath,  lower extremity edema, dizziness, presyncope, or syncope.  The patient is otherwise without complaint today.   Past Medical History:  Diagnosis Date  . Anxiety    sees Dr. Pearson Grippe   . Atrial fibrillation ([place])    on Xarelto daily  . Benign prostatic hypertrophy   . GERD (gastroesophageal reflux disease)   . Hyperlipidemia   . Migraine   . Stroke [facility])    TIA memorial day 2018   Past Surgical History:  Procedure Laterality Date  . APPENDECTOMY    . colonoscopy  10-08-12   per Dr. Deatra Ina, adenomatous polyps, repeat in 5 yrs   . COLONOSCOPY    . CYSTOSCOPY     per Dr. Rosana Hoes-   . gum graft  08/2009   Dr Royston Cowper   . HERNIA REPAIR  2008   bilateral inguinal, per Dr. Zella Richer  . LOOP RECORDER INSERTION N/A 11/18/2016   Procedure: Loop Recorder Insertion;  Surgeon: Thompson Grayer, MD;  Location: [facility];  Service: Cardiovascular;  Laterality: N/A;  . PALATE / UVULA BIOPSY / EXCISION  2004   per Dr. Lucia Gaskins, for snoring   . RADIAL KERATOTOMY     sees Dr. Mercer Pod as FU  at [facility] , bilateral,  dr stonecipher did the RK   . repair detached retina    . TEE WITHOUT CARDIOVERSION N/A 11/18/2016   Procedure: TRANSESOPHAGEAL ECHOCARDIOGRAM (TEE);  Surgeon: Acie Fredrickson Wonda Cheng, MD;  Location: [facility] ENDOSCOPY;  Service: Cardiovascular;  Laterality: N/A;  . TONSILLECTOMY       ROS- all systems are reviewed and negatives except as per HPI above  Current Outpatient Medications  Medication Sig Dispense Refill  . cholecalciferol (VITAMIN D) 1000 units tablet Take 1,000 Units by mouth daily.    Marland Kitchen dronedarone (MULTAQ) 400 MG tablet Take 1 tablet (400 mg total) by mouth 2 (two) times daily with a meal. 60 tablet 5  . ELIQUIS 5 MG TABS tablet TAKE ONE TABLET BY MOUTH TWICE A DAY 60 tablet 4  . esomeprazole (NEXIUM) 20 MG capsule Take 20 mg by mouth daily at 12 noon.    . ezetimibe (ZETIA) 10 MG tablet TAKE ONE TABLET BY MOUTH DAILY 90 tablet 0  . latanoprost (XALATAN) 0.005 % ophthalmic solution Place 1 drop into both eyes at bedtime.    . metoprolol succinate (TOPROL-XL) 25 MG 24 hr tablet TAKE 1 AND 1/2 TABLET BY MOUTH DAILY 45 tablet 4  . Omega 3 1200 MG CAPS Take 1 capsule by mouth daily.     . rosuvastatin (CRESTOR) 20 MG tablet TAKE ONE TABLET BY MOUTH DAILY 90 tablet 1  . sertraline (ZOLOFT) 25 MG tablet Take 25 mg by mouth daily.     No current facility-administered medications for this visit.    Physical Exam: Vitals:   06/08/20 1531  BP: (!) 146/80  Pulse:  64  SpO2: 98%  Weight: 179 lb 9.6 oz (81.5 kg)  Height: 5\' 9"  (1.753 m)    GEN- The patient is well appearing, alert and oriented x 3 today.   Head- normocephalic, atraumatic Eyes-  Sclera clear, conjunctiva pink Ears- hearing intact Oropharynx- clear Lungs-  normal work of breathing Heart- Regular rate and rhythm  GI- soft,  Extremities- no clubbing, cyanosis, or edema  Wt Readings from Last 3 Encounters:  06/08/20 179 lb 9.6 oz (81.5 kg)  06/27/19 177 lb 3.2 oz (80.4 kg)  04/21/19 174 lb (78.9 kg)    EKG tracing ordered today is personally reviewed and shows sinus rhythm, qtc 445 msec  Assessment and Plan:  1. Paroxysmal atrial fibrillation Burden <1%, longest episode was an hour 05/26/20. The patient has symptomatic, recurrent paroxysmal atrial fibrillation. he has failed  medical therapy with multaq. Chads2vasc score is 3.  he is anticoagulated with eliquis . Therapeutic strategies for afib including medicine and ablation were discussed in detail with the patient today. Risk, benefits, and alternatives to EP study and radiofrequency ablation for afib were also discussed in detail today. These risks include but are not limited to stroke, bleeding, vascular damage, tamponade, perforation, damage to the esophagus, lungs, and other structures, pulmonary vein stenosis, worsening renal function, and death. The patient understands these risk and wishes to proceed.  We will therefore proceed with catheter ablation at the next available time.  Carto, ICE, anesthesia are requested for the procedure.  Will also obtain cardiac CT prior to the procedure to exclude LAA thrombus and further evaluate atrial anatomy. Obtain an echo to evaluate for structural changes related to afib.  2. Prior stroke Continue life long anticoagulation  Risks, benefits and potential toxicities for medications prescribed and/or refilled reviewed with patient today.   Thompson Grayer MD, [facility] 06/08/2020 3:32 PM

## 2020-06-08 NOTE — Progress Notes (Signed)
PCP: Gaynelle Arabian, MD Primary Cardiologist: Dr Sallyanne Kuster Primary EP: Dr Orene Desanctis is a 65 y.o. male who presents today for routine electrophysiology followup.  He underwent ILR implant by me in 2018 for TIA evaluation.  He was subsequently found to have afib.  He was evaluated by Dr Curt Bears 06/27/19 (his note reviewed) for consideration of different management strategies.  He has been treated with multaq.  He continues to have afib. + palpitations and fatigue.  He also does not like taking multaq due to fatigue.  Today, he denies symptoms of palpitations, chest pain, shortness of breath,  lower extremity edema, dizziness, presyncope, or syncope.  The patient is otherwise without complaint today.   Past Medical History:  Diagnosis Date  . Anxiety    sees Dr. Pearson Grippe   . Atrial fibrillation ([place])    on Xarelto daily  . Benign prostatic hypertrophy   . GERD (gastroesophageal reflux disease)   . Hyperlipidemia   . Migraine   . Stroke [facility])    TIA memorial day 2018   Past Surgical History:  Procedure Laterality Date  . APPENDECTOMY    . colonoscopy  10-08-12   per Dr. Deatra Ina, adenomatous polyps, repeat in 5 yrs   . COLONOSCOPY    . CYSTOSCOPY     per Dr. Rosana Hoes-   . gum graft  08/2009   Dr Royston Cowper   . HERNIA REPAIR  2008   bilateral inguinal, per Dr. Zella Richer  . LOOP RECORDER INSERTION N/A 11/18/2016   Procedure: Loop Recorder Insertion;  Surgeon: Thompson Grayer, MD;  Location: [facility] [facility];  Service: Cardiovascular;  Laterality: N/A;  . PALATE / UVULA BIOPSY / EXCISION  2004   per Dr. Lucia Gaskins, for snoring   . RADIAL KERATOTOMY     sees Dr. Mercer Pod as FU  at [facility] , bilateral,  dr stonecipher did the RK   . repair detached retina    . TEE WITHOUT CARDIOVERSION N/A 11/18/2016   Procedure: TRANSESOPHAGEAL ECHOCARDIOGRAM (TEE);  Surgeon: Acie Fredrickson Wonda Cheng, MD;  Location: [facility] ENDOSCOPY;  Service: Cardiovascular;  Laterality: N/A;  . TONSILLECTOMY       ROS- all systems are reviewed and negatives except as per HPI above  Current Outpatient Medications  Medication Sig Dispense Refill  . cholecalciferol (VITAMIN D) 1000 units tablet Take 1,000 Units by mouth daily.    Marland Kitchen dronedarone (MULTAQ) 400 MG tablet Take 1 tablet (400 mg total) by mouth 2 (two) times daily with a meal. 60 tablet 5  . ELIQUIS 5 MG TABS tablet TAKE ONE TABLET BY MOUTH TWICE A DAY 60 tablet 4  . esomeprazole (NEXIUM) 20 MG capsule Take 20 mg by mouth daily at 12 noon.    . ezetimibe (ZETIA) 10 MG tablet TAKE ONE TABLET BY MOUTH DAILY 90 tablet 0  . latanoprost (XALATAN) 0.005 % ophthalmic solution Place 1 drop into both eyes at bedtime.    . metoprolol succinate (TOPROL-XL) 25 MG 24 hr tablet TAKE 1 AND 1/2 TABLET BY MOUTH DAILY 45 tablet 4  . Omega 3 1200 MG CAPS Take 1 capsule by mouth daily.     . rosuvastatin (CRESTOR) 20 MG tablet TAKE ONE TABLET BY MOUTH DAILY 90 tablet 1  . sertraline (ZOLOFT) 25 MG tablet Take 25 mg by mouth daily.     No current facility-administered medications for this visit.    Physical Exam: Vitals:   06/08/20 1531  BP: (!) 146/80  Pulse:  64  SpO2: 98%  Weight: 179 lb 9.6 oz (81.5 kg)  Height: 5' 9" (1.753 m)    GEN- The patient is well appearing, alert and oriented x 3 today.   Head- normocephalic, atraumatic Eyes-  Sclera clear, conjunctiva pink Ears- hearing intact Oropharynx- clear Lungs-  normal work of breathing Heart- Regular rate and rhythm  GI- soft,  Extremities- no clubbing, cyanosis, or edema  Wt Readings from Last 3 Encounters:  06/08/20 179 lb 9.6 oz (81.5 kg)  06/27/19 177 lb 3.2 oz (80.4 kg)  04/21/19 174 lb (78.9 kg)    EKG tracing ordered today is personally reviewed and shows sinus rhythm, qtc 445 msec  Assessment and Plan:  1. Paroxysmal atrial fibrillation Burden <1%, longest episode was an hour 05/26/20. The patient has symptomatic, recurrent paroxysmal atrial fibrillation. he has failed  medical therapy with multaq. Chads2vasc score is 3.  he is anticoagulated with eliquis . Therapeutic strategies for afib including medicine and ablation were discussed in detail with the patient today. Risk, benefits, and alternatives to EP study and radiofrequency ablation for afib were also discussed in detail today. These risks include but are not limited to stroke, bleeding, vascular damage, tamponade, perforation, damage to the esophagus, lungs, and other structures, pulmonary vein stenosis, worsening renal function, and death. The patient understands these risk and wishes to proceed.  We will therefore proceed with catheter ablation at the next available time.  Carto, ICE, anesthesia are requested for the procedure.  Will also obtain cardiac CT prior to the procedure to exclude LAA thrombus and further evaluate atrial anatomy. Obtain an echo to evaluate for structural changes related to afib.  2. Prior stroke Continue life long anticoagulation  Risks, benefits and potential toxicities for medications prescribed and/or refilled reviewed with patient today.   Emilynn Srinivasan MD, FACC 06/08/2020 3:32 PM     

## 2020-06-09 LAB — CBC WITH DIFFERENTIAL/PLATELET
Basophils Absolute: 0.1 10*3/uL (ref 0.0–0.2)
Basos: 1 %
EOS (ABSOLUTE): 0.1 10*3/uL (ref 0.0–0.4)
Eos: 1 %
Hematocrit: 44 % (ref 37.5–51.0)
Hemoglobin: 15.1 g/dL (ref 13.0–17.7)
Immature Grans (Abs): 0 10*3/uL (ref 0.0–0.1)
Immature Granulocytes: 0 %
Lymphocytes Absolute: 2.8 10*3/uL (ref 0.7–3.1)
Lymphs: 34 %
MCH: 32.7 pg (ref 26.6–33.0)
MCHC: 34.3 g/dL (ref 31.5–35.7)
MCV: 95 fL (ref 79–97)
Monocytes Absolute: 0.7 10*3/uL (ref 0.1–0.9)
Monocytes: 8 %
Neutrophils Absolute: 4.6 10*3/uL (ref 1.4–7.0)
Neutrophils: 56 %
Platelets: 205 10*3/uL (ref 150–450)
RBC: 4.62 x10E6/uL (ref 4.14–5.80)
RDW: 11.9 % (ref 11.6–15.4)
WBC: 8.4 10*3/uL (ref 3.4–10.8)

## 2020-06-09 LAB — BASIC METABOLIC PANEL
BUN/Creatinine Ratio: 12 (ref 10–24)
BUN: 13 mg/dL (ref 8–27)
CO2: 23 mmol/L (ref 20–29)
Calcium: 9.7 mg/dL (ref 8.6–10.2)
Chloride: 103 mmol/L (ref 96–106)
Creatinine, Ser: 1.07 mg/dL (ref 0.76–1.27)
GFR calc Af Amer: 83 mL/min/{1.73_m2} (ref >59)
GFR calc non Af Amer: 72 mL/min/{1.73_m2} (ref >59)
Glucose: 90 mg/dL (ref 65–99)
Potassium: 4.4 mmol/L (ref 3.5–5.2)
Sodium: 140 mmol/L (ref 134–144)

## 2020-06-10 ENCOUNTER — Other Ambulatory Visit: Payer: Self-pay | Admitting: Cardiovascular Disease

## 2020-06-12 ENCOUNTER — Telehealth: Payer: Medicare Other | Admitting: Physician Assistant

## 2020-06-12 NOTE — Progress Notes (Signed)
Virtual Visit via Telephone Note   This visit type was conducted due to national recommendations for restrictions regarding the COVID-19 Pandemic (e.g. social distancing) in an effort to limit this patient's exposure and mitigate transmission in our community.  Due to his co-morbid illnesses, this patient is at least at moderate risk for complications without adequate follow up.  This format is felt to be most appropriate for this patient at this time.  The patient did not have access to video technology/had technical difficulties with video requiring transitioning to audio format only (telephone).  All issues noted in this document were discussed and addressed.  No physical exam could be performed with this format.  Please refer to the patient's chart for his  consent to telehealth for [facility].    Date:  06/13/2020   ID:  Michael Lambert, DOB [birth year], MRN [id number] The patient was identified using 2 identifiers.  Patient Location: Home Provider Location: Office/Clinic  PCP:  Gaynelle Arabian, MD  Cardiologist:  Sanda Klein, MD  Electrophysiologist:  Thompson Grayer, MD   Evaluation Performed:  Follow-Up Visit  Chief Complaint:  Afib, HTN  History of Present Illness:    Michael Lambert is a 65 y.o. male with PAF on chronic anticoagulation, PVCs, HTN, CAD, GERD, HLD, hx of TIA, and anxiety.  He underwent CT coronary on 12/15/2018 that showed calcium score in the 56th percentile with no significant coronary artery stenosis.  He had a 50 to 69% lesion in the ostial RCA that was not significant by FFR.  Medical therapy was advised.  He had a TIA in May 2018 and subsequently had implantable loop recorder placed.  ILR showed episodes of atrial fibrillation. He was started on anticoagulation and multaq. He is fit and exercises regularly. He has had increased episodes of RVR with exercise and fatigue. Given his symptomatic Afib, he was referred to EP and is currently planning for Afib ablation next  month with Dr. Rayann Heman.   He presents today for routine follow up.  He denies chest pain and shortness of breath. He exercises every day with swimming and biking without angina. We discussed prior studies and risk factor modification. He questions if the ablation will fix his "flutter" in the middle of the night. We discussed differences in Afib and PVCs.Marland Kitchen He will discuss further with Dr. Rayann Heman.   The patient does not have symptoms concerning for COVID-19 infection (fever, chills, cough, or new shortness of breath).    Past Medical History:  Diagnosis Date  . Anxiety    sees Dr. Pearson Grippe   . Atrial fibrillation ([place])    on Xarelto daily  . Benign prostatic hypertrophy   . GERD (gastroesophageal reflux disease)   . Hyperlipidemia   . Migraine   . Stroke [facility])    TIA memorial day 2018   Past Surgical History:  Procedure Laterality Date  . APPENDECTOMY    . colonoscopy  10-08-12   per Dr. Deatra Ina, adenomatous polyps, repeat in 5 yrs   . COLONOSCOPY    . CYSTOSCOPY     per Dr. Rosana Hoes-   . gum graft  08/2009   Dr Royston Cowper   . HERNIA REPAIR  2008   bilateral inguinal, per Dr. Zella Richer  . LOOP RECORDER INSERTION N/A 11/18/2016   Procedure: Loop Recorder Insertion;  Surgeon: Thompson Grayer, MD;  Location: [facility];  Service: Cardiovascular;  Laterality: N/A;  . PALATE / UVULA BIOPSY / EXCISION  2004   per Dr.  Lucia Gaskins, for snoring   . RADIAL KERATOTOMY     sees Dr. Mercer Pod as FU  at [facility] , bilateral,  dr stonecipher did the RK   . repair detached retina    . TEE WITHOUT CARDIOVERSION N/A 11/18/2016   Procedure: TRANSESOPHAGEAL ECHOCARDIOGRAM (TEE);  Surgeon: Acie Fredrickson Wonda Cheng, MD;  Location: [facility] ENDOSCOPY;  Service: Cardiovascular;  Laterality: N/A;  . TONSILLECTOMY       Current Meds  Medication Sig  . cholecalciferol (VITAMIN D) 1000 units tablet Take 1,000 Units by mouth daily.  Marland Kitchen ELIQUIS 5 MG TABS tablet TAKE ONE TABLET BY MOUTH TWICE A DAY  .  esomeprazole (NEXIUM) 20 MG capsule Take 20 mg by mouth daily at 12 noon.  . ezetimibe (ZETIA) 10 MG tablet TAKE ONE TABLET BY MOUTH DAILY  . latanoprost (XALATAN) 0.005 % ophthalmic solution Place 1 drop into both eyes at bedtime.  . metoprolol succinate (TOPROL-XL) 25 MG 24 hr tablet TAKE 1 AND 1/2 TABLET BY MOUTH DAILY  . MULTAQ 400 MG tablet TAKE ONE TABLET BY MOUTH TWICE A DAY WITH MEALS  . sertraline (ZOLOFT) 25 MG tablet Take 25 mg by mouth daily.  . [DISCONTINUED] rosuvastatin (CRESTOR) 20 MG tablet TAKE ONE TABLET BY MOUTH DAILY     Allergies:   Patient has no known allergies.   Social History   Tobacco Use  . Smoking status: Former Smoker    Quit date: 05/19/1980    Years since quitting: 40.0  . Smokeless tobacco: Never Used  Vaping Use  . Vaping Use: Never used  Substance Use Topics  . Alcohol use: Yes    Alcohol/week: 0.0 standard drinks    Comment: 2 glasses of wine each day  . Drug use: No     Family Hx: The patient's family history includes Hyperlipidemia in his father; Multiple sclerosis in an other family member; Ovarian cancer in his mother. There is no history of Colon cancer, Esophageal cancer, Rectal cancer, Stomach cancer, or Colon polyps.  ROS:   Please see the history of present illness.     All other systems reviewed and are negative.   Prior CV studies:   The following studies were reviewed today:  Echo 2017 Study Conclusions   - Left ventricle: Systolic function was normal. The estimated  ejection fraction was in the range of 60% to 65%. Left  ventricular diastolic function parameters were normal.  - Left atrium: The atrium was mildly dilated.  - Atrial septum: No defect or patent foramen ovale was identified.   Labs/Other Tests and Data Reviewed:    EKG:  No ECG reviewed.  Recent Labs: 06/08/2020: BUN 13; Creatinine, Ser 1.07; Hemoglobin 15.1; Platelets 205; Potassium 4.4; Sodium 140   Recent Lipid Panel Lab Results  Component  Value Date/Time   CHOL 180 05/26/2019 10:30 AM   TRIG 150 (H) 05/26/2019 10:30 AM   HDL 45 05/26/2019 10:30 AM   CHOLHDL 4.0 05/26/2019 10:30 AM   CHOLHDL 4 12/14/2015 12:05 PM   LDLCALC 109 (H) 05/26/2019 10:30 AM   LDLDIRECT 143.7 08/20/2012 09:55 AM    Wt Readings from Last 3 Encounters:  06/13/20 175 lb (79.4 kg)  06/08/20 179 lb 9.6 oz (81.5 kg)  06/27/19 177 lb 3.2 oz (80.4 kg)     Risk Assessment/Calculations:    CHA2DS2-VASc Score = 5  This indicates a 7.2% annual risk of stroke. The patient's score is based upon: CHF History: No HTN History: Yes Diabetes History: No Stroke  History: Yes Vascular Disease History: Yes Age Score: 1 Gender Score: 0     Objective:    Vital Signs:  BP 135/70   Ht 5\' 9"  (1.753 m)   Wt 175 lb (79.4 kg)   BMI 25.84 kg/m    VITAL SIGNS:  reviewed GEN:  no acute distress RESPIRATORY:  normal respiratory effort, symmetric expansion NEURO:  alert and oriented x 3, no obvious focal deficit PSYCH:  normal affect  ASSESSMENT & PLAN:    Paroxysmal atrial fibrillation - On Multaq - Has seen EP and is currently planning for ablation next month   Chronic anticoagulation This patients CHA2DS2-VASc Score and unadjusted Ischemic Stroke Rate (% per year) is equal to 7.2 % stroke rate/year from a score of 5 (age, 2CVA, HTN, CAD) - will likely continue eliquis indefinitely - no problems with bleeing   Nonobstructive CAD - by CT coronary - continue risk factor modification   Hypertension - toprol, controlled   Hyperlipidemia with LDL goal < 70 - on 20 mg crestor and zetia - LDL 12/2019 was 74 - we had a long discussion about risk factors and CAD --> he would like increase crestor to 40 mg to see if he can continue to lower his LDL and I agree with this plan - he will repeat labs in 6-8 weeks with LFTs         COVID-19 Education: The signs and symptoms of COVID-19 were discussed with the patient and how to seek care for  testing (follow up with PCP or arrange E-visit).  The importance of social distancing was discussed today.  Time:   Today, I have spent 29 minutes with the patient with telehealth technology discussing the above problems.     Medication Adjustments/Labs and Tests Ordered: Current medicines are reviewed at length with the patient today.  Concerns regarding medicines are outlined above.   Tests Ordered: No orders of the defined types were placed in this encounter.   Medication Changes: No orders of the defined types were placed in this encounter.   Follow Up:  In Person in 1 year(s)  Signed, Ledora Bottcher, [place]  06/13/2020 9:37 AM    [place]

## 2020-06-13 ENCOUNTER — Telehealth (INDEPENDENT_AMBULATORY_CARE_PROVIDER_SITE_OTHER): Payer: Medicare Other | Admitting: Physician Assistant

## 2020-06-13 ENCOUNTER — Encounter: Payer: Self-pay | Admitting: Physician Assistant

## 2020-06-13 VITALS — BP 135/70 | Ht 69.0 in | Wt 175.0 lb

## 2020-06-13 DIAGNOSIS — I4891 Unspecified atrial fibrillation: Secondary | ICD-10-CM

## 2020-06-13 DIAGNOSIS — Z8673 Personal history of transient ischemic attack (TIA), and cerebral infarction without residual deficits: Secondary | ICD-10-CM | POA: Diagnosis not present

## 2020-06-13 DIAGNOSIS — I2510 Atherosclerotic heart disease of native coronary artery without angina pectoris: Secondary | ICD-10-CM

## 2020-06-13 DIAGNOSIS — E785 Hyperlipidemia, unspecified: Secondary | ICD-10-CM

## 2020-06-13 DIAGNOSIS — Z7901 Long term (current) use of anticoagulants: Secondary | ICD-10-CM | POA: Diagnosis not present

## 2020-06-13 MED ORDER — ROSUVASTATIN CALCIUM 40 MG PO TABS: 40.0000 mg | ORAL_TABLET | Freq: Every day | ORAL | 3 refills | Status: DC

## 2020-06-13 NOTE — Progress Notes (Signed)
TY

## 2020-06-13 NOTE — Patient Instructions (Addendum)
Medication Instructions:  Start Crestor 40 mg (1 Tablet Daily) *If you need a refill on your cardiac medications before your next appointment, please call your pharmacy*   Lab Work: Lipid Panel, Hepatic Function Panel( To be done Week of March 9th) If you have labs (blood work) drawn today and your tests are completely normal, you will receive your results only by: Marland Kitchen MyChart Message (if you have MyChart) OR . A paper copy in the mail If you have any lab test that is abnormal or we need to change your treatment, we will call you to review the results.   Testing/Procedures: No Testing   Follow-Up: At [facility], you and your health needs are our priority.  As part of our continuing mission to provide you with exceptional heart care, we have created designated Provider Care Teams.  These Care Teams include your primary Cardiologist (physician) and Advanced Practice Providers (APPs -  Physician Assistants and Nurse Practitioners) who all work together to provide you with the care you need, when you need it.    Your next appointment:   1 year(s)  The format for your next appointment:   In Person  Provider:   Sanda Klein, MD

## 2020-06-15 LAB — CUP PACEART REMOTE DEVICE CHECK
Date Time Interrogation Session: 20220126234223
Implantable Pulse Generator Implant Date: 20180703

## 2020-06-18 ENCOUNTER — Ambulatory Visit (INDEPENDENT_AMBULATORY_CARE_PROVIDER_SITE_OTHER): Payer: Medicare Other

## 2020-06-18 DIAGNOSIS — Z8673 Personal history of transient ischemic attack (TIA), and cerebral infarction without residual deficits: Secondary | ICD-10-CM

## 2020-06-19 DIAGNOSIS — E236 Other disorders of pituitary gland: Secondary | ICD-10-CM | POA: Diagnosis not present

## 2020-06-22 ENCOUNTER — Telehealth (HOSPITAL_COMMUNITY): Payer: Self-pay | Admitting: Emergency Medicine

## 2020-06-22 NOTE — Telephone Encounter (Signed)
Pt returning phone call regarding upcoming cardiac imaging study; pt verbalizes understanding of appt date/time, parking situation and where to check in, pre-test NPO status and medications ordered, and verified current allergies; name and call back number provided for further questions should they arise Kenwood Rosiak RN Navigator Cardiac Imaging [facility] [phone] office [phone] cell   

## 2020-06-22 NOTE — Telephone Encounter (Signed)
Attempted to call patient regarding upcoming cardiac CT appointment. °Left message on voicemail with name and callback number °Shaletha Humble RN Navigator Cardiac Imaging °[facility] °[phone] Office °[phone] Cell ° °

## 2020-06-26 ENCOUNTER — Ambulatory Visit (HOSPITAL_COMMUNITY)
Admission: RE | Admit: 2020-06-26 | Discharge: 2020-06-26 | Disposition: A | Discharge status: Home / Self Care | Payer: Medicare Other | Source: Ambulatory Visit | Attending: Internal Medicine | Admitting: Internal Medicine

## 2020-06-26 ENCOUNTER — Other Ambulatory Visit: Payer: Self-pay

## 2020-06-26 ENCOUNTER — Encounter (HOSPITAL_COMMUNITY): Payer: Self-pay

## 2020-06-26 DIAGNOSIS — I4891 Unspecified atrial fibrillation: Secondary | ICD-10-CM | POA: Diagnosis not present

## 2020-06-26 MED ORDER — IOHEXOL 350 MG/ML SOLN
80.0000 mL | Freq: Once | INTRAVENOUS | Status: AC | PRN
  Administered 2020-06-26: 80 mL via INTRAVENOUS

## 2020-06-27 NOTE — Progress Notes (Signed)
Carelink Summary Report / Loop Recorder 

## 2020-07-02 ENCOUNTER — Other Ambulatory Visit (HOSPITAL_COMMUNITY)
Admission: RE | Admit: 2020-07-02 | Discharge: 2020-07-02 | Disposition: A | Discharge status: Home / Self Care | Payer: Medicare Other | Source: Ambulatory Visit | Attending: Internal Medicine | Admitting: Internal Medicine

## 2020-07-02 DIAGNOSIS — Z01812 Encounter for preprocedural laboratory examination: Secondary | ICD-10-CM | POA: Insufficient documentation

## 2020-07-02 DIAGNOSIS — Z20822 Contact with and (suspected) exposure to covid-19: Secondary | ICD-10-CM | POA: Insufficient documentation

## 2020-07-02 LAB — SARS CORONAVIRUS 2 (TAT 6-24 HRS): SARS Coronavirus 2: NEGATIVE

## 2020-07-02 NOTE — Progress Notes (Signed)
Attempted to call patient regarding procedure instructions for tomorrow's procedure.  Left voice mail on the following items: Arrival time 0530 Nothing to eat or drink after midnight No meds AM of procedure Responsible person to drive you home and stay with you for 24 hrs  Have you missed any doses of anti-coagulant on Eliquis

## 2020-07-03 ENCOUNTER — Ambulatory Visit (HOSPITAL_COMMUNITY)
Admission: RE | Admit: 2020-07-03 | Discharge: 2020-07-03 | Disposition: A | Discharge status: Home / Self Care | Payer: Medicare Other | Attending: Internal Medicine | Admitting: Internal Medicine

## 2020-07-03 ENCOUNTER — Ambulatory Visit (HOSPITAL_COMMUNITY): Payer: Medicare Other | Admitting: Certified Registered"

## 2020-07-03 ENCOUNTER — Encounter (HOSPITAL_COMMUNITY)
Admission: RE | Disposition: A | Discharge status: Home / Self Care | Payer: Medicare Other | Source: Home / Self Care | Attending: Internal Medicine

## 2020-07-03 ENCOUNTER — Other Ambulatory Visit: Payer: Self-pay

## 2020-07-03 DIAGNOSIS — I10 Essential (primary) hypertension: Secondary | ICD-10-CM | POA: Diagnosis not present

## 2020-07-03 DIAGNOSIS — Z8673 Personal history of transient ischemic attack (TIA), and cerebral infarction without residual deficits: Secondary | ICD-10-CM | POA: Insufficient documentation

## 2020-07-03 DIAGNOSIS — Z7901 Long term (current) use of anticoagulants: Secondary | ICD-10-CM | POA: Insufficient documentation

## 2020-07-03 DIAGNOSIS — E785 Hyperlipidemia, unspecified: Secondary | ICD-10-CM | POA: Insufficient documentation

## 2020-07-03 DIAGNOSIS — K219 Gastro-esophageal reflux disease without esophagitis: Secondary | ICD-10-CM | POA: Diagnosis not present

## 2020-07-03 DIAGNOSIS — I480 Paroxysmal atrial fibrillation: Secondary | ICD-10-CM | POA: Diagnosis not present

## 2020-07-03 DIAGNOSIS — Z79899 Other long term (current) drug therapy: Secondary | ICD-10-CM | POA: Insufficient documentation

## 2020-07-03 DIAGNOSIS — I2510 Atherosclerotic heart disease of native coronary artery without angina pectoris: Secondary | ICD-10-CM | POA: Diagnosis not present

## 2020-07-03 HISTORY — PX: ATRIAL FIBRILLATION ABLATION: EP1191

## 2020-07-03 LAB — POCT ACTIVATED CLOTTING TIME: Activated Clotting Time: 309 seconds

## 2020-07-03 SURGERY — ATRIAL FIBRILLATION ABLATION
Anesthesia: General

## 2020-07-03 MED ORDER — PANTOPRAZOLE SODIUM 40 MG IV SOLR
INTRAVENOUS | Status: DC | PRN
  Administered 2020-07-03: 40 mg via INTRAVENOUS

## 2020-07-03 MED ORDER — HYDROCODONE-ACETAMINOPHEN 5-325 MG PO TABS: 1.0000 | ORAL_TABLET | ORAL | Status: DC | PRN

## 2020-07-03 MED ORDER — SODIUM CHLORIDE 0.9% FLUSH: 3.0000 mL | Freq: Two times a day (BID) | INTRAVENOUS | Status: DC

## 2020-07-03 MED ORDER — LIDOCAINE 2% (20 MG/ML) 5 ML SYRINGE
INTRAMUSCULAR | Status: DC | PRN
  Administered 2020-07-03: 60 mg via INTRAVENOUS

## 2020-07-03 MED ORDER — HEPARIN SODIUM (PORCINE) 1000 UNIT/ML IJ SOLN
INTRAMUSCULAR | Status: DC | PRN
  Administered 2020-07-03: 1000 [IU] via INTRAVENOUS
  Administered 2020-07-03: 15000 [IU] via INTRAVENOUS

## 2020-07-03 MED ORDER — ROCURONIUM BROMIDE 10 MG/ML (PF) SYRINGE
PREFILLED_SYRINGE | INTRAVENOUS | Status: DC | PRN
  Administered 2020-07-03: 70 mg via INTRAVENOUS

## 2020-07-03 MED ORDER — ONDANSETRON HCL 4 MG/2ML IJ SOLN: 4.0000 mg | Freq: Four times a day (QID) | INTRAMUSCULAR | Status: DC | PRN

## 2020-07-03 MED ORDER — HEPARIN (PORCINE) IN NACL 1000-0.9 UT/500ML-% IV SOLN
INTRAVENOUS | Status: AC
  Filled 2020-07-03: qty 500

## 2020-07-03 MED ORDER — SODIUM CHLORIDE 0.9 % IV SOLN: 250.0000 mL | INTRAVENOUS | Status: DC | PRN

## 2020-07-03 MED ORDER — ACETAMINOPHEN 325 MG PO TABS: 650.0000 mg | ORAL_TABLET | ORAL | Status: DC | PRN

## 2020-07-03 MED ORDER — PROPOFOL 10 MG/ML IV BOLUS
INTRAVENOUS | Status: DC | PRN
  Administered 2020-07-03: 100 mg via INTRAVENOUS

## 2020-07-03 MED ORDER — SODIUM CHLORIDE 0.9% FLUSH: 3.0000 mL | INTRAVENOUS | Status: DC | PRN

## 2020-07-03 MED ORDER — ISOPROTERENOL HCL 0.2 MG/ML IJ SOLN
INTRAMUSCULAR | Status: AC
  Filled 2020-07-03: qty 5

## 2020-07-03 MED ORDER — HEPARIN SODIUM (PORCINE) 1000 UNIT/ML IJ SOLN
INTRAMUSCULAR | Status: DC | PRN
  Administered 2020-07-03: 2000 [IU] via INTRAVENOUS

## 2020-07-03 MED ORDER — PHENYLEPHRINE HCL-NACL 10-0.9 MG/250ML-% IV SOLN
INTRAVENOUS | Status: DC | PRN
  Administered 2020-07-03: 50 ug/min via INTRAVENOUS

## 2020-07-03 MED ORDER — HEPARIN (PORCINE) IN NACL 1000-0.9 UT/500ML-% IV SOLN
INTRAVENOUS | Status: DC | PRN
  Administered 2020-07-03: 500 mL

## 2020-07-03 MED ORDER — HEPARIN SODIUM (PORCINE) 1000 UNIT/ML IJ SOLN
INTRAMUSCULAR | Status: AC
  Filled 2020-07-03: qty 2

## 2020-07-03 MED ORDER — FENTANYL CITRATE (PF) 100 MCG/2ML IJ SOLN
INTRAMUSCULAR | Status: DC | PRN
  Administered 2020-07-03 (×2): 50 ug via INTRAVENOUS

## 2020-07-03 MED ORDER — APIXABAN 5 MG PO TABS
5.0000 mg | ORAL_TABLET | ORAL | Status: AC
  Administered 2020-07-03: 5 mg via ORAL
  Filled 2020-07-03: qty 1

## 2020-07-03 MED ORDER — DEXAMETHASONE SODIUM PHOSPHATE 10 MG/ML IJ SOLN
INTRAMUSCULAR | Status: DC | PRN
  Administered 2020-07-03: 10 mg via INTRAVENOUS

## 2020-07-03 MED ORDER — SODIUM CHLORIDE 0.9 % IV SOLN: INTRAVENOUS | Status: DC

## 2020-07-03 MED ORDER — SUGAMMADEX SODIUM 200 MG/2ML IV SOLN
INTRAVENOUS | Status: DC | PRN
  Administered 2020-07-03: 180 mg via INTRAVENOUS

## 2020-07-03 MED ORDER — MIDAZOLAM HCL 5 MG/5ML IJ SOLN
INTRAMUSCULAR | Status: DC | PRN
  Administered 2020-07-03: 2 mg via INTRAVENOUS

## 2020-07-03 MED ORDER — ONDANSETRON HCL 4 MG/2ML IJ SOLN
INTRAMUSCULAR | Status: DC | PRN
  Administered 2020-07-03: 4 mg via INTRAVENOUS

## 2020-07-03 MED ORDER — ISOPROTERENOL HCL 0.2 MG/ML IJ SOLN
INTRAVENOUS | Status: DC | PRN
  Administered 2020-07-03: 20 ug/min via INTRAVENOUS

## 2020-07-03 SURGICAL SUPPLY — 19 items
BLANKET WARM UNDERBOD FULL ACC (MISCELLANEOUS) ×2 IMPLANT
CATH MAPPNG PENTARAY F 2-6-2MM (CATHETERS) IMPLANT
CATH SMTCH THERMOCOOL SF DF (CATHETERS) ×1 IMPLANT
CATH SOUNDSTAR ECO 8FR (CATHETERS) ×1 IMPLANT
CATH WEBSTER BI DIR CS D-F CRV (CATHETERS) ×1 IMPLANT
CLOSURE PERCLOSE PROSTYLE (VASCULAR PRODUCTS) ×3 IMPLANT
COVER SWIFTLINK CONNECTOR (BAG) ×2 IMPLANT
NDL BAYLIS TRANSSEPTAL 71CM (NEEDLE) IMPLANT
NEEDLE BAYLIS TRANSSEPTAL 71CM (NEEDLE) ×2 IMPLANT
PACK EP LATEX FREE (CUSTOM PROCEDURE TRAY) ×2
PACK EP LF (CUSTOM PROCEDURE TRAY) ×1 IMPLANT
PAD PRO RADIOLUCENT 2001M-C (PAD) ×2 IMPLANT
PATCH CARTO3 (PAD) ×1 IMPLANT
PENTARAY F 2-6-2MM (CATHETERS) ×2
SHEATH PINNACLE 7F 10CM (SHEATH) ×2 IMPLANT
SHEATH PINNACLE 9F 10CM (SHEATH) ×1 IMPLANT
SHEATH PROBE COVER 6X72 (BAG) ×1 IMPLANT
SHEATH SWARTZ TS SL2 63CM 8.5F (SHEATH) ×1 IMPLANT
TUBING SMART ABLATE COOLFLOW (TUBING) ×1 IMPLANT

## 2020-07-03 NOTE — Interval H&P Note (Signed)
History and Physical Interval Note:  07/03/2020 7:03 AM  Michael Lambert  has presented today for surgery, with the diagnosis of AFIB.  The various methods of treatment have been discussed with the patient and family. After consideration of risks, benefits and other options for treatment, the patient has consented to  Procedure(s): ATRIAL FIBRILLATION ABLATION (N/A) as a surgical intervention.  The patient's history has been reviewed, patient examined, no change in status, stable for surgery.  I have reviewed the patient's chart and labs.  Questions were answered to the patient's satisfaction.     He reports compliance with eliquis without interruption.  Cardiac CT reviewed with him at length. Risk, benefits, and alternatives to EP study and radiofrequency ablation for afib were also discussed in detail today. These risks include but are not limited to stroke, bleeding, vascular damage, tamponade, perforation, damage to the esophagus, lungs, and other structures, pulmonary vein stenosis, worsening renal function, and death. The patient understands these risk and wishes to proceed.    Thompson Grayer

## 2020-07-03 NOTE — Anesthesia Procedure Notes (Addendum)
Procedure Name: Intubation Date/Time: 07/03/2020 8:02 AM Performed by: Cleda Daub, CRNA Pre-anesthesia Checklist: Patient identified, Emergency Drugs available, Suction available and Patient being monitored Patient Re-evaluated:Patient Re-evaluated prior to induction Oxygen Delivery Method: Circle system utilized Preoxygenation: Pre-oxygenation with 100% oxygen Induction Type: IV induction Ventilation: Mask ventilation without difficulty Laryngoscope Size: Glidescope and 3 Grade View: Grade I Tube type: Oral Tube size: 7.5 mm Number of attempts: 3 Airway Equipment and Method: Stylet and Video-laryngoscopy Placement Confirmation: ETT inserted through vocal cords under direct vision,  positive ETCO2,  CO2 detector and breath sounds checked- equal and bilateral Secured at: 23 cm Tube secured with: Tape Dental Injury: Teeth and Oropharynx as per pre-operative assessment  Difficulty Due To: Difficulty was unanticipated Comments: Greggory Stallion, SRNA attempted DL with Mac 4, grade 3 view.  Next attempt with [facility] 2, grade 3 view.  Masked in between with no difficulties.  Glidescope used, grade 1 view.  Anterior airway.

## 2020-07-03 NOTE — Anesthesia Preprocedure Evaluation (Signed)
Anesthesia Evaluation  Patient identified by MRN, date of birth, ID band Patient awake    Reviewed: Allergy & Precautions, H&P , NPO status , Patient's Chart, lab work & pertinent test results  Airway Mallampati: II   Neck ROM: full    Dental   Pulmonary former smoker,    breath sounds clear to auscultation       Cardiovascular hypertension, + dysrhythmias Atrial Fibrillation  Rhythm:regular Rate:Normal     Neuro/Psych  Headaches, PSYCHIATRIC DISORDERS Anxiety TIA   GI/Hepatic GERD  ,  Endo/Other    Renal/GU      Musculoskeletal   Abdominal   Peds  Hematology   Anesthesia Other Findings   Reproductive/Obstetrics                             Anesthesia Physical Anesthesia Plan  ASA: III  Anesthesia Plan: General   Post-op Pain Management:    Induction: Intravenous  PONV Risk Score and Plan: 2 and Ondansetron, Dexamethasone, Treatment may vary due to age or medical condition and Midazolam  Airway Management Planned: Oral ETT  Additional Equipment:   Intra-op Plan:   Post-operative Plan: Extubation in OR  Informed Consent: I have reviewed the patients History and Physical, chart, labs and discussed the procedure including the risks, benefits and alternatives for the proposed anesthesia with the patient or authorized representative who has indicated his/her understanding and acceptance.     Dental advisory given  Plan Discussed with: CRNA, Anesthesiologist and Surgeon  Anesthesia Plan Comments:         Anesthesia Quick Evaluation

## 2020-07-03 NOTE — Discharge Instructions (Signed)

## 2020-07-03 NOTE — Anesthesia Postprocedure Evaluation (Signed)
Anesthesia Post Note  Patient: Michael Lambert  Procedure(s) Performed: ATRIAL FIBRILLATION ABLATION (N/A )     Patient location during evaluation: PACU Anesthesia Type: General Level of consciousness: awake and alert Pain management: pain level controlled Vital Signs Assessment: post-procedure vital signs reviewed and stable Respiratory status: spontaneous breathing, nonlabored ventilation, respiratory function stable and patient connected to nasal cannula oxygen Cardiovascular status: blood pressure returned to baseline and stable Postop Assessment: no apparent nausea or vomiting Anesthetic complications: no   No complications documented.  Last Vitals:  Vitals:   07/03/20 1200 07/03/20 1300  BP: (!) 110/59 101/70  Pulse: (!) 59 68  Resp: 14 12  Temp:    SpO2: 95% 95%    Last Pain:  Vitals:   07/03/20 1100  TempSrc:   PainSc: 0-No pain                 Eoghan Belcher S

## 2020-07-03 NOTE — Transfer of Care (Signed)
Immediate Anesthesia Transfer of Care Note  Patient: Michael Lambert  Procedure(s) Performed: ATRIAL FIBRILLATION ABLATION (N/A )  Patient Location: PACU  Anesthesia Type:General  Level of Consciousness: awake, alert , oriented and patient cooperative  Airway & Oxygen Therapy: Patient Spontanous Breathing and Patient connected to nasal cannula oxygen  Post-op Assessment: Report given to RN and Post -op Vital signs reviewed and stable  Post vital signs: Reviewed and stable  Last Vitals:  Vitals Value Taken Time  BP 130/74 07/03/20 0943  Temp    Pulse 66 07/03/20 0946  Resp 18 07/03/20 0946  SpO2 97 % 07/03/20 0946  Vitals shown include unvalidated device data.  Last Pain:  Vitals:   07/03/20 0633  TempSrc:   PainSc: 0-No pain         Complications: No complications documented.

## 2020-07-04 ENCOUNTER — Other Ambulatory Visit: Payer: Self-pay | Admitting: *Deleted

## 2020-07-04 ENCOUNTER — Encounter (HOSPITAL_COMMUNITY): Payer: Self-pay | Admitting: Internal Medicine

## 2020-07-04 MED ORDER — METOPROLOL SUCCINATE ER 25 MG PO TB24: 37.5000 mg | ORAL_TABLET | Freq: Every day | ORAL | 11 refills | Status: AC

## 2020-07-09 ENCOUNTER — Other Ambulatory Visit: Payer: Self-pay

## 2020-07-09 ENCOUNTER — Ambulatory Visit (HOSPITAL_COMMUNITY): Payer: Medicare Other | Attending: Cardiovascular Disease

## 2020-07-09 DIAGNOSIS — I480 Paroxysmal atrial fibrillation: Secondary | ICD-10-CM | POA: Diagnosis not present

## 2020-07-09 LAB — ECHOCARDIOGRAM COMPLETE
Area-P 1/2: 3.53 cm2
S' Lateral: 2.3 cm

## 2020-07-23 ENCOUNTER — Ambulatory Visit (INDEPENDENT_AMBULATORY_CARE_PROVIDER_SITE_OTHER): Payer: Medicare Other

## 2020-07-23 DIAGNOSIS — Z8673 Personal history of transient ischemic attack (TIA), and cerebral infarction without residual deficits: Secondary | ICD-10-CM

## 2020-07-25 LAB — CUP PACEART REMOTE DEVICE CHECK
Date Time Interrogation Session: 20220228235327
Implantable Pulse Generator Implant Date: 20180703

## 2020-07-31 ENCOUNTER — Ambulatory Visit (HOSPITAL_COMMUNITY)
Admission: RE | Admit: 2020-07-31 | Discharge: 2020-07-31 | Disposition: A | Discharge status: Home / Self Care | Payer: Medicare Other | Source: Ambulatory Visit | Attending: Physician Assistant | Admitting: Physician Assistant

## 2020-07-31 ENCOUNTER — Encounter (HOSPITAL_COMMUNITY): Payer: Self-pay | Admitting: Physician Assistant

## 2020-07-31 ENCOUNTER — Other Ambulatory Visit: Payer: Self-pay

## 2020-07-31 VITALS — BP 124/70 | HR 65 | Ht 69.0 in | Wt 175.4 lb

## 2020-07-31 DIAGNOSIS — Z8673 Personal history of transient ischemic attack (TIA), and cerebral infarction without residual deficits: Secondary | ICD-10-CM | POA: Diagnosis not present

## 2020-07-31 DIAGNOSIS — Z7901 Long term (current) use of anticoagulants: Secondary | ICD-10-CM | POA: Insufficient documentation

## 2020-07-31 DIAGNOSIS — I10 Essential (primary) hypertension: Secondary | ICD-10-CM | POA: Insufficient documentation

## 2020-07-31 DIAGNOSIS — Z87891 Personal history of nicotine dependence: Secondary | ICD-10-CM | POA: Diagnosis not present

## 2020-07-31 DIAGNOSIS — I480 Paroxysmal atrial fibrillation: Secondary | ICD-10-CM | POA: Diagnosis not present

## 2020-07-31 DIAGNOSIS — Z79899 Other long term (current) drug therapy: Secondary | ICD-10-CM | POA: Insufficient documentation

## 2020-07-31 DIAGNOSIS — D6869 Other thrombophilia: Secondary | ICD-10-CM | POA: Diagnosis not present

## 2020-07-31 LAB — LIPID PANEL
Cholesterol: 139 mg/dL (ref 0–200)
HDL: 49 mg/dL (ref >40)
LDL Cholesterol: 60 mg/dL (ref 0–99)
Total CHOL/HDL Ratio: 2.8 RATIO
Triglycerides: 151 mg/dL — ABNORMAL HIGH (ref <150)
VLDL: 30 mg/dL (ref 0–40)

## 2020-07-31 LAB — HEPATIC FUNCTION PANEL
ALT: 27 U/L (ref 0–44)
AST: 21 U/L (ref 15–41)
Albumin: 4.4 g/dL (ref 3.5–5.0)
Alkaline Phosphatase: 51 U/L (ref 38–126)
Bilirubin, Direct: 0.1 mg/dL (ref 0.0–0.2)
Indirect Bilirubin: 0.9 mg/dL (ref 0.3–0.9)
Total Bilirubin: 1 mg/dL (ref 0.3–1.2)
Total Protein: 7 g/dL (ref 6.5–8.1)

## 2020-07-31 NOTE — Progress Notes (Signed)
Primary Care Physician: Gaynelle Arabian, MD Primary Cardiologist: Dr Sallyanne Kuster Primary Electrophysiologist: Dr Rayann Heman Referring Physician: Dr Orene Desanctis is a 65 y.o. male with a history of HLD, prior TIA, HTN, and atrial fibrillation who presents for follow up in the [facility].  The patient was initially diagnosed with atrial fibrillation on ILR implanted for TIA in 2018. He was maintained on Multaq but had adverse effects of fatigue. Patient is on Eliquis for a CHADS2VASC score of 5. He underwent afib ablation with Dr Rayann Heman 07/03/20. He reports that he has done well since the procedure with no CP, swallowing pain, or groin issues. He has had 2 brief episodes of afib while biking.   Today, he denies symptoms of palpitations, chest pain, shortness of breath, orthopnea, PND, lower extremity edema, dizziness, presyncope, syncope, snoring, daytime somnolence, bleeding, or neurologic sequela. The patient is tolerating medications without difficulties and is otherwise without complaint today.    Atrial Fibrillation Risk Factors:  he does not have symptoms or diagnosis of sleep apnea. he does not have a history of rheumatic fever. he does not have a history of alcohol use. The patient does not have a history of early familial atrial fibrillation or other arrhythmias.  he has a BMI of Body mass index is 25.9 kg/m.Marland Kitchen Filed Weights   07/31/20 0938  Weight: 79.6 kg    Family History  Problem Relation Age of Onset  . Multiple sclerosis Other        family hx  . Hyperlipidemia Father   . Ovarian cancer Mother   . Colon cancer Neg Hx   . Esophageal cancer Neg Hx   . Rectal cancer Neg Hx   . Stomach cancer Neg Hx   . Colon polyps Neg Hx      Atrial Fibrillation Management history:  Previous antiarrhythmic drugs: Multaq Previous cardioversions: none Previous ablations: 07/03/20 CHADS2VASC score: 5 Anticoagulation history: Eliquis   Past  Medical History:  Diagnosis Date  . Anxiety    sees Dr. Pearson Grippe   . Atrial fibrillation ([place])    on Xarelto daily  . Benign prostatic hypertrophy   . GERD (gastroesophageal reflux disease)   . Hyperlipidemia   . Migraine   . Stroke [facility])    TIA memorial day 2018   Past Surgical History:  Procedure Laterality Date  . APPENDECTOMY    . ATRIAL FIBRILLATION ABLATION N/A 07/03/2020   Procedure: ATRIAL FIBRILLATION ABLATION;  Surgeon: Thompson Grayer, MD;  Location: [place] CV [facility];  Service: Cardiovascular;  Laterality: N/A;  . colonoscopy  10-08-12   per Dr. Deatra Ina, adenomatous polyps, repeat in 5 yrs   . COLONOSCOPY    . CYSTOSCOPY     per Dr. Rosana Hoes-   . gum graft  08/2009   Dr Royston Cowper   . HERNIA REPAIR  2008   bilateral inguinal, per Dr. Zella Richer  . LOOP RECORDER INSERTION N/A 11/18/2016   Procedure: Loop Recorder Insertion;  Surgeon: Thompson Grayer, MD;  Location: [place] CV LAB;  Service: Cardiovascular;  Laterality: N/A;  . PALATE / UVULA BIOPSY / EXCISION  2004   per Dr. Lucia Gaskins, for snoring   . RADIAL KERATOTOMY     sees Dr. Mercer Pod as FU  at [facility] , bilateral,  dr stonecipher did the RK   . repair detached retina    . TEE WITHOUT CARDIOVERSION N/A 11/18/2016   Procedure: TRANSESOPHAGEAL ECHOCARDIOGRAM (TEE);  Surgeon: Thayer Headings,  MD;  Location: [place] ENDOSCOPY;  Service: Cardiovascular;  Laterality: N/A;  . TONSILLECTOMY      Current Outpatient Medications  Medication Sig Dispense Refill  . acetaminophen (TYLENOL) 500 MG tablet Take 500 mg by mouth every 6 (six) hours as needed for mild pain or moderate pain.    . cholecalciferol (VITAMIN D) 1000 units tablet Take 1,000 Units by mouth daily.    Marland Kitchen ELIQUIS 5 MG TABS tablet TAKE ONE TABLET BY MOUTH TWICE A DAY 60 tablet 4  . esomeprazole (NEXIUM) 20 MG capsule Take 20 mg by mouth daily at 12 noon.    . ezetimibe (ZETIA) 10 MG tablet TAKE ONE TABLET BY MOUTH DAILY 90 tablet 0  . latanoprost  (XALATAN) 0.005 % ophthalmic solution Place 1 drop into both eyes at bedtime.    . metoprolol succinate (TOPROL-XL) 25 MG 24 hr tablet Take 1.5 tablets (37.5 mg total) by mouth daily. 45 tablet 11  . rosuvastatin (CRESTOR) 40 MG tablet Take 1 tablet (40 mg total) by mouth daily. 90 tablet 3  . sertraline (ZOLOFT) 25 MG tablet Take 25 mg by mouth daily.     No current facility-administered medications for this encounter.    No Known Allergies  Social History   Socioeconomic History  . Marital status: Married    Spouse name: Not on file  . Number of children: Not on file  . Years of education: Not on file  . Highest education level: Not on file  Occupational History  . Not on file  Tobacco Use  . Smoking status: Former Smoker    Quit date: 05/19/1980    Years since quitting: 40.2  . Smokeless tobacco: Never Used  Vaping Use  . Vaping Use: Never used  Substance and Sexual Activity  . Alcohol use: Yes    Alcohol/week: 2.0 standard drinks    Types: 1 Glasses of wine, 1 Cans of beer per week    Comment: 2 glasses of wine each day  . Drug use: No  . Sexual activity: Not on file  Other Topics Concern  . Not on file  Social History Narrative  . Not on file   Social Determinants of Health   Financial Resource Strain: Not on file  Food Insecurity: Not on file  Transportation Needs: Not on file  Physical Activity: Not on file  Stress: Not on file  Social Connections: Not on file  Intimate Partner Violence: Not on file     ROS- All systems are reviewed and negative except as per the HPI above.  Physical Exam: Vitals:   07/31/20 0938  BP: 124/70  Pulse: 65  Weight: 79.6 kg  Height: 5\' 9"  (1.753 m)    GEN- The patient is a well appearing male, alert and oriented x 3 today.   Head- normocephalic, atraumatic Eyes-  Sclera clear, conjunctiva pink Ears- hearing intact Oropharynx- clear Neck- supple  Lungs- Clear to ausculation bilaterally, normal work of  breathing Heart- Regular rate and rhythm, no murmurs, rubs or gallops  GI- soft, NT, ND, + BS Extremities- no clubbing, cyanosis, or edema MS- no significant deformity or atrophy Skin- no rash or lesion Psych- euthymic mood, full affect Neuro- strength and sensation are intact  Wt Readings from Last 3 Encounters:  07/31/20 79.6 kg  07/03/20 79.4 kg  06/13/20 79.4 kg    EKG today demonstrates  SR Vent. rate 65 BPM PR interval 156 ms QRS duration 80 ms QT/QTc 420/436 ms  Echo 07/09/20 demonstrated  1. Left ventricular ejection fraction, by estimation, is 60 to 65%. Left  ventricular ejection fraction by 3D volume is 61 %. The left ventricle has  normal function. The left ventricle has no regional wall motion  abnormalities. Left ventricular diastolic  parameters were normal. The average left ventricular global longitudinal  strain is -18.9 %. The global longitudinal strain is normal.  2. Right ventricular systolic function is normal. The right ventricular  size is normal. There is normal pulmonary artery systolic pressure. The  estimated right ventricular systolic pressure is 97.9 mmHg.  3. The mitral valve is grossly normal. Mild mitral valve regurgitation.  No evidence of mitral stenosis.  4. The aortic valve is tricuspid. There is mild calcification of the  aortic valve. There is mild thickening of the aortic valve. Aortic valve  regurgitation is not visualized. No aortic stenosis is present.  5. The inferior vena cava is normal in size with greater than 50%  respiratory variability, suggesting right atrial pressure of 3 mmHg.  Epic records are reviewed at length today  CHA2DS2-VASc Score = 5  The patient's score is based upon: CHF History: No HTN History: Yes Diabetes History: No Stroke History: Yes Vascular Disease History: Yes Age Score: 1 Gender Score: 0      ASSESSMENT AND PLAN: 1. Paroxysmal Atrial Fibrillation (ICD10:  I48.0) The patient's  CHA2DS2-VASc score is 5, indicating a 7.2% annual risk of stroke.   S/p afib ablation with Dr Rayann Heman 07/03/20 ILR shows 0.1% afib burden  Continue Eliquis 5 mg BID with no missed doses for at least 3 months post ablation.  Continue Toprol 37.5 mg daily  2. Secondary Hypercoagulable State (ICD10:  D68.69) The patient is at significant risk for stroke/thromboembolism based upon his CHA2DS2-VASc Score of 5.  Continue Apixaban (Eliquis).   3. HTN Stable, no changes today.   Follow up with Dr Rayann Heman as scheduled.    [facility] [address] [place], [place] [address] [phone] 07/31/2020 9:48 AM

## 2020-08-01 NOTE — Progress Notes (Signed)
Carelink Summary Report / Loop Recorder 

## 2020-08-17 ENCOUNTER — Ambulatory Visit: Payer: Medicare Other | Admitting: Cardiovascular Disease

## 2020-08-21 ENCOUNTER — Other Ambulatory Visit: Payer: Self-pay | Admitting: Cardiovascular Disease

## 2020-08-25 LAB — CUP PACEART REMOTE DEVICE CHECK
Date Time Interrogation Session: 20220403013056
Implantable Pulse Generator Implant Date: 20180703

## 2020-08-27 ENCOUNTER — Ambulatory Visit (INDEPENDENT_AMBULATORY_CARE_PROVIDER_SITE_OTHER): Payer: Medicare Other

## 2020-08-27 DIAGNOSIS — I639 Cerebral infarction, unspecified: Secondary | ICD-10-CM | POA: Diagnosis not present

## 2020-09-11 NOTE — Progress Notes (Signed)
Carelink Summary Report / Loop Recorder 

## 2020-09-18 DIAGNOSIS — H401134 Primary open-angle glaucoma, bilateral, indeterminate stage: Secondary | ICD-10-CM | POA: Diagnosis not present

## 2020-09-25 DIAGNOSIS — M25511 Pain in right shoulder: Secondary | ICD-10-CM | POA: Diagnosis not present

## 2020-10-01 ENCOUNTER — Ambulatory Visit: Payer: Medicare Other | Admitting: Internal Medicine

## 2020-10-01 ENCOUNTER — Ambulatory Visit (INDEPENDENT_AMBULATORY_CARE_PROVIDER_SITE_OTHER): Payer: Medicare Other

## 2020-10-01 DIAGNOSIS — I639 Cerebral infarction, unspecified: Secondary | ICD-10-CM | POA: Diagnosis not present

## 2020-10-01 DIAGNOSIS — I480 Paroxysmal atrial fibrillation: Secondary | ICD-10-CM

## 2020-10-02 ENCOUNTER — Telehealth: Payer: Self-pay

## 2020-10-02 ENCOUNTER — Telehealth: Payer: Medicare Other | Admitting: Internal Medicine

## 2020-10-02 ENCOUNTER — Other Ambulatory Visit: Payer: Self-pay

## 2020-10-02 ENCOUNTER — Encounter: Payer: Self-pay | Admitting: Internal Medicine

## 2020-10-02 VITALS — BP 152/80 | HR 67 | Wt 172.0 lb

## 2020-10-02 DIAGNOSIS — I480 Paroxysmal atrial fibrillation: Secondary | ICD-10-CM

## 2020-10-02 DIAGNOSIS — D6869 Other thrombophilia: Secondary | ICD-10-CM | POA: Diagnosis not present

## 2020-10-02 DIAGNOSIS — E782 Mixed hyperlipidemia: Secondary | ICD-10-CM | POA: Diagnosis not present

## 2020-10-02 NOTE — Progress Notes (Signed)
Electrophysiology TeleHealth Note   Due to national recommendations of social distancing due to COVID 19, an audio/video telehealth visit is felt to be most appropriate for this patient at this time.  See MyChart message from today for the patient's consent to telehealth for [facility].  Date:  10/02/2020   ID:  Michael Lambert, DOB March 16, 1956, MRN [id number]  Location: patient's home  Provider location:  [place]  Evaluation Performed: Follow-up visit  PCP:  Gaynelle Arabian, MD   Electrophysiologist:  Dr Rayann Heman  Chief Complaint:  palpitations  History of Present Illness:    Michael Lambert is a 65 y.o. male who presents via telehealth conferencing today.  Since his ablation, the patient reports doing very well.  Pleased with results.  No recent AF.  Denies procedure related complications. Today, he denies symptoms of palpitations, chest pain, shortness of breath,  lower extremity edema, dizziness, presyncope, or syncope.  The patient is otherwise without complaint today.   Past Medical History:  Diagnosis Date  . Anxiety    sees Dr. Pearson Grippe   . Atrial fibrillation ([place])    on Xarelto daily  . Benign prostatic hypertrophy   . GERD (gastroesophageal reflux disease)   . Hyperlipidemia   . Migraine   . Stroke [facility])    TIA memorial day 2018    Past Surgical History:  Procedure Laterality Date  . APPENDECTOMY    . ATRIAL FIBRILLATION ABLATION N/A 07/03/2020   Procedure: ATRIAL FIBRILLATION ABLATION;  Surgeon: Thompson Grayer, MD;  Location: [place] CV LAB;  Service: Cardiovascular;  Laterality: N/A;  . colonoscopy  10-08-12   per Dr. Deatra Ina, adenomatous polyps, repeat in 5 yrs   . COLONOSCOPY    . CYSTOSCOPY     per Dr. Rosana Hoes-   . gum graft  08/2009   Dr Royston Cowper   . HERNIA REPAIR  2008   bilateral inguinal, per Dr. Zella Richer  . LOOP RECORDER INSERTION N/A 11/18/2016   Procedure: Loop Recorder Insertion;  Surgeon: Thompson Grayer, MD;  Location: [place]  CV LAB;  Service: Cardiovascular;  Laterality: N/A;  . PALATE / UVULA BIOPSY / EXCISION  2004   per Dr. Lucia Gaskins, for snoring   . RADIAL KERATOTOMY     sees Dr. Mercer Pod as FU  at [facility] , bilateral,  dr stonecipher did the RK   . repair detached retina    . TEE WITHOUT CARDIOVERSION N/A 11/18/2016   Procedure: TRANSESOPHAGEAL ECHOCARDIOGRAM (TEE);  Surgeon: Acie Fredrickson Wonda Cheng, MD;  Location: [facility] ENDOSCOPY;  Service: Cardiovascular;  Laterality: N/A;  . TONSILLECTOMY      Current Outpatient Medications  Medication Sig Dispense Refill  . acetaminophen (TYLENOL) 500 MG tablet Take 500 mg by mouth every 6 (six) hours as needed for mild pain or moderate pain.    . cholecalciferol (VITAMIN D) 1000 units tablet Take 1,000 Units by mouth daily.    Marland Kitchen ELIQUIS 5 MG TABS tablet TAKE ONE TABLET BY MOUTH TWICE A DAY 60 tablet 4  . esomeprazole (NEXIUM) 20 MG capsule Take 20 mg by mouth daily at 12 noon.    . ezetimibe (ZETIA) 10 MG tablet TAKE ONE TABLET BY MOUTH DAILY * NEED APPOINTMENT FOR REFILLS 90 tablet 3  . latanoprost (XALATAN) 0.005 % ophthalmic solution Place 1 drop into both eyes at bedtime.    . metoprolol succinate (TOPROL-XL) 25 MG 24 hr tablet Take 1.5 tablets (37.5 mg total) by mouth daily. 45 tablet 11  .  sertraline (ZOLOFT) 25 MG tablet Take 25 mg by mouth daily.    . rosuvastatin (CRESTOR) 40 MG tablet Take 1 tablet (40 mg total) by mouth daily. 90 tablet 3   No current facility-administered medications for this visit.    Allergies:   Patient has no known allergies.   Social History:  The patient  reports that he quit smoking about 40 years ago. He has never used smokeless tobacco. He reports current alcohol use of about 2.0 standard drinks of alcohol per week. He reports that he does not use drugs.   ROS:  Please see the history of present illness.   All other systems are personally reviewed and negative.    Exam:    Vital Signs:  BP (!) 152/80   Pulse 67   Wt 172 lb (78  kg)   BMI 25.40 kg/m   Well sounding and appearing, alert and conversant, regular work of breathing,  good skin color Eyes- anicteric, neuro- grossly intact, skin- no apparent rash or lesions or cyanosis, mouth- oral mucosa is pink  Labs/Other Tests and Data Reviewed:    Recent Labs: 06/08/2020: BUN 13; Creatinine, Ser 1.07; Hemoglobin 15.1; Platelets 205; Potassium 4.4; Sodium 140 07/31/2020: ALT 27   Wt Readings from Last 3 Encounters:  10/02/20 172 lb (78 kg)  07/31/20 175 lb 6.4 oz (79.6 kg)  07/03/20 175 lb (79.4 kg)     Last device remote is reviewed from [facility] which reveals ILR shows no recent afib   ASSESSMENT & PLAN:    1.  Paroxysmal atrial fibrillation Doing very well post ablation off AAD therapy ILR shows AF is well controlled chads2vasc score is 3 (includes prior stroke).  Continue long term eliquis He wishes to wean off of metoprolol  2. Prior stroke Continue eliquis  3. HL He is on high dose crestor Lipids reviewed today   Risks, benefits and potential toxicities for medications prescribed and/or refilled reviewed with patient today.   Follow-up:  3 months with me   Patient Risk:  after full review of this patients clinical status, I feel that they are at moderate risk at this time.  Today, I have spent 15 minutes with the patient with telehealth technology discussing arrhythmia management .    Army Fossa, MD  10/02/2020 9:04 AM     [facility] [address] [place] [address] [phone] (office) [phone] (fax)'

## 2020-10-02 NOTE — Telephone Encounter (Signed)
Spoke with pt regarding appt on 10/02/20. Pt gave verbal consent for MyChart visit.

## 2020-10-03 LAB — CUP PACEART REMOTE DEVICE CHECK
Date Time Interrogation Session: 20220514231057
Implantable Pulse Generator Implant Date: 20180703

## 2020-10-16 NOTE — Telephone Encounter (Signed)
Contacted patient and advised patient that his loop recorder has met RRT as of 10/12/20. I gave patient opinions to explant or leave in place. Patient would like to discuss some other questions and concerns with Dr. Rayann Heman. Home remotes cancelled. Patient status changed in [facility] to Inactive and return kit ordered for patient. Patient to call [facility] and advised if he wants to have device explanted.

## 2020-10-17 DIAGNOSIS — H401131 Primary open-angle glaucoma, bilateral, mild stage: Secondary | ICD-10-CM | POA: Diagnosis not present

## 2020-10-18 DIAGNOSIS — L609 Nail disorder, unspecified: Secondary | ICD-10-CM | POA: Diagnosis not present

## 2020-10-18 DIAGNOSIS — M25519 Pain in unspecified shoulder: Secondary | ICD-10-CM | POA: Diagnosis not present

## 2020-10-18 DIAGNOSIS — M25511 Pain in right shoulder: Secondary | ICD-10-CM | POA: Diagnosis not present

## 2020-10-18 DIAGNOSIS — M25552 Pain in left hip: Secondary | ICD-10-CM | POA: Diagnosis not present

## 2020-10-18 DIAGNOSIS — M1712 Unilateral primary osteoarthritis, left knee: Secondary | ICD-10-CM | POA: Diagnosis not present

## 2020-10-18 DIAGNOSIS — M19011 Primary osteoarthritis, right shoulder: Secondary | ICD-10-CM | POA: Diagnosis not present

## 2020-10-18 DIAGNOSIS — M25551 Pain in right hip: Secondary | ICD-10-CM | POA: Diagnosis not present

## 2020-10-18 DIAGNOSIS — M25461 Effusion, right knee: Secondary | ICD-10-CM | POA: Diagnosis not present

## 2020-10-18 DIAGNOSIS — M533 Sacrococcygeal disorders, not elsewhere classified: Secondary | ICD-10-CM | POA: Diagnosis not present

## 2020-10-18 DIAGNOSIS — M47816 Spondylosis without myelopathy or radiculopathy, lumbar region: Secondary | ICD-10-CM | POA: Diagnosis not present

## 2020-10-18 DIAGNOSIS — M11261 Other chondrocalcinosis, right knee: Secondary | ICD-10-CM | POA: Diagnosis not present

## 2020-10-18 DIAGNOSIS — M1711 Unilateral primary osteoarthritis, right knee: Secondary | ICD-10-CM | POA: Diagnosis not present

## 2020-10-18 DIAGNOSIS — M25559 Pain in unspecified hip: Secondary | ICD-10-CM | POA: Diagnosis not present

## 2020-10-18 DIAGNOSIS — M25512 Pain in left shoulder: Secondary | ICD-10-CM | POA: Diagnosis not present

## 2020-10-18 DIAGNOSIS — M2550 Pain in unspecified joint: Secondary | ICD-10-CM | POA: Diagnosis not present

## 2020-10-18 DIAGNOSIS — M11262 Other chondrocalcinosis, left knee: Secondary | ICD-10-CM | POA: Diagnosis not present

## 2020-10-18 DIAGNOSIS — M19012 Primary osteoarthritis, left shoulder: Secondary | ICD-10-CM | POA: Diagnosis not present

## 2020-10-18 DIAGNOSIS — M25561 Pain in right knee: Secondary | ICD-10-CM | POA: Diagnosis not present

## 2020-10-18 DIAGNOSIS — M1990 Unspecified osteoarthritis, unspecified site: Secondary | ICD-10-CM | POA: Diagnosis not present

## 2020-10-24 NOTE — Progress Notes (Signed)
Carelink Summary Report / Loop Recorder 

## 2020-11-02 DIAGNOSIS — M1120 Other chondrocalcinosis, unspecified site: Secondary | ICD-10-CM | POA: Diagnosis not present

## 2020-11-02 DIAGNOSIS — M25569 Pain in unspecified knee: Secondary | ICD-10-CM | POA: Diagnosis not present

## 2020-11-02 DIAGNOSIS — M1990 Unspecified osteoarthritis, unspecified site: Secondary | ICD-10-CM | POA: Diagnosis not present

## 2020-11-02 DIAGNOSIS — M25519 Pain in unspecified shoulder: Secondary | ICD-10-CM | POA: Diagnosis not present

## 2020-11-02 DIAGNOSIS — L609 Nail disorder, unspecified: Secondary | ICD-10-CM | POA: Diagnosis not present

## 2020-11-02 DIAGNOSIS — M1189 Other specified crystal arthropathies, multiple sites: Secondary | ICD-10-CM | POA: Diagnosis not present

## 2020-11-07 DIAGNOSIS — M1711 Unilateral primary osteoarthritis, right knee: Secondary | ICD-10-CM | POA: Diagnosis not present

## 2020-11-07 DIAGNOSIS — M19012 Primary osteoarthritis, left shoulder: Secondary | ICD-10-CM | POA: Diagnosis not present

## 2020-11-08 ENCOUNTER — Other Ambulatory Visit: Payer: Self-pay | Admitting: Family Medicine

## 2020-11-08 DIAGNOSIS — M2390 Unspecified internal derangement of unspecified knee: Secondary | ICD-10-CM

## 2020-11-08 DIAGNOSIS — M1711 Unilateral primary osteoarthritis, right knee: Secondary | ICD-10-CM

## 2020-11-09 ENCOUNTER — Other Ambulatory Visit: Payer: Self-pay | Admitting: Family Medicine

## 2020-11-09 DIAGNOSIS — M1711 Unilateral primary osteoarthritis, right knee: Secondary | ICD-10-CM

## 2020-11-09 DIAGNOSIS — M2390 Unspecified internal derangement of unspecified knee: Secondary | ICD-10-CM

## 2020-11-15 ENCOUNTER — Ambulatory Visit
Admission: RE | Admit: 2020-11-15 | Discharge: 2020-11-15 | Disposition: A | Discharge status: Home / Self Care | Payer: Medicare Other | Source: Ambulatory Visit | Attending: Family Medicine | Admitting: Family Medicine

## 2020-11-15 ENCOUNTER — Other Ambulatory Visit: Payer: Self-pay

## 2020-11-15 DIAGNOSIS — M25561 Pain in right knee: Secondary | ICD-10-CM | POA: Diagnosis not present

## 2020-11-15 DIAGNOSIS — M2390 Unspecified internal derangement of unspecified knee: Secondary | ICD-10-CM

## 2020-11-15 DIAGNOSIS — M1711 Unilateral primary osteoarthritis, right knee: Secondary | ICD-10-CM

## 2020-11-17 ENCOUNTER — Other Ambulatory Visit: Payer: Self-pay | Admitting: Family Medicine

## 2020-11-17 DIAGNOSIS — M24819 Other specific joint derangements of unspecified shoulder, not elsewhere classified: Secondary | ICD-10-CM

## 2020-11-17 DIAGNOSIS — M19012 Primary osteoarthritis, left shoulder: Secondary | ICD-10-CM

## 2020-11-20 ENCOUNTER — Other Ambulatory Visit: Payer: Self-pay

## 2020-11-20 ENCOUNTER — Ambulatory Visit
Admission: RE | Admit: 2020-11-20 | Discharge: 2020-11-20 | Disposition: A | Discharge status: Home / Self Care | Payer: Medicare Other | Source: Ambulatory Visit | Attending: Family Medicine | Admitting: Family Medicine

## 2020-11-20 DIAGNOSIS — R600 Localized edema: Secondary | ICD-10-CM | POA: Diagnosis not present

## 2020-11-20 DIAGNOSIS — M19012 Primary osteoarthritis, left shoulder: Secondary | ICD-10-CM | POA: Diagnosis not present

## 2020-11-20 DIAGNOSIS — M7552 Bursitis of left shoulder: Secondary | ICD-10-CM | POA: Diagnosis not present

## 2020-11-20 DIAGNOSIS — M24819 Other specific joint derangements of unspecified shoulder, not elsewhere classified: Secondary | ICD-10-CM

## 2020-11-22 DIAGNOSIS — S83281A Other tear of lateral meniscus, current injury, right knee, initial encounter: Secondary | ICD-10-CM | POA: Diagnosis not present

## 2020-11-22 DIAGNOSIS — M25551 Pain in right hip: Secondary | ICD-10-CM | POA: Diagnosis not present

## 2020-11-22 DIAGNOSIS — M67912 Unspecified disorder of synovium and tendon, left shoulder: Secondary | ICD-10-CM | POA: Diagnosis not present

## 2020-12-06 DIAGNOSIS — M1711 Unilateral primary osteoarthritis, right knee: Secondary | ICD-10-CM | POA: Diagnosis not present

## 2020-12-06 DIAGNOSIS — M25561 Pain in right knee: Secondary | ICD-10-CM | POA: Diagnosis not present

## 2020-12-07 DIAGNOSIS — M6281 Muscle weakness (generalized): Secondary | ICD-10-CM | POA: Diagnosis not present

## 2020-12-07 DIAGNOSIS — M67912 Unspecified disorder of synovium and tendon, left shoulder: Secondary | ICD-10-CM | POA: Diagnosis not present

## 2020-12-21 DIAGNOSIS — M25512 Pain in left shoulder: Secondary | ICD-10-CM | POA: Diagnosis not present

## 2020-12-25 ENCOUNTER — Other Ambulatory Visit: Payer: Self-pay

## 2020-12-25 ENCOUNTER — Ambulatory Visit: Payer: Medicare Other | Admitting: Sports Medicine

## 2020-12-25 VITALS — BP 160/98 | Ht 69.0 in | Wt 170.0 lb

## 2020-12-25 DIAGNOSIS — M25512 Pain in left shoulder: Secondary | ICD-10-CM

## 2020-12-25 DIAGNOSIS — M1711 Unilateral primary osteoarthritis, right knee: Secondary | ICD-10-CM

## 2020-12-25 MED ORDER — CYCLOBENZAPRINE HCL 10 MG PO TABS: ORAL_TABLET | ORAL | 0 refills | Status: DC

## 2020-12-25 NOTE — Patient Instructions (Signed)
[facility]  [address] [place] [address] [phone]

## 2020-12-25 NOTE — Progress Notes (Signed)
Subjective:    Patient ID: Michael Lambert, male    DOB: Jun 21, 1955, 65 y.o.   MRN: [id number]  HPI 65 year old male presents to clinic today for second opinion on right knee pain and left shoulder pain since April.  Today, he states that his left shoulder is worse, waking him up at night due to the severe pain.  He states that he had no inciting event to his pains, that they came on gradually.  He initially saw his PCP who referred him to rheumatology.  Work-up was largely negative, per patient.  Their thought was that he had pseudogout.  He was started on steroids which significantly helped his shoulder and knee pain, he states it was like magic. He saw Dr.McKinely at [facility] who ordered MRI's of his right knee and left shoulder.  MRI left shoulder did show AC joint arthritis and rotator cuff tendinosis, however no significant tear.  MRI right knee did show meniscus tears-oblique tear of the lateral meniscus anterior horn, additional complex tear of the posterior horn, prior ACL reconstruction, patellofemoral osteoarthritis.  He states that his pain is mostly located at the posterior aspect of his left shoulder at the superior scapular border, which is new in the past week.  Previously he had his pain more at the anterior deltoid region. He does have some neck pain with side to side rotation, denies midline neck pain at rest or arm pain, radicular symptoms down his arms. He denies any weakness, numbness, tingling in his left shoulder.  He states that his pain has worsened significantly over the past week to the point that he has stopped cycling which is his main activity that he enjoys.  He states that the pain is so severe that it wakes him up at night. He has been doing physical therapy over the long-term, doing rotator cuff strengthening exercises.  He additionally complains of anterior right knee pain since April, steroids helped his pain.  He states that his pain is worst in the morning when he  wakes up, worse the next day after activity such as cycling. He denies any mechanical symptoms such as clicking, locking, catching.  Oral steroids helped his knee pain as well, he continues Voltaren gel.  Review of Systems 10 point review of systems negative except as noted above    Objective:   Physical Exam Constitutional:      Appearance: Normal appearance.  HENT:     Head: Normocephalic.  Eyes:     Extraocular Movements: Extraocular movements intact.  Neck:     Comments: Endorses neck pain with side to side rotation, Spurling's negative, denies radicular symptoms down arm Musculoskeletal:        General: No swelling or deformity.     Cervical back: Normal range of motion.     Right lower leg: No edema.     Left lower leg: No edema.     Comments: Scapulothoracic motion within normal limits, no scapular winging noted Intact passive and active range of motion glenohumeral joint bilaterally No tenderness to palpation at [facility] joint, biceps tendon, scapular border, deltoid + Tenderness to palpation at the left trapezius 5/5 strength in bilateral upper extremities, 5/5 strength in all rotator cuff muscles  Sensation to light touch intact in all dermatomes of bilateral upper extremities Negative Hawkins, Neer's, O'Briens bilaterally  Right knee: No joint effusion noted No tenderness to palpation at the patellar borders, medial, lateral, posterior joint line. Crepitus noted at anterior patella Reduced range of motion  with knee flexion, full range of motion with knee extension Intact MCL, LCL, ACL, PCL on provocative testing No pain or clicking noted with Roena Malady  Neurological:     Mental Status: He is alert.      Assessment & Plan:   65 year old male presents with left shoulder and right knee pain since April  -MRI of left shoulder reviewed, significant for [facility] joint arthritis, tendinosis of rotator cuff muscles.  Do not believe that these are contributing to his  symptoms as he is nontender at his [facility] joint, has preserved strength in his rotator cuff muscles.  He did have a shoulder injection about 1 month ago with Dr. Rip Harbour which he states did not help much. Given the pain that he has radiating into his trapezius, superior scapular border, as well as pain with neck movement from side to side, believe that his pain is originating from his cervical facet joints.  Given his difficulty sleeping, he asks about medication options.  Will trial 10 mg of Flexeril before bed for improved pain control.  If his pain fails to improve with this, we will move forward with cervical radiographs to evaluate for spondylosis.  Patient may discontinue formal physical therapy and start a home exercise program consisting of Jobe exercises and scapular stabilization.  He may also try some dry needling.  -MRI right knee reviewed-significant for patellofemoral joint OA, oblique tear of the lateral meniscus anterior horn, additional complex tear of the posterior horn, prior ACL reconstruction.  Do not believe that his meniscus tears are contributing to his symptoms, he denies any mechanical symptoms.  Given that he has most of his pain in the mornings, worse after activity, as well as significant crepitus at the patellofemoral joint-believe that his pain is secondary to anterior compartment osteoarthritis.  Encouraged him to continue using Voltaren gel as needed.  He cannot take NSAIDs, as he is currently on Eliquis. May consider visco supplementation if he develops significant pain in the anterior compartment.  Electronically signed by: Sherrell Puller MD, PGY-4 PM&R

## 2020-12-26 ENCOUNTER — Encounter: Payer: Self-pay | Admitting: Sports Medicine

## 2021-01-02 NOTE — Telephone Encounter (Signed)
Discussed general devices with the patient and advised him that Dr. Rayann Heman is aware of his concerns about NSAID's and Eliquis. Advised IF the Loop implant is an option that the old one would be removed and new one implanted on the day of his office visit.  Verbalized understanding

## 2021-01-03 ENCOUNTER — Other Ambulatory Visit: Payer: Self-pay

## 2021-01-03 MED ORDER — PREDNISONE 5 MG PO TABS: 5.0000 mg | ORAL_TABLET | Freq: Every day | ORAL | 0 refills | Status: DC

## 2021-01-07 ENCOUNTER — Ambulatory Visit: Payer: Medicare Other | Admitting: Internal Medicine

## 2021-01-07 ENCOUNTER — Other Ambulatory Visit: Payer: Self-pay

## 2021-01-07 VITALS — BP 128/84 | HR 67 | Ht 69.0 in | Wt 170.0 lb

## 2021-01-07 DIAGNOSIS — I639 Cerebral infarction, unspecified: Secondary | ICD-10-CM

## 2021-01-07 DIAGNOSIS — I480 Paroxysmal atrial fibrillation: Secondary | ICD-10-CM | POA: Diagnosis not present

## 2021-01-07 NOTE — Progress Notes (Signed)
PCP: Gaynelle Arabian, MD Primary Cardiologist: Dr Sallyanne Kuster Primary EP: Dr Orene Desanctis is a 65 y.o. male who presents today for routine electrophysiology followup.  Since last being seen in our clinic, the patient reports doing reasonably well.  He has developed pain in his shoulders and hips.  He has seen ortho and rheum.  He has been started on steroids for presumed myositis. Today, he denies symptoms of palpitations, chest pain, shortness of breath,  lower extremity edema, dizziness, presyncope, or syncope.  The patient is otherwise without complaint today.   Past Medical History:  Diagnosis Date   Anxiety    sees Dr. Pearson Grippe    Atrial fibrillation [facility])    on Xarelto daily   Benign prostatic hypertrophy    GERD (gastroesophageal reflux disease)    Hyperlipidemia    Migraine    Stroke ([place])    TIA memorial day 2018   Past Surgical History:  Procedure Laterality Date   APPENDECTOMY     ATRIAL FIBRILLATION ABLATION N/A 07/03/2020   Procedure: ATRIAL FIBRILLATION ABLATION;  Surgeon: Thompson Grayer, MD;  Location: [place] [facility];  Service: Cardiovascular;  Laterality: N/A;   colonoscopy  10-08-12   per Dr. Deatra Ina, adenomatous polyps, repeat in 5 yrs    COLONOSCOPY     CYSTOSCOPY     per Dr. Rosana Hoes-    gum graft  08/2009   Dr Royston Cowper    HERNIA REPAIR  2008   bilateral inguinal, per Dr. Zella Richer   LOOP RECORDER INSERTION N/A 11/18/2016   Procedure: Loop Recorder Insertion;  Surgeon: Thompson Grayer, MD;  Location: [place] [facility];  Service: Cardiovascular;  Laterality: N/A;   PALATE / UVULA BIOPSY / EXCISION  2004   per Dr. Lucia Gaskins, for snoring    RADIAL KERATOTOMY     sees Dr. Mercer Pod as FU  at [facility] , bilateral,  dr stonecipher did the RK    repair detached retina     TEE WITHOUT CARDIOVERSION N/A 11/18/2016   Procedure: TRANSESOPHAGEAL ECHOCARDIOGRAM (TEE);  Surgeon: Thayer Headings, MD;  Location: [facility] ENDOSCOPY;  Service: Cardiovascular;   Laterality: N/A;   TONSILLECTOMY      ROS- all systems are reviewed and negatives except as per HPI above  Current Outpatient Medications  Medication Sig Dispense Refill   acetaminophen (TYLENOL) 500 MG tablet Take 500 mg by mouth every 6 (six) hours as needed for mild pain or moderate pain.     cholecalciferol (VITAMIN D) 1000 units tablet Take 1,000 Units by mouth daily.     ELIQUIS 5 MG TABS tablet TAKE ONE TABLET BY MOUTH TWICE A DAY 60 tablet 4   esomeprazole (NEXIUM) 20 MG capsule Take 20 mg by mouth daily at 12 noon.     ezetimibe (ZETIA) 10 MG tablet TAKE ONE TABLET BY MOUTH DAILY * NEED APPOINTMENT FOR REFILLS 90 tablet 3   latanoprost (XALATAN) 0.005 % ophthalmic solution Place 1 drop into both eyes at bedtime.     predniSONE (DELTASONE) 5 MG tablet Take 1 tablet (5 mg total) by mouth at bedtime. 7 tablet 0   sertraline (ZOLOFT) 25 MG tablet Take 25 mg by mouth daily.     cyclobenzaprine (FLEXERIL) 10 MG tablet Take 1/2-1 tab at bedtime as needed. (Patient not taking: Reported on 01/07/2021) 30 tablet 0   metoprolol succinate (TOPROL-XL) 25 MG 24 hr tablet Take 1.5 tablets (37.5 mg total) by mouth daily. (Patient not taking: Reported on  01/07/2021) 45 tablet 11   rosuvastatin (CRESTOR) 40 MG tablet Take 1 tablet (40 mg total) by mouth daily. 90 tablet 3   No current facility-administered medications for this visit.    Physical Exam: Vitals:   01/07/21 0945  BP: 128/84  Pulse: 67  SpO2: 96%  Weight: 170 lb (77.1 kg)  Height: '5\' 9"'$  (1.753 m)    GEN- The patient is well appearing, alert and oriented x 3 today.   Head- normocephalic, atraumatic Eyes-  Sclera clear, conjunctiva pink Ears- hearing intact Oropharynx- clear Lungs- Clear to ausculation bilaterally, normal work of breathing Heart- Regular rate and rhythm, no murmurs, rubs or gallops, PMI not laterally displaced GI- soft, NT, ND, + BS Extremities- no clubbing, cyanosis, or edema  Wt Readings from Last 3  Encounters:  01/07/21 170 lb (77.1 kg)  12/25/20 170 lb (77.1 kg)  10/02/20 172 lb (78 kg)    EKG tracing ordered today is personally reviewed and shows sinus  Assessment and Plan:  Paroxysmal atrial fibrillation Doing well post ablation off AAD therapy ILR has reached RRT. Chads2vasc score is 3.  He is on eliquis He would like to try NSAIDs for his myositis.  I think that it is reasonable to try NSAIDs in low doses.  We discussed risks of bleeding. Given prior stroke, I worry about stopping [place] therapy.  We did discuss Watchman as an option.  Given that he would require long term ASA '325mg'$  daily post watchman, I am not sure that this would provide the appropriate solution.  We may decide to reimplant ILR (current device is at RRT) to further drive decisions going forward.  Could also consider REACT AF trial.  2. Prior stroke Continue eliquis long term As above  3. HL I worry about high dose crestor as the cause of his myalgias Stop crestor and zetia today.  I will ask pharmacy team to assess for possible PCSK9 inhibitor options.  Return to see me in 3 months  Thompson Grayer MD, [facility] 01/07/2021 9:58 AM

## 2021-01-07 NOTE — Patient Instructions (Addendum)
Medication Instructions:  Stop Zetia  Stop Crestor  Your physician recommends that you continue on your current medications as directed. Please refer to the Current Medication list given to you today.  Labwork: None ordered.  Testing/Procedures: None ordered.  Follow-Up: Your physician wants you to follow-up in: 01/11/21 at 9:30 am with the Pharmacy team. 04/17/21 at 10 am 12 with  Thompson Grayer, MD      Any Other Special Instructions Will Be Listed Below (If Applicable).  If you need a refill on your cardiac medications before your next appointment, please call your pharmacy.

## 2021-01-07 NOTE — Telephone Encounter (Signed)
Patient will not need a driver.

## 2021-01-08 ENCOUNTER — Ambulatory Visit: Payer: Medicare Other | Admitting: Sports Medicine

## 2021-01-08 VITALS — Ht 69.0 in | Wt 170.0 lb

## 2021-01-08 DIAGNOSIS — M25511 Pain in right shoulder: Secondary | ICD-10-CM | POA: Diagnosis not present

## 2021-01-08 DIAGNOSIS — M791 Myalgia, unspecified site: Secondary | ICD-10-CM | POA: Diagnosis not present

## 2021-01-08 DIAGNOSIS — M25512 Pain in left shoulder: Secondary | ICD-10-CM | POA: Diagnosis not present

## 2021-01-09 DIAGNOSIS — M791 Myalgia, unspecified site: Secondary | ICD-10-CM | POA: Diagnosis not present

## 2021-01-09 NOTE — Progress Notes (Signed)
   Subjective:    Patient ID: Michael Lambert, male    DOB: 1955/06/03, 65 y.o.   MRN: [id number]  HPI  Michael Lambert comes in today for follow-up on left shoulder pain.  Since his last visit, he called requesting a low-dose of prednisone be prescribed.  I ordered 5 mg of prednisone daily for him.  Historically, he notes that prednisone has helped his shoulder pain tremendously.  He also states that he has similar pain in the right shoulder as well as both hips.  He is wondering about the possibility of polymyalgia rheumatica.  He recently saw his cardiologist who has removed him from all statins for the next 3 months.  His cardiologist also told him that he could take occasional doses of NSAIDs (he is on Eliquis).  He notes that 400 mg of Motrin is helpful.  In addition to pain he has also noticed weakness in his shoulders.    Review of Systems As above    Objective:   Physical Exam  Well-developed, well-nourished.  No acute distress  Examination of both shoulder show good range of motion.  He has good strength on today's exam including good rotator cuff strength.  Neurovascularly intact distally.      Assessment & Plan:   Bilateral shoulder pain-question polymyalgia rheumatica  His degree of pain in his left shoulder is not explained by his recent MRI.  His complaint of bilateral shoulder and hip pain is concerning for possible polymyalgia rheumatica.  I will order some blood work including a sed rate and a C-reactive protein.  If those are elevated, then I will start him on 15 mg of prednisone daily and refer him to rheumatology.  We discussed referral to either Dr. Amil Amen or Dr. Lenna Gilford if this is the case.  Phone follow-up with blood work when available.  In the meantime, continue with Motrin 400 mg as needed.  If blood work is unremarkable then I may need to consider cervical spine as a source of his shoulder pain.

## 2021-01-10 LAB — COMPREHENSIVE METABOLIC PANEL
ALT: 20 IU/L (ref 0–44)
AST: 16 IU/L (ref 0–40)
Albumin/Globulin Ratio: 2 (ref 1.2–2.2)
Albumin: 5 g/dL — ABNORMAL HIGH (ref 3.8–4.8)
Alkaline Phosphatase: 75 IU/L (ref 44–121)
BUN/Creatinine Ratio: 13 (ref 10–24)
BUN: 12 mg/dL (ref 8–27)
Bilirubin Total: 0.7 mg/dL (ref 0.0–1.2)
CO2: 22 mmol/L (ref 20–29)
Calcium: 9.8 mg/dL (ref 8.6–10.2)
Chloride: 100 mmol/L (ref 96–106)
Creatinine, Ser: 0.94 mg/dL (ref 0.76–1.27)
Globulin, Total: 2.5 g/dL (ref 1.5–4.5)
Glucose: 94 mg/dL (ref 65–99)
Potassium: 4.8 mmol/L (ref 3.5–5.2)
Sodium: 140 mmol/L (ref 134–144)
Total Protein: 7.5 g/dL (ref 6.0–8.5)
eGFR: 89 mL/min/{1.73_m2} (ref >59)

## 2021-01-10 LAB — CBC
Hematocrit: 44.8 % (ref 37.5–51.0)
Hemoglobin: 15.1 g/dL (ref 13.0–17.7)
MCH: 32.3 pg (ref 26.6–33.0)
MCHC: 33.7 g/dL (ref 31.5–35.7)
MCV: 96 fL (ref 79–97)
Platelets: 239 10*3/uL (ref 150–450)
RBC: 4.68 x10E6/uL (ref 4.14–5.80)
RDW: 12.7 % (ref 11.6–15.4)
WBC: 10.1 10*3/uL (ref 3.4–10.8)

## 2021-01-10 LAB — SEDIMENTATION RATE: Sed Rate: 13 mm/hr (ref 0–30)

## 2021-01-10 LAB — C-REACTIVE PROTEIN: CRP: 2 mg/L (ref 0–10)

## 2021-01-11 ENCOUNTER — Other Ambulatory Visit: Payer: Self-pay

## 2021-01-11 ENCOUNTER — Ambulatory Visit (INDEPENDENT_AMBULATORY_CARE_PROVIDER_SITE_OTHER): Payer: Medicare Other | Admitting: Pharmacist

## 2021-01-11 DIAGNOSIS — E782 Mixed hyperlipidemia: Secondary | ICD-10-CM | POA: Diagnosis not present

## 2021-01-11 NOTE — Progress Notes (Signed)
Patient ID: Michael Lambert                 DOB: 02/20/1956                    MRN: [id number]     HPI: [name] is a 65 y.o. male patient referred to lipid clinic by Dr. Rayann Heman. PMH is significant for PAF s/p ablation, TIA and GERD. Patient has been experiencing significant bilateral pain in his shoulder and hip. He has seen rheumatology, orthopedics and physical therapy. Thought possibly to be polymyalgia rheumatica, however his CRP and sed rate are normal. CK also normal. He has had a 3 week drug holiday in May with only minimal improvement in symptoms. He was advised to resume rosuvastatin at '40mg'$  daily and zetia '10mg'$  daily. He saw Dr. Rayann Heman 01/07/21 and was advised to stop rosuvastatin and zetia and was referred to lipid clinic.  Patient presents to lipid clinic today. He confirms the PMH above. He increased rosuvastatin to '40mg'$  in January. Muscle pain started in March. He took his last prednisone last night. He has only been on rosuvastatin and ezetimibe for 3 days. Thinks he sees a small improvement. He is also getting dry needling which he thinks is helping. NSAIDs have not provided any benefit.  Current Medications: none Intolerances: rosuvastatin '40mg'$ , ezetimibe '10mg'$  daily??? Bilateral shoulder/hip pain Risk Factors: TIA LDL goal: <70  Diet: not addressed today  Exercise: swims- exercises frequently  Family History:  Family History  Problem Relation Age of Onset   Multiple sclerosis Other        family hx   Hyperlipidemia Father    Ovarian cancer Mother    Colon cancer Neg Hx    Esophageal cancer Neg Hx    Rectal cancer Neg Hx    Stomach cancer Neg Hx    Colon polyps Neg Hx      Social History: The patient  reports that he quit smoking about 40 years ago. He has never used smokeless tobacco. He reports current alcohol use of about 2.0 standard drinks of alcohol per week. He reports that he does not use drugs.  Labs:07/31/20 TC 139, TG 151, HDL 49, LDL 60 (rosuvastatin '40mg'$   daily and zetia '10mg'$  daily)  Past Medical History:  Diagnosis Date   Anxiety    sees Dr. Pearson Grippe    Atrial fibrillation [place])    on Xarelto daily   Benign prostatic hypertrophy    GERD (gastroesophageal reflux disease)    Hyperlipidemia    Migraine    Stroke ([place])    TIA memorial day 2018    Current Outpatient Medications on File Prior to Visit  Medication Sig Dispense Refill   acetaminophen (TYLENOL) 500 MG tablet Take 500 mg by mouth every 6 (six) hours as needed for mild pain or moderate pain.     cholecalciferol (VITAMIN D) 1000 units tablet Take 1,000 Units by mouth daily.     cyclobenzaprine (FLEXERIL) 10 MG tablet Take 1/2-1 tab at bedtime as needed. (Patient not taking: Reported on 01/07/2021) 30 tablet 0   ELIQUIS 5 MG TABS tablet TAKE ONE TABLET BY MOUTH TWICE A DAY 60 tablet 4   esomeprazole (NEXIUM) 20 MG capsule Take 20 mg by mouth daily at 12 noon.     latanoprost (XALATAN) 0.005 % ophthalmic solution Place 1 drop into both eyes at bedtime.     metoprolol succinate (TOPROL-XL) 25 MG 24 hr tablet Take 1.5 tablets (37.5 mg total)  by mouth daily. (Patient not taking: Reported on 01/07/2021) 45 tablet 11   predniSONE (DELTASONE) 5 MG tablet Take 1 tablet (5 mg total) by mouth at bedtime. 7 tablet 0   sertraline (ZOLOFT) 25 MG tablet Take 25 mg by mouth daily.     No current facility-administered medications on file prior to visit.    No Known Allergies  Assessment/Plan:  1. Hyperlipidemia - Patient currently off of all lipid therapy to help determine if this is the cause of his muscle pain. Plan to remain off for a month to a month and a half in order to really see what was causing the pain. He will continue to see Dr. Micheline Chapman for further workup of source. We did discuss all treatment options including PCSK9i, re challenging at a lower statin dose or Leqvio. Patient will call us in 1-1.5 months and let us know which avenue he would like to go. Cost and benefit of all  options discussed.    Thank you,   Ramond Dial, Pharm.D, BCPS, CPP [place]  [id number] [place], [place] [address]  Phone: [phone]; Fax: [phone]

## 2021-01-14 ENCOUNTER — Telehealth: Payer: Self-pay | Admitting: Sports Medicine

## 2021-01-14 ENCOUNTER — Other Ambulatory Visit: Payer: Self-pay

## 2021-01-14 MED ORDER — PREDNISONE 10 MG PO TABS: ORAL_TABLET | ORAL | 0 refills | Status: DC

## 2021-01-14 NOTE — Telephone Encounter (Signed)
  I spoke with Michael Lambert on the phone today after reviewing some recent blood work.  His blood work is within normal limits including a normal CRP and sed rate.  This would suggest that he does not have polymyalgia rheumatica.  However, I did recommend that Michael Lambert follow-up with his rheumatologist via telephone or email for their opinion about this.  His symptoms are suggestive and his response to prednisone may fit that picture as well.  He is leaving for an out-of-town trip later this week and  I have agreed to give him a repeat 6-day Sterapred Dosepak to take with him.  He will take it only if needed.  He will follow-up with me again in the office next week when he returns to [place].  I discussed the possibility of cervical spine as being his pain generator and we will discuss this further next week.

## 2021-01-22 ENCOUNTER — Other Ambulatory Visit: Payer: Self-pay

## 2021-01-22 ENCOUNTER — Ambulatory Visit: Payer: Medicare Other | Admitting: Sports Medicine

## 2021-01-22 VITALS — Ht 69.0 in | Wt 170.0 lb

## 2021-01-22 DIAGNOSIS — M25512 Pain in left shoulder: Secondary | ICD-10-CM

## 2021-01-22 DIAGNOSIS — M1711 Unilateral primary osteoarthritis, right knee: Secondary | ICD-10-CM

## 2021-01-23 ENCOUNTER — Ambulatory Visit
Admission: RE | Admit: 2021-01-23 | Discharge: 2021-01-23 | Disposition: A | Discharge status: Home / Self Care | Payer: Medicare Other | Source: Ambulatory Visit | Attending: Sports Medicine | Admitting: Sports Medicine

## 2021-01-23 DIAGNOSIS — M25512 Pain in left shoulder: Secondary | ICD-10-CM | POA: Diagnosis not present

## 2021-01-23 NOTE — Progress Notes (Addendum)
Patient ID: Michael Lambert, male   DOB: [birth year], 65 y.o.   MRN: [id number]  [name] is following up today at my request.  He just finished another taper of oral prednisone.  He is feeling pretty good.  Shoulder pain has improved.  He still having pain in the right knee.  Previous MRI showed medial meniscal tear with moderate degenerative changes, primarily at the patellofemoral joint.  He does not endorse any mechanical symptoms.  Just achiness and stiffness.  I had a long talk with him about both of these issues.  For his shoulders, we will take a watchful waiting approach.  I still question whether or not his symptoms are coming from his neck.  I would like to go ahead and get an x-ray of his cervical spine and I will call him with those results.  He may ultimately need an MRI.  In regards to his right knee, symptoms are tolerable.  I discussed the possibility of cortisone injection and surgical referral but his symptoms are not that severe.  He is educated in some quad strengthening exercises and have asked him to start incorporating these into his workout routine.  He also enjoys cycling and I have asked that he have a bike fit to make sure that his seat is appropriately positioned.  We will leave things open-ended for him to follow-up as needed.   Addendum: Cervical spine x-rays reviewed on 02/01/2021.  They are unremarkable.  I do not see any significant degenerative disc disease.  I still think it is worth considering an MRI of his cervical spine if his pain returns.

## 2021-01-30 DIAGNOSIS — I10 Essential (primary) hypertension: Secondary | ICD-10-CM | POA: Diagnosis not present

## 2021-01-30 DIAGNOSIS — I4891 Unspecified atrial fibrillation: Secondary | ICD-10-CM | POA: Diagnosis not present

## 2021-01-30 DIAGNOSIS — K219 Gastro-esophageal reflux disease without esophagitis: Secondary | ICD-10-CM | POA: Diagnosis not present

## 2021-01-30 DIAGNOSIS — E7800 Pure hypercholesterolemia, unspecified: Secondary | ICD-10-CM | POA: Diagnosis not present

## 2021-01-30 DIAGNOSIS — D6869 Other thrombophilia: Secondary | ICD-10-CM | POA: Diagnosis not present

## 2021-01-30 DIAGNOSIS — H40009 Preglaucoma, unspecified, unspecified eye: Secondary | ICD-10-CM | POA: Diagnosis not present

## 2021-01-30 DIAGNOSIS — Z0000 Encounter for general adult medical examination without abnormal findings: Secondary | ICD-10-CM | POA: Diagnosis not present

## 2021-01-30 DIAGNOSIS — Z23 Encounter for immunization: Secondary | ICD-10-CM | POA: Diagnosis not present

## 2021-01-30 DIAGNOSIS — I2510 Atherosclerotic heart disease of native coronary artery without angina pectoris: Secondary | ICD-10-CM | POA: Diagnosis not present

## 2021-01-30 DIAGNOSIS — M2550 Pain in unspecified joint: Secondary | ICD-10-CM | POA: Diagnosis not present

## 2021-01-30 DIAGNOSIS — Z8673 Personal history of transient ischemic attack (TIA), and cerebral infarction without residual deficits: Secondary | ICD-10-CM | POA: Diagnosis not present

## 2021-02-07 DIAGNOSIS — H2513 Age-related nuclear cataract, bilateral: Secondary | ICD-10-CM | POA: Diagnosis not present

## 2021-02-07 DIAGNOSIS — H401131 Primary open-angle glaucoma, bilateral, mild stage: Secondary | ICD-10-CM | POA: Diagnosis not present

## 2021-02-20 ENCOUNTER — Other Ambulatory Visit: Payer: Self-pay

## 2021-02-20 MED ORDER — PREDNISONE 10 MG PO TABS: ORAL_TABLET | ORAL | 0 refills | Status: DC

## 2021-03-06 ENCOUNTER — Telehealth: Payer: Self-pay | Admitting: *Deleted

## 2021-03-06 DIAGNOSIS — M25511 Pain in right shoulder: Secondary | ICD-10-CM | POA: Diagnosis not present

## 2021-03-06 DIAGNOSIS — M7542 Impingement syndrome of left shoulder: Secondary | ICD-10-CM | POA: Diagnosis not present

## 2021-03-06 DIAGNOSIS — M25512 Pain in left shoulder: Secondary | ICD-10-CM | POA: Diagnosis not present

## 2021-03-06 NOTE — Telephone Encounter (Signed)
   Pre-operative Risk Assessment    Patient Name: Michael Lambert  DOB: February 06, 1956 MRN: [id number]      Request for Surgical Clearance   Procedure:   LEFT SHOULDER ARTHROSCOPY  Date of Surgery: Clearance TBD                                 Surgeon:  DR. Lennette Bihari SUPPLE Surgeon's Group or Practice Name:  [facility] Phone number:  [phone] Fax number:  [phone] ATTN: [place]   Type of Clearance Requested: - Medical  - Pharmacy:  Hold Apixaban (Eliquis)     Type of Anesthesia:   General    Additional requests/questions:   Jiles Prows   03/06/2021, 12:22 PM

## 2021-03-07 ENCOUNTER — Ambulatory Visit (INDEPENDENT_AMBULATORY_CARE_PROVIDER_SITE_OTHER): Payer: Medicare Other | Admitting: Sports Medicine

## 2021-03-07 DIAGNOSIS — M7542 Impingement syndrome of left shoulder: Secondary | ICD-10-CM | POA: Diagnosis not present

## 2021-03-07 NOTE — Progress Notes (Signed)
Chief complaint left shoulder pain  Patient is an active individual who swims for exercise In the spring he began having significant left shoulder pain He was diagnosed with impingement syndrome He was treated by Dr. Micheline Chapman He also was treated at 1 point by Dr. Rip Harbour Both injections and oral cortisone give him significant relief of pain but the relief only lasts a couple weeks He has done exercises but rather than improving the shoulder has steadily gotten worse  He saw Dr. Onnie Graham yesterday With his chronic limitations and significant pain Dr. Onnie Graham suggested that arthroscopic surgery to lessen the degree of impingement and clean out some of the damage to the joint would be appropriate He comes to me for my opinion as to whether to continue with conservative care or go ahead with the surgery  Review of systems No neck pain or radicular symptoms from his neck -note he did have a normal neck x-ray That pain that sometimes awakens him from sleep Mild pain in the right shoulder that is similar in its pattern  Physical exam Pleasant white male in no acute distress BP (!) 142/88   Ht 5\' 9"  (1.753 m)   Wt 175 lb (79.4 kg)   BMI 25.84 kg/m  [place] Adult Exercise 12/25/2020 01/08/2021 01/22/2021 03/07/2021  Frequency of aerobic exercise (# of days/week) 5 6 6 6   Average time in minutes 60 30 30 30   Frequency of strengthening activities (# of days/week) 5 6 5 5    Patient demonstrates good posture He has good paraspinal muscle development and he does not have scapular dyskinesis Pain with all impingement tests Significant pain with testing of the supraspinatus strength Mild pain with biceps testing and with infraspinatus testing Note he has a very painful arc While he can get full abduction and elevation he has to push through significant pain Internal and external rotation are preserved

## 2021-03-07 NOTE — Assessment & Plan Note (Signed)
I advised the patient that based on his exam I feel that the recommendation that Dr. Onnie Graham gave him for surgery was quite reasonable.  He has done 6 months of conservative care and I think he has been pretty diligent with his exercises.  I do not think he is going to improve dramatically with any new forms of conservative care although I did explain those to him.  I advised him that Dr. Onnie Graham is experienced in this procedure and the indications seem reasonable to me.  I believe he will proceed with the surgery but if he has further questions I am happy to see him.

## 2021-03-08 NOTE — Telephone Encounter (Signed)
Patient with diagnosis of afib on Eliquis for anticoagulation.    Procedure: LEFT SHOULDER ARTHROSCOPY Date of procedure: TBD  CHA2DS2-VASc Score = 5   This indicates a 7.2% annual risk of stroke. The patient's score is based upon: CHF History: 0 HTN History: 1 Diabetes History: 0 Stroke History: 2 Vascular Disease History: 1 Age Score: 1 Gender Score: 0      CrCl 76.23 ml/min  Patient does have hx of TIA. He has afib ablation and ILR showed AF was well controlled. Guidelines do not recommend bridging for DOAC.   Per protocol, patient may hold Eliquis for 2 days prior to procedure.

## 2021-03-08 NOTE — Telephone Encounter (Signed)
   Primary Cardiologist: Sanda Klein, MD  Chart reviewed as part of pre-operative protocol coverage. Given past medical history and time since last visit, based on ACC/AHA guidelines, Reynolds Nettle would be at acceptable risk for the planned procedure without further cardiovascular testing.   Patient with diagnosis of afib on Eliquis for anticoagulation.     Procedure: LEFT SHOULDER ARTHROSCOPY Date of procedure: TBD   CHA2DS2-VASc Score = 5   This indicates a 7.2% annual risk of stroke. The patient's score is based upon: CHF History: 0 HTN History: 1 Diabetes History: 0 Stroke History: 2 Vascular Disease History: 1 Age Score: 1 Gender Score: 0       CrCl 76.23 ml/min   Patient does have hx of TIA. He has afib ablation and ILR showed AF was well controlled. Guidelines do not recommend bridging for DOAC.    Per protocol, patient may hold Eliquis for 2 days prior to procedure.  I will route this recommendation to the requesting party via Epic fax function and remove from pre-op pool.  Please call with questions.  Jossie Ng. Lenix Kidd NP-C    03/08/2021, 12:05 PM [place] [address] Office [phone] Fax [phone]

## 2021-03-14 ENCOUNTER — Ambulatory Visit: Payer: Medicare Other | Admitting: Sports Medicine

## 2021-03-14 VITALS — BP 130/84 | Ht 69.0 in | Wt 175.0 lb

## 2021-03-14 DIAGNOSIS — M25511 Pain in right shoulder: Secondary | ICD-10-CM

## 2021-03-14 DIAGNOSIS — M7541 Impingement syndrome of right shoulder: Secondary | ICD-10-CM | POA: Diagnosis not present

## 2021-03-14 MED ORDER — METHYLPREDNISOLONE ACETATE 40 MG/ML IJ SUSP
40.0000 mg | Freq: Once | INTRAMUSCULAR | Status: AC
  Administered 2021-03-14: 40 mg via INTRA_ARTICULAR

## 2021-03-14 NOTE — Progress Notes (Signed)
   Subjective:    Patient ID: Michael Lambert, male    DOB: 02/10/1956, 65 y.o.   MRN: [id number]  HPI chief complaint: Right shoulder pain  Michael Lambert comes in today to discuss right shoulder pain.  He is scheduled to undergo surgery on the left shoulder in a couple of weeks.  Surgery is with Dr. Onnie Graham for rotator cuff impingement.  Pain in his right shoulder is identical in nature to what he is experiencing in the left shoulder.  He is here to discuss options for pain relief that he can optimize his rehabilitation for his left shoulder postoperatively.    Review of Systems As above    Objective:   Physical Exam  Developed, well nourished.  No acute distress  Right shoulder: Good range of motion with a markedly positive painful arc.  Positive empty can, positive [place].  Rotator cuff strength is 5/5 but does reproduce pain with resisted supraspinatus.  No tenderness over the acromioclavicular joint nor over the bicipital groove.  Neurovascularly intact distally.      Assessment & Plan:   Right shoulder pain likely secondary to rotator cuff impingement  Right shoulder subacromial space is injected today with cortisone.  He tolerates this without difficulty.  Hopefully this will provide him with good pain relief as he rehabilitates his left shoulder postoperatively with Dr. Onnie Graham.  At some point he may need further work-up of the right shoulder, especially if he has good results with left shoulder arthroscopy.  I explained to [name] that work-up for the right shoulder including MRI may best be obtained through Dr. Susie Cassette office.  He will follow-up with me as needed.  Consent obtained and verified. Time-out conducted. Noted no overlying erythema, induration, or other signs of local infection. Skin prepped in a sterile fashion. Topical analgesic spray: Ethyl chloride. Joint: Right shoulder, subacromial space Needle: 25-gauge 1.5 inch Completed without difficulty. Meds: 3 cc 1% Xylocaine, 1  cc (40 mg) Depo-Medrol  Advised to call if fevers/chills, erythema, induration, drainage, or persistent bleeding.   This note was dictated using Dragon naturally speaking software and may contain errors in syntax, spelling, or content which have not been identified prior to signing this note.

## 2021-03-25 DIAGNOSIS — Z4889 Encounter for other specified surgical aftercare: Secondary | ICD-10-CM | POA: Diagnosis not present

## 2021-03-25 DIAGNOSIS — M7542 Impingement syndrome of left shoulder: Secondary | ICD-10-CM | POA: Diagnosis not present

## 2021-03-25 DIAGNOSIS — M25512 Pain in left shoulder: Secondary | ICD-10-CM | POA: Diagnosis not present

## 2021-03-25 DIAGNOSIS — M75102 Unspecified rotator cuff tear or rupture of left shoulder, not specified as traumatic: Secondary | ICD-10-CM | POA: Diagnosis not present

## 2021-03-25 DIAGNOSIS — I890 Lymphedema, not elsewhere classified: Secondary | ICD-10-CM | POA: Diagnosis not present

## 2021-03-26 DIAGNOSIS — S43432A Superior glenoid labrum lesion of left shoulder, initial encounter: Secondary | ICD-10-CM | POA: Diagnosis not present

## 2021-03-26 DIAGNOSIS — I890 Lymphedema, not elsewhere classified: Secondary | ICD-10-CM | POA: Diagnosis not present

## 2021-03-26 DIAGNOSIS — M19012 Primary osteoarthritis, left shoulder: Secondary | ICD-10-CM | POA: Diagnosis not present

## 2021-03-26 DIAGNOSIS — M659 Synovitis and tenosynovitis, unspecified: Secondary | ICD-10-CM | POA: Diagnosis not present

## 2021-03-26 DIAGNOSIS — G8918 Other acute postprocedural pain: Secondary | ICD-10-CM | POA: Diagnosis not present

## 2021-03-26 DIAGNOSIS — Z4889 Encounter for other specified surgical aftercare: Secondary | ICD-10-CM | POA: Diagnosis not present

## 2021-03-26 DIAGNOSIS — M75102 Unspecified rotator cuff tear or rupture of left shoulder, not specified as traumatic: Secondary | ICD-10-CM | POA: Diagnosis not present

## 2021-03-26 DIAGNOSIS — M25512 Pain in left shoulder: Secondary | ICD-10-CM | POA: Diagnosis not present

## 2021-03-26 DIAGNOSIS — M7502 Adhesive capsulitis of left shoulder: Secondary | ICD-10-CM | POA: Diagnosis not present

## 2021-03-26 DIAGNOSIS — M7542 Impingement syndrome of left shoulder: Secondary | ICD-10-CM | POA: Diagnosis not present

## 2021-03-28 DIAGNOSIS — M25512 Pain in left shoulder: Secondary | ICD-10-CM | POA: Diagnosis not present

## 2021-03-28 DIAGNOSIS — M25612 Stiffness of left shoulder, not elsewhere classified: Secondary | ICD-10-CM | POA: Diagnosis not present

## 2021-04-01 DIAGNOSIS — M25512 Pain in left shoulder: Secondary | ICD-10-CM | POA: Diagnosis not present

## 2021-04-01 DIAGNOSIS — M25612 Stiffness of left shoulder, not elsewhere classified: Secondary | ICD-10-CM | POA: Diagnosis not present

## 2021-04-03 DIAGNOSIS — M25512 Pain in left shoulder: Secondary | ICD-10-CM | POA: Diagnosis not present

## 2021-04-03 DIAGNOSIS — M25612 Stiffness of left shoulder, not elsewhere classified: Secondary | ICD-10-CM | POA: Diagnosis not present

## 2021-04-05 DIAGNOSIS — M25512 Pain in left shoulder: Secondary | ICD-10-CM | POA: Diagnosis not present

## 2021-04-08 DIAGNOSIS — M25612 Stiffness of left shoulder, not elsewhere classified: Secondary | ICD-10-CM | POA: Diagnosis not present

## 2021-04-08 DIAGNOSIS — M25512 Pain in left shoulder: Secondary | ICD-10-CM | POA: Diagnosis not present

## 2021-04-10 DIAGNOSIS — M25512 Pain in left shoulder: Secondary | ICD-10-CM | POA: Diagnosis not present

## 2021-04-10 DIAGNOSIS — M25612 Stiffness of left shoulder, not elsewhere classified: Secondary | ICD-10-CM | POA: Diagnosis not present

## 2021-04-15 DIAGNOSIS — M25612 Stiffness of left shoulder, not elsewhere classified: Secondary | ICD-10-CM | POA: Diagnosis not present

## 2021-04-15 DIAGNOSIS — M25512 Pain in left shoulder: Secondary | ICD-10-CM | POA: Diagnosis not present

## 2021-04-17 ENCOUNTER — Other Ambulatory Visit: Payer: Self-pay

## 2021-04-17 ENCOUNTER — Encounter: Payer: Self-pay | Admitting: Internal Medicine

## 2021-04-17 ENCOUNTER — Ambulatory Visit: Payer: Medicare Other | Admitting: Internal Medicine

## 2021-04-17 VITALS — BP 128/76 | HR 68 | Ht 69.0 in | Wt 177.0 lb

## 2021-04-17 DIAGNOSIS — E785 Hyperlipidemia, unspecified: Secondary | ICD-10-CM

## 2021-04-17 DIAGNOSIS — I480 Paroxysmal atrial fibrillation: Secondary | ICD-10-CM | POA: Diagnosis not present

## 2021-04-17 DIAGNOSIS — I639 Cerebral infarction, unspecified: Secondary | ICD-10-CM | POA: Diagnosis not present

## 2021-04-17 HISTORY — PX: OTHER SURGICAL HISTORY: SHX169

## 2021-04-17 NOTE — Patient Instructions (Addendum)
Medication Instructions:  Your physician recommends that you continue on your current medications as directed. Please refer to the Current Medication list given to you today.  Labwork: None ordered.  Testing/Procedures: None ordered.  Follow-Up:  Your physician wants you to follow-up in: 6 months with Dr. Sallyanne Kuster.     Implantable Loop Recorder Removal, Care After This sheet gives you information about how to care for yourself after your procedure. Your health care provider may also give you more specific instructions. If you have problems or questions, contact your health care provider. What can I expect after the procedure? After the procedure, it is common to have: Soreness or discomfort near the incision. Some swelling or bruising near the incision.  Follow these instructions at home: Incision care   Leave your outer dressing on for 24 hours.  After 24 hours you can remove your outer dressing and shower. Leave adhesive strips in place. These skin closures may need to stay in place for 1-2 weeks. If adhesive strip edges start to loosen and curl up, you may trim the loose edges.  You may remove the strips if they have not fallen off after 2 weeks. Check your incision area every day for signs of infection. Check for: Redness, swelling, or pain. Fluid or blood. Warmth. Pus or a bad smell. Do not take baths, swim, or use a hot tub until your incision is completely healed. If your wound site starts to bleed apply pressure.      If you have any questions/concerns please call the device clinic at [phone].  Activity  Return to your normal activities.  Contact a health care provider if: You have redness, swelling, or pain around your incision. You have a fever.

## 2021-04-17 NOTE — Progress Notes (Signed)
PCP: Gaynelle Arabian, MD Primary Cardiologist: Dr Sallyanne Kuster Primary EP: Dr Orene Desanctis is a 65 y.o. male who presents today for routine electrophysiology followup.  Since last being seen in our clinic, the patient reports doing very well.  Today, he denies symptoms of palpitations, chest pain, shortness of breath,  lower extremity edema, dizziness, presyncope, or syncope.  The patient is otherwise without complaint today.   Past Medical History:  Diagnosis Date   Anxiety    sees Dr. Pearson Grippe    Atrial fibrillation [facility])    on Xarelto daily   Benign prostatic hypertrophy    GERD (gastroesophageal reflux disease)    Hyperlipidemia    Migraine    Stroke ([place])    TIA memorial day 2018   Past Surgical History:  Procedure Laterality Date   APPENDECTOMY     ATRIAL FIBRILLATION ABLATION N/A 07/03/2020   Procedure: ATRIAL FIBRILLATION ABLATION;  Surgeon: Thompson Grayer, MD;  Location: [place] [facility];  Service: Cardiovascular;  Laterality: N/A;   colonoscopy  10-08-12   per Dr. Deatra Ina, adenomatous polyps, repeat in 5 yrs    COLONOSCOPY     CYSTOSCOPY     per Dr. Rosana Hoes-    gum graft  08/2009   Dr Royston Cowper    HERNIA REPAIR  2008   bilateral inguinal, per Dr. Zella Richer   LOOP RECORDER INSERTION N/A 11/18/2016   Procedure: Loop Recorder Insertion;  Surgeon: Thompson Grayer, MD;  Location: [place] [facility];  Service: Cardiovascular;  Laterality: N/A;   PALATE / UVULA BIOPSY / EXCISION  2004   per Dr. Lucia Gaskins, for snoring    RADIAL KERATOTOMY     sees Dr. Mercer Pod as FU  at [facility] , bilateral,  dr stonecipher did the RK    repair detached retina     TEE WITHOUT CARDIOVERSION N/A 11/18/2016   Procedure: TRANSESOPHAGEAL ECHOCARDIOGRAM (TEE);  Surgeon: Thayer Headings, MD;  Location: [facility] ENDOSCOPY;  Service: Cardiovascular;  Laterality: N/A;   TONSILLECTOMY      ROS- all systems are reviewed and negatives except as per HPI above  Current Outpatient Medications   Medication Sig Dispense Refill   acetaminophen (TYLENOL) 500 MG tablet Take 500 mg by mouth every 6 (six) hours as needed for mild pain or moderate pain.     cholecalciferol (VITAMIN D) 1000 units tablet Take 1,000 Units by mouth daily.     cyclobenzaprine (FLEXERIL) 10 MG tablet Take 1/2-1 tab at bedtime as needed. (Patient not taking: Reported on 01/07/2021) 30 tablet 0   ELIQUIS 5 MG TABS tablet TAKE ONE TABLET BY MOUTH TWICE A DAY 60 tablet 4   esomeprazole (NEXIUM) 20 MG capsule Take 20 mg by mouth daily at 12 noon.     latanoprost (XALATAN) 0.005 % ophthalmic solution Place 1 drop into both eyes at bedtime.     metoprolol succinate (TOPROL-XL) 25 MG 24 hr tablet Take 1.5 tablets (37.5 mg total) by mouth daily. (Patient not taking: Reported on 01/07/2021) 45 tablet 11   predniSONE (DELTASONE) 10 MG tablet Use as directed per doctors orders. 21 tablet 0   sertraline (ZOLOFT) 25 MG tablet Take 25 mg by mouth daily.     traMADol (ULTRAM) 50 MG tablet Take 50 mg by mouth at bedtime.     No current facility-administered medications for this visit.    Physical Exam: There were no vitals filed for this visit.  GEN- The patient is well appearing, alert and  oriented x 3 today.   Head- normocephalic, atraumatic Eyes-  Sclera clear, conjunctiva pink Ears- hearing intact Oropharynx- clear Lungs- Clear to ausculation bilaterally, normal work of breathing Heart- Regular rate and rhythm, no murmurs, rubs or gallops, PMI not laterally displaced GI- soft, NT, ND, + BS Extremities- no clubbing, cyanosis, or edema  Wt Readings from Last 3 Encounters:  03/14/21 175 lb (79.4 kg)  03/07/21 175 lb (79.4 kg)  01/22/21 170 lb (77.1 kg)    EKG tracing ordered today is personally reviewed and shows sinus  Assessment and Plan:  Paroxysmal atrial fibrillation Well controlled post ablation Chads2vasc score is 3.  He is on eliquis  2. HL Follows in the lipid clinic  ILR is at RRT.  He wishes to  have this removed.  We discussed risks and benefits to ILR removal including but not limited to bleeding and infection.  He accepts risks and wishes to proceed.  Follow-up with Dr Sallyanne Kuster in 6 months  Thompson Grayer MD, [facility] 04/17/2021 10:04 AM    PROCEDURES:   1. Implantable loop recorder explantation     DESCRIPTION OF PROCEDURE:  Informed written consent was obtained.  The patient required no sedation for the procedure today.   The patients left chest was therefore prepped and draped in the usual sterile fashion.  The skin overlying the ILR monitor was infiltrated with lidocaine for local analgesia.  A 0.5-cm incision was made over the site.  The previously implanted ILR was exposed and removed using a combination of sharp and blunt dissection.  Steri- Strips and a sterile dressing were then applied. EBL<69ml.  There were no early apparent complications.     CONCLUSIONS:   1. Successful explantation of a [employer] Reveal LINQ implantable loop recorder   2. No early apparent complications.        Thompson Grayer MD, [facility] 04/17/2021 10:17 AM

## 2021-04-18 DIAGNOSIS — M25512 Pain in left shoulder: Secondary | ICD-10-CM | POA: Diagnosis not present

## 2021-04-22 ENCOUNTER — Ambulatory Visit: Payer: Medicare Other | Admitting: Internal Medicine

## 2021-04-22 DIAGNOSIS — M25512 Pain in left shoulder: Secondary | ICD-10-CM | POA: Diagnosis not present

## 2021-04-22 DIAGNOSIS — M25612 Stiffness of left shoulder, not elsewhere classified: Secondary | ICD-10-CM | POA: Diagnosis not present

## 2021-04-24 DIAGNOSIS — B351 Tinea unguium: Secondary | ICD-10-CM | POA: Diagnosis not present

## 2021-04-24 DIAGNOSIS — L812 Freckles: Secondary | ICD-10-CM | POA: Diagnosis not present

## 2021-04-24 DIAGNOSIS — L821 Other seborrheic keratosis: Secondary | ICD-10-CM | POA: Diagnosis not present

## 2021-04-26 DIAGNOSIS — M25512 Pain in left shoulder: Secondary | ICD-10-CM | POA: Diagnosis not present

## 2021-04-26 DIAGNOSIS — M25612 Stiffness of left shoulder, not elsewhere classified: Secondary | ICD-10-CM | POA: Diagnosis not present

## 2021-04-29 DIAGNOSIS — M25512 Pain in left shoulder: Secondary | ICD-10-CM | POA: Diagnosis not present

## 2021-04-29 DIAGNOSIS — M25612 Stiffness of left shoulder, not elsewhere classified: Secondary | ICD-10-CM | POA: Diagnosis not present

## 2021-05-02 ENCOUNTER — Ambulatory Visit: Payer: Medicare Other | Admitting: Sports Medicine

## 2021-05-02 VITALS — BP 122/78 | Ht 69.0 in | Wt 175.0 lb

## 2021-05-02 DIAGNOSIS — M1711 Unilateral primary osteoarthritis, right knee: Secondary | ICD-10-CM | POA: Diagnosis not present

## 2021-05-02 NOTE — Patient Instructions (Signed)
Thank you for coming to see me today. It was a pleasure. Today we talked about:   We will give you exercises to do at home.  Try to do these 4-5 times a week.  Continue with biking, but be careful clipping in and out that you don't put too much strain on your knee.  Continue your topical pain medication to avoid taking oral anti-inflammatories.  You can ice when it feels stiff and also use a compression sleeve if you will be standing and walking a lot.  Please follow-up with [place] in 2-3 months as needed.  If you have any questions or concerns, please do not hesitate to call the office at [phone].  Best,   Arizona Constable, DO [place]

## 2021-05-02 NOTE — Assessment & Plan Note (Signed)
Discussed that given his prior history of ACL repair, patient's knee is actually doing very well in comparison.  He has some mild OA already noted on MRI.  Advised him to be mindful when he is coming in and out of biking, however recommended continuing biking, especially road biking in a position that is comfortable and not in too much flexion to help keep his knee strong and offloaded.  Recommended continuing topical anti-inflammatories, advised against oral anti-inflammatories as he is on an anticoagulant.  Advised that when the knee is stiff, he can ice it as needed.  Recommend compression sleeve with standing and walking a lot.  Also given home exercise program for lateral leg lifts, straight leg lifts, isometric quad strengthening.  Follow-up in 2 to 3 months as needed.

## 2021-05-02 NOTE — Progress Notes (Signed)
° °  Michael Lambert is a 65 y.o. male who presents to [facility] today for the following:  Right knee pain Structures are MRI performed on 6/30 that showed small tear of lateral meniscus and complex tear of posterior horn, prior ACL repair, mild lateral and patellofemoral compartment osteoarthritis with a small Baker's cyst Overall, only has occasional aching, but presents today to discuss options for keeping his knee in the best health States that it sometimes keeps him up at night when the pain is most severe, however the pain rather seems rare Mostly the stiffness is what bothers him He bikes and swims for exercise, does mountain biking as well as road biking, and states that usually the stiffness is worse the next day He denies much swelling States that sometimes he does have posterior pain, however is hard for him to remember the localization of the pain States that the pain does not necessarily keep him from doing things, but usually after doing things is when he notices the pain and stiffness Occasionally has some pain with stairs, but this is usually when it is the most stiff   PMH reviewed.  ROS as above. Medications reviewed.  Exam:  BP 122/78    Ht 5\' 9"  (1.753 m)    Wt 175 lb (79.4 kg)    BMI 25.84 kg/m  Gen: Well NAD MSK:  Right Knee: - Inspection: 1+ effusion of the right knee and no significant overlying skin changes, erythema, bruising - Palpation: no TTP b/l, 1+ crepitus on the right with range of motion - ROM: Range of motion on right is limited by about 10 degrees in flexion and lacks about 2 degrees of extension when compared to the left, full active ROM with flexion and extension in knee on left and hip b/l - Strength: 5/5 strength b/l, he is slightly weaker in hip abduction on the right than the left - Neuro/vasc: NV intact distally b/l - Special Tests: - LIGAMENTS: negative anterior and posterior drawer, negative Lachman's, no MCL or LCL laxity  -- MENISCUS: negative  McMurray's -- PF JOINT: nml patellar mobility bilaterally.  negative patellar grind, negative patellar apprehension  Hips: normal ROM   No results found.   Assessment and Plan: 1) Primary osteoarthritis of right knee Discussed that given his prior history of ACL repair, patient's knee is actually doing very well in comparison.  He has some mild OA already noted on MRI.  Advised him to be mindful when he is coming in and out of biking, however recommended continuing biking, especially road biking in a position that is comfortable and not in too much flexion to help keep his knee strong and offloaded.  Recommended continuing topical anti-inflammatories, advised against oral anti-inflammatories as he is on an anticoagulant.  Advised that when the knee is stiff, he can ice it as needed.  Recommend compression sleeve with standing and walking a lot.  Also given home exercise program for lateral leg lifts, straight leg lifts, isometric quad strengthening.  Follow-up in 2 to 3 months as needed.   [place] Constable, D.O.  PGY-4 [facility]  05/02/2021 5:51 PM  I observed and examined the patient with the [facility] resident and agree with assessment and plan.  Note reviewed and modified by me. Ila Mcgill, MD

## 2021-05-08 DIAGNOSIS — M25612 Stiffness of left shoulder, not elsewhere classified: Secondary | ICD-10-CM | POA: Diagnosis not present

## 2021-05-08 DIAGNOSIS — M25512 Pain in left shoulder: Secondary | ICD-10-CM | POA: Diagnosis not present

## 2021-05-10 DIAGNOSIS — M7502 Adhesive capsulitis of left shoulder: Secondary | ICD-10-CM | POA: Diagnosis not present

## 2021-05-15 DIAGNOSIS — M25512 Pain in left shoulder: Secondary | ICD-10-CM | POA: Diagnosis not present

## 2021-05-15 DIAGNOSIS — M25612 Stiffness of left shoulder, not elsewhere classified: Secondary | ICD-10-CM | POA: Diagnosis not present

## 2021-05-21 DIAGNOSIS — Z79899 Other long term (current) drug therapy: Secondary | ICD-10-CM | POA: Diagnosis not present

## 2021-05-21 DIAGNOSIS — B351 Tinea unguium: Secondary | ICD-10-CM | POA: Diagnosis not present

## 2021-05-23 DIAGNOSIS — M25512 Pain in left shoulder: Secondary | ICD-10-CM | POA: Diagnosis not present

## 2021-05-23 DIAGNOSIS — M25612 Stiffness of left shoulder, not elsewhere classified: Secondary | ICD-10-CM | POA: Diagnosis not present

## 2021-05-26 DIAGNOSIS — M25511 Pain in right shoulder: Secondary | ICD-10-CM | POA: Diagnosis not present

## 2021-05-27 DIAGNOSIS — M25512 Pain in left shoulder: Secondary | ICD-10-CM | POA: Diagnosis not present

## 2021-05-27 DIAGNOSIS — M25612 Stiffness of left shoulder, not elsewhere classified: Secondary | ICD-10-CM | POA: Diagnosis not present

## 2021-05-28 ENCOUNTER — Ambulatory Visit: Payer: Medicare Other | Admitting: Sports Medicine

## 2021-05-30 DIAGNOSIS — M25612 Stiffness of left shoulder, not elsewhere classified: Secondary | ICD-10-CM | POA: Diagnosis not present

## 2021-05-30 DIAGNOSIS — M25512 Pain in left shoulder: Secondary | ICD-10-CM | POA: Diagnosis not present

## 2021-06-05 DIAGNOSIS — M25512 Pain in left shoulder: Secondary | ICD-10-CM | POA: Diagnosis not present

## 2021-06-05 DIAGNOSIS — M25612 Stiffness of left shoulder, not elsewhere classified: Secondary | ICD-10-CM | POA: Diagnosis not present

## 2021-06-07 DIAGNOSIS — M75101 Unspecified rotator cuff tear or rupture of right shoulder, not specified as traumatic: Secondary | ICD-10-CM | POA: Diagnosis not present

## 2021-06-07 DIAGNOSIS — M25612 Stiffness of left shoulder, not elsewhere classified: Secondary | ICD-10-CM | POA: Diagnosis not present

## 2021-06-07 DIAGNOSIS — M25512 Pain in left shoulder: Secondary | ICD-10-CM | POA: Diagnosis not present

## 2021-06-10 DIAGNOSIS — M25512 Pain in left shoulder: Secondary | ICD-10-CM | POA: Diagnosis not present

## 2021-06-10 DIAGNOSIS — M25612 Stiffness of left shoulder, not elsewhere classified: Secondary | ICD-10-CM | POA: Diagnosis not present

## 2021-06-12 DIAGNOSIS — M25561 Pain in right knee: Secondary | ICD-10-CM | POA: Diagnosis not present

## 2021-06-12 DIAGNOSIS — M25511 Pain in right shoulder: Secondary | ICD-10-CM | POA: Diagnosis not present

## 2021-06-18 DIAGNOSIS — M25511 Pain in right shoulder: Secondary | ICD-10-CM | POA: Diagnosis not present

## 2021-06-20 DIAGNOSIS — M25511 Pain in right shoulder: Secondary | ICD-10-CM | POA: Diagnosis not present

## 2021-06-25 DIAGNOSIS — M25511 Pain in right shoulder: Secondary | ICD-10-CM | POA: Diagnosis not present

## 2021-07-05 ENCOUNTER — Telehealth: Payer: Self-pay

## 2021-07-05 NOTE — Telephone Encounter (Signed)
° °  Pre-operative Risk Assessment    Patient Name: Michael Lambert  DOB: [birth year] MRN: [id number]      Request for Surgical Clearance    Procedure:   Right shoulder Arthroscopy  Date of Surgery:  Clearance 08/09/21                                 Surgeon:  Dr. Justice Britain  Surgeon's Group or Practice Name:  [facility]  Phone number:  [phone] Fax number:  [phone]   Type of Clearance Requested:   - Medical  - Pharmacy:  Hold Apixaban (Eliquis)   Need instructions    Type of Anesthesia:  General    Additional requests/questions:    Tana Conch   07/05/2021, 4:49 PM

## 2021-07-08 NOTE — Telephone Encounter (Signed)
Patient with diagnosis of afib on Eliquis for anticoagulation.    Procedure: right shoulder arthroscopy Date of procedure: 08/09/21  CHA2DS2-VASc Score = 5  This indicates a 7.2% annual risk of stroke. The patient's score is based upon: CHF History: 0 HTN History: 1 Diabetes History: 0 Stroke History: 2 Vascular Disease History: 1 Age Score: 1 Gender Score: 0   Had TIA in May 2018, loop recorder placed July 2018, now post afib ablation February 2022.  CrCl 69mL/min Platelet count 239K  Pt previously cleared to hold Eliquis for 2 days before his left shoulder arthroscopy. Would see if surgeon is ok with 2 day hold again for right shoulder (3 day hold should be ok if necessary, ideally want to minimize to 2 days if possible given prior TIA).

## 2021-07-09 NOTE — Telephone Encounter (Signed)
° °  Name: Michael Lambert  DOB: 03-09-1956  MRN: [id number]   Primary Cardiologist: Sanda Klein, MD, more recently Dr. Rayann Heman  Chart reviewed as part of pre-operative protocol coverage. Patient was contacted 07/09/2021 in reference to pre-operative risk assessment for pending surgery as outlined below.  Bardia Paschen was last seen 03/2021 by Dr. Rayann Heman. Primarily followed for history of atrial fib diagnosed in context of hx of TIA, prior ablation 06/2020. Last echo 06/2020 EF 60-65%, mild MR.   Coronary CTA 11/2018 did demonstrate moderate mixed RCA stenosis 50-69%, 1-24% prox LAD - > FFR showed normal FFR in the RCA and LCx, not suggestive of hemodynamically significant stenosis but did show that the distal LAD was abnormal at 0.78 with tapered flow upstream of that suggestive of significant disease. He had not had prior cardiac catheterization for evaluation but has done well over the years (very fit per prior notes, but with family hx of disease). I will route to Dr. Sallyanne Kuster for input on whether he thinks the patient would need further ischemic testing prior to clearing, or whether he would be OK with [place] clearing patient for shoulder surgery under general anesthesia as long as he has remained asymptomatic. Dr. Sallyanne Kuster. - Please route response to P CV DIV PREOP (the pre-op pool). Thank you.  Will also route to callback to clarify with surgeon pharmD's question of whether they are OK with a 2 day hold of Eliquis instead of 3 day hold.  Charlie Pitter, PA-C 07/09/2021, 10:50 AM

## 2021-07-09 NOTE — Telephone Encounter (Signed)
Call placed to [facility], Surgical Scheduler, left a message for her to call back.

## 2021-07-09 NOTE — Telephone Encounter (Signed)
Low risk for planned shoulder surgery. OK to hold the anticoagulant for 2-3 days before surgery.

## 2021-07-09 NOTE — Telephone Encounter (Signed)
° °  Name: Michael Lambert  DOB: [birth year]  MRN: [id number]   Primary Cardiologist: Sanda Klein, MD  Chart revisited as part of pre-operative protocol coverage. I reached out to patient for update on how he is doing. The patient affirms he has been doing well without any new cardiac symptoms. Still able to ride his bike without any cardiac limitations (he was about to go on 7 mile bike ride when I called). Therefore, based on ACC/AHA guidelines, the patient would be at acceptable risk for the planned procedure without further cardiovascular testing. Per review with Dr. Sallyanne Kuster, he is felt to be at low risk for planned shoulder surgery.  Regarding anticoagulation, our pharmacist did recommend holding for 2 days if surgeon was OK with this. They felt 3 day hold should be ok if necessary, ideally want to minimize to 2 days if possible given prior TIA. Dr. Sallyanne Kuster did feel it was okay to hold for 2-3 days before surgery. We will defer to surgeon to review and advise patient on final instructions.  The patient was advised that if he develops new symptoms prior to surgery to contact our office to arrange for a follow-up visit, and he verbalized understanding.  I will route this recommendation to the requesting party via Epic fax function and remove from pre-op pool. Please call with questions.  Charlie Pitter, PA-C 07/09/2021, 1:47 PM

## 2021-08-08 DIAGNOSIS — H52203 Unspecified astigmatism, bilateral: Secondary | ICD-10-CM | POA: Diagnosis not present

## 2021-08-08 DIAGNOSIS — H524 Presbyopia: Secondary | ICD-10-CM | POA: Diagnosis not present

## 2021-08-08 DIAGNOSIS — H401131 Primary open-angle glaucoma, bilateral, mild stage: Secondary | ICD-10-CM | POA: Diagnosis not present

## 2021-08-09 DIAGNOSIS — M75121 Complete rotator cuff tear or rupture of right shoulder, not specified as traumatic: Secondary | ICD-10-CM | POA: Diagnosis not present

## 2021-08-09 DIAGNOSIS — M7501 Adhesive capsulitis of right shoulder: Secondary | ICD-10-CM | POA: Diagnosis not present

## 2021-08-09 DIAGNOSIS — M7541 Impingement syndrome of right shoulder: Secondary | ICD-10-CM | POA: Diagnosis not present

## 2021-08-09 DIAGNOSIS — S43431A Superior glenoid labrum lesion of right shoulder, initial encounter: Secondary | ICD-10-CM | POA: Diagnosis not present

## 2021-08-09 DIAGNOSIS — G8918 Other acute postprocedural pain: Secondary | ICD-10-CM | POA: Diagnosis not present

## 2021-08-09 DIAGNOSIS — S43491A Other sprain of right shoulder joint, initial encounter: Secondary | ICD-10-CM | POA: Diagnosis not present

## 2021-08-09 DIAGNOSIS — M19011 Primary osteoarthritis, right shoulder: Secondary | ICD-10-CM | POA: Diagnosis not present

## 2021-08-19 DIAGNOSIS — M25511 Pain in right shoulder: Secondary | ICD-10-CM | POA: Diagnosis not present

## 2021-08-27 DIAGNOSIS — M25511 Pain in right shoulder: Secondary | ICD-10-CM | POA: Diagnosis not present

## 2021-08-29 DIAGNOSIS — R413 Other amnesia: Secondary | ICD-10-CM | POA: Diagnosis not present

## 2021-09-03 DIAGNOSIS — M25511 Pain in right shoulder: Secondary | ICD-10-CM | POA: Diagnosis not present

## 2021-09-10 DIAGNOSIS — M25511 Pain in right shoulder: Secondary | ICD-10-CM | POA: Diagnosis not present

## 2021-09-17 DIAGNOSIS — M25511 Pain in right shoulder: Secondary | ICD-10-CM | POA: Diagnosis not present

## 2021-09-20 DIAGNOSIS — M25511 Pain in right shoulder: Secondary | ICD-10-CM | POA: Diagnosis not present

## 2021-09-24 DIAGNOSIS — M25511 Pain in right shoulder: Secondary | ICD-10-CM | POA: Diagnosis not present

## 2021-09-27 DIAGNOSIS — M25511 Pain in right shoulder: Secondary | ICD-10-CM | POA: Diagnosis not present

## 2021-10-01 DIAGNOSIS — M25511 Pain in right shoulder: Secondary | ICD-10-CM | POA: Diagnosis not present

## 2021-10-03 ENCOUNTER — Ambulatory Visit: Payer: Medicare Other | Admitting: Cardiovascular Disease

## 2021-10-03 ENCOUNTER — Encounter: Payer: Self-pay | Admitting: Cardiovascular Disease

## 2021-10-03 VITALS — BP 124/74 | HR 72 | Ht 69.0 in | Wt 175.4 lb

## 2021-10-03 DIAGNOSIS — I480 Paroxysmal atrial fibrillation: Secondary | ICD-10-CM | POA: Diagnosis not present

## 2021-10-03 DIAGNOSIS — E782 Mixed hyperlipidemia: Secondary | ICD-10-CM

## 2021-10-03 DIAGNOSIS — I2510 Atherosclerotic heart disease of native coronary artery without angina pectoris: Secondary | ICD-10-CM | POA: Diagnosis not present

## 2021-10-03 DIAGNOSIS — I10 Essential (primary) hypertension: Secondary | ICD-10-CM

## 2021-10-03 DIAGNOSIS — D6869 Other thrombophilia: Secondary | ICD-10-CM | POA: Diagnosis not present

## 2021-10-03 NOTE — Patient Instructions (Signed)

## 2021-10-04 DIAGNOSIS — M25511 Pain in right shoulder: Secondary | ICD-10-CM | POA: Diagnosis not present

## 2021-10-07 ENCOUNTER — Encounter: Payer: Self-pay | Admitting: Cardiovascular Disease

## 2021-10-07 NOTE — Progress Notes (Signed)
Cardiology Office  Note    Date:  10/07/2021   ID:  Michael Lambert, DOB [birth year], MRN [id number]  PCP:  Gaynelle Arabian, MD  Cardiologist:   Sanda Klein, MD   Chief Complaint  Patient presents with   Coronary Artery Disease        Atrial Fibrillation    History of Present Illness:  Michael Lambert is a 66 y.o. male with paroxysmal atrial fibrillation and history of TIA, moderate CAD by coronary CT angiography.  He has had a remarkably successful reduction in atrial fibrillation burden following ablation in February 2022.  No atrial fibrillation has been detected on his loop recorder since March 2022.  He has had to have bilateral shoulder surgery in November of last year and in April of this year when he had a right rotator cuff repair.  He has not been able to ride his bicycle and has been more sedentary.  Still has limitations with mobility of his shoulders and is going to get physical therapy.  Remains in normal sinus rhythm.  Potation's, dizziness, syncope exertional anginal dyspnea, lower extremity edema, claudication, orthopnea or PND.  He has not had any falls, serious injuries or bleeding problems.  He is compliant with Eliquis and rosuvastatin.  Coronary CT angiogram 12/15/2018 showed a calcium score at the 56th percentile had no significant coronary stenoses.  There was a moderate lesion right coronary artery ostium (50-69%.  FFR analysis showed a slow diffuse tapering of the LAD artery with the FFR in the distal vessel of 0.78 (2-2.5 mm vessel at that point).  The ostial RCA was not hemodynamically significant (FFR 0.95).  He experienced a transient ischemic attack in May 2018 and an implantable loop recorder showed that he does have brief episodes of atrial fibrillation.  He does not have any known structural heart disease, but does have mild left atrial dilation (end-systolic diameter of 41 mm) and a very small PFO.    He had some musculoskeletal problems and we tried a  statin holiday, that did really did not make a difference.  He has restarted his rosuvastatin and co-Q10 about 3 weeks ago.  He continues to be very physically active and is quite fit.  He has restricted his alcohol intake to no more than 2 drinks at night.    He has gastroesophageal reflux disease that is well controlled with omeprazole.  He does not have a family history of cardiac disease. His brother also has high cholesterol but has not had coronary other vascular problems by the age of 65. Unfortunately both Iann's parents died relatively young from ovarian cancer and multiple sclerosis respectively.  Past Medical History:  Diagnosis Date   Anxiety    sees Dr. Pearson Grippe    Atrial fibrillation [facility])    on Xarelto daily   Benign prostatic hypertrophy    GERD (gastroesophageal reflux disease)    Hyperlipidemia    Migraine    Stroke ([place])    TIA memorial day 2018    Past Surgical History:  Procedure Laterality Date   APPENDECTOMY     ATRIAL FIBRILLATION ABLATION N/A 07/03/2020   Procedure: ATRIAL FIBRILLATION ABLATION;  Surgeon: Thompson Grayer, MD;  Location: [facility];  Service: Cardiovascular;  Laterality: N/A;   colonoscopy  10/08/2012   per Dr. Deatra Ina, adenomatous polyps, repeat in 5 yrs    COLONOSCOPY     CYSTOSCOPY     per Dr. Rosana Hoes-    gum graft  08/2009  Dr Royston Cowper    HERNIA REPAIR  2008   bilateral inguinal, per Dr. Zella Richer   implantable loop recorder removal  04/17/2021   MDT Reveal LINQ removed   LOOP RECORDER INSERTION N/A 11/18/2016   Procedure: Loop Recorder Insertion;  Surgeon: Thompson Grayer, MD;  Location: [facility];  Service: Cardiovascular;  Laterality: N/A;   PALATE / UVULA BIOPSY / EXCISION  2004   per Dr. Lucia Gaskins, for snoring    RADIAL KERATOTOMY     sees Dr. Mercer Pod as FU  at [facility] , bilateral,  dr stonecipher did the RK    repair detached retina     TEE WITHOUT CARDIOVERSION N/A 11/18/2016   Procedure:  TRANSESOPHAGEAL ECHOCARDIOGRAM (TEE);  Surgeon: Thayer Headings, MD;  [facility] ENDOSCOPY;  Service: Cardiovascular;  Laterality: N/A;   TONSILLECTOMY      Current Medications: Outpatient Medications Prior to Visit  Medication Sig Dispense Refill   acetaminophen (TYLENOL) 500 MG tablet Take 500 mg by mouth every 6 (six) hours as needed for mild pain or moderate pain.     apixaban (ELIQUIS) 5 MG TABS tablet 1 tablet     cholecalciferol (VITAMIN D) 1000 units tablet Take 1,000 Units by mouth daily.     esomeprazole (NEXIUM) 20 MG capsule Take 20 mg by mouth daily at 12 noon.     ezetimibe (ZETIA) 10 MG tablet ezetimibe 10 mg tablet     latanoprost (XALATAN) 0.005 % ophthalmic solution Place 1 drop into both eyes at bedtime.     metoprolol succinate (TOPROL-XL) 25 MG 24 hr tablet Take 1.5 tablets (37.5 mg total) by mouth daily. 45 tablet 11   rosuvastatin (CRESTOR) 40 MG tablet Take 40 mg by mouth daily.     sertraline (ZOLOFT) 25 MG tablet Take 25 mg by mouth daily.     ELIQUIS 5 MG TABS tablet TAKE ONE TABLET BY MOUTH TWICE A DAY 60 tablet 4   metoprolol tartrate (LOPRESSOR) 25 MG tablet SMARTSIG:1 By Mouth     No facility-administered medications prior to visit.     Allergies:   Patient has no known allergies.   Social History   Socioeconomic History   Marital status: Married    Spouse name: Not on file   Number of children: Not on file   Years of education: Not on file   Highest education level: Not on file  Occupational History   Not on file  Tobacco Use   Smoking status: Former    Types: Cigarettes    Quit date: 05/19/1980    Years since quitting: 41.4   Smokeless tobacco: Never  Vaping Use   Vaping Use: Never used  Substance and Sexual Activity   Alcohol use: Yes    Alcohol/week: 2.0 standard drinks    Types: 1 Glasses of wine, 1 Cans of beer per week    Comment: 2 glasses of wine each day   Drug use: No   Sexual activity: Not on file  Other Topics Concern    Not on file  Social History Narrative   Not on file   Social Determinants of Health   Financial Resource Strain: Not on file  Food Insecurity: Not on file  Transportation Needs: Not on file  Physical Activity: Not on file  Stress: Not on file  Social Connections: Not on file     Family History:  The patient's family history includes Hyperlipidemia in his father; Multiple sclerosis in an other family member;  Ovarian cancer in his mother.   ROS:   Please see the history of present illness.    ROS All other systems are reviewed and are negative.  PHYSICAL EXAM:   VS:  BP 124/74   Pulse 72   Ht '5\' 9"'$  (1.753 m)   Wt 175 lb 6.4 oz (79.6 kg)   SpO2 96%   BMI 25.90 kg/m       General: Alert, oriented x3, no distress, remains very fit, but is now borderline overweight.  Appears younger than stated age. Head: no evidence of trauma, PERRL, EOMI, no exophtalmos or lid lag, no myxedema, no xanthelasma; normal ears, nose and oropharynx Neck: normal jugular venous pulsations and no hepatojugular reflux; brisk carotid pulses without delay and no carotid bruits Chest: clear to auscultation, no signs of consolidation by percussion or palpation, normal fremitus, symmetrical and full respiratory excursions Cardiovascular: normal position and quality of the apical impulse, regular rhythm, normal first and second heart sounds, no murmurs, rubs or gallops Abdomen: no tenderness or distention, no masses by palpation, no abnormal pulsatility or arterial bruits, normal bowel sounds, no hepatosplenomegaly Extremities: no clubbing, cyanosis or edema; 2+ radial, ulnar and brachial pulses bilaterally; 2+ right femoral, posterior tibial and dorsalis pedis pulses; 2+ left femoral, posterior tibial and dorsalis pedis pulses; no subclavian or femoral bruits Neurological: grossly nonfocal Psych: Normal mood and affect    Wt Readings from Last 3 Encounters:  10/03/21 175 lb 6.4 oz (79.6 kg)  05/02/21 175 lb  (79.4 kg)  04/17/21 177 lb (80.3 kg)      Studies/Labs Reviewed:   EKG:  EKG is ordered today.  It shows normal sinus rhythm, normal tracing  LABS: November 03, 2017 Normal liver function test, hemoglobin 15.2, normal thyroid tests Total cholesterol 166, HDL 42, LDL 104, triglycerides 104  01/09/2021 Hemoglobin 15.1, creatinine 0.94, potassium 4.8   Lipid Panel    Component Value Date/Time   CHOL 139 07/31/2020 0947   CHOL 180 05/26/2019 1030   TRIG 151 (H) 07/31/2020 0947   HDL 49 07/31/2020 0947   HDL 45 05/26/2019 1030   CHOLHDL 2.8 07/31/2020 0947   VLDL 30 07/31/2020 0947   LDLCALC 60 07/31/2020 0947   LDLCALC 109 (H) 05/26/2019 1030   LDLDIRECT 143.7 08/20/2012 0955     ASSESSMENT:    1. Paroxysmal atrial fibrillation (HCC)   2. Coronary artery disease involving native coronary artery of native heart without angina pectoris   3. Acquired thrombophilia ([place])   4. Essential hypertension   5. Mixed hyperlipidemia      PLAN:  In order of problems listed above:  PAFib: Excellent response to ablation without any detectable atrial fibrillation in over 12 months.  However, in view of his previous history of TIA I think he should continue anticoagulation, barring bleeding complications..  CHADSVasc 3-4 (TIA , CAD, questionable hypertension).  CAD: Asymptomatic.  The focus is on risk factor modification.  No meaningful discrete stenoses.  The only area of possible ischemia is in the very distal LAD, downstream of a slow diffuse tapering.  No indication for revascularization. Eliquis: No bleeding complications. HTN: Well-controlled. LA dilation on echo: Increases the likelihood of future atrial fibrillation events.  It may be related to hypertension, although there was no evidence of systolic or diastolic left ventricular dysfunction on echo. He might have an atrial myopathy. HLP: LDL at target less than 70 on combination rosuvastatin and Zetia.  It is quite likely that he  has heterozygous  familial hypercholesterolemia.  Has managed to bring his triglycerides down to close to target range with lifestyle changes.  Avoid weight gain.  Continue exercising.    Medication Adjustments/Labs and Tests Ordered: Current medicines are reviewed at length with the patient today.  Concerns regarding medicines are outlined above.  Medication changes, Labs and Tests ordered today are listed in the Patient Instructions below. Patient Instructions  .Medication Instructions:  No changes *If you need a refill on your cardiac medications before your next appointment, please call your pharmacy*   Lab Work: None ordered If you have labs (blood work) drawn today and your tests are completely normal, you will receive your results only by: [place] (if you have MyChart) OR A paper copy in the mail If you have any lab test that is abnormal or we need to change your treatment, we will call you to review the results.   Testing/Procedures: None ordered   Follow-Up: At [facility], you and your health needs are our priority.  As part of our continuing mission to provide you with exceptional heart care, we have created designated Provider Care Teams.  These Care Teams include your primary Cardiologist (physician) and Advanced Practice Providers (APPs -  Physician Assistants and Nurse Practitioners) who all work together to provide you with the care you need, when you need it.  We recommend signing up for the patient portal called "MyChart".  Sign up information is provided on this After Visit Summary.  MyChart is used to connect with patients for Virtual Visits (Telemedicine).  Patients are able to view lab/test results, encounter notes, upcoming appointments, etc.  Non-urgent messages can be sent to your provider as well.   To learn more about what you can do with MyChart, go to [email or web address].    Your next appointment:   12 month(s)  The format for your next  appointment:   In Person  Provider:   Sanda Klein, MD {    Important Information About Sugar         Signed, Sanda Klein, MD  10/07/2021 4:20 PM    [address], [place], [place]  [address] Phone: [phone]; Fax: [phone]

## 2021-10-08 DIAGNOSIS — M25511 Pain in right shoulder: Secondary | ICD-10-CM | POA: Diagnosis not present

## 2021-10-11 DIAGNOSIS — M25511 Pain in right shoulder: Secondary | ICD-10-CM | POA: Diagnosis not present

## 2021-10-15 DIAGNOSIS — M25511 Pain in right shoulder: Secondary | ICD-10-CM | POA: Diagnosis not present

## 2021-10-17 DIAGNOSIS — M25511 Pain in right shoulder: Secondary | ICD-10-CM | POA: Diagnosis not present

## 2021-10-19 ENCOUNTER — Other Ambulatory Visit: Payer: Self-pay | Admitting: Cardiovascular Disease

## 2021-10-21 DIAGNOSIS — M25511 Pain in right shoulder: Secondary | ICD-10-CM | POA: Diagnosis not present

## 2021-10-25 DIAGNOSIS — M25511 Pain in right shoulder: Secondary | ICD-10-CM | POA: Diagnosis not present

## 2021-10-28 DIAGNOSIS — M25511 Pain in right shoulder: Secondary | ICD-10-CM | POA: Diagnosis not present

## 2021-10-31 DIAGNOSIS — M25511 Pain in right shoulder: Secondary | ICD-10-CM | POA: Diagnosis not present

## 2021-11-05 DIAGNOSIS — M25511 Pain in right shoulder: Secondary | ICD-10-CM | POA: Diagnosis not present

## 2021-11-07 DIAGNOSIS — M25511 Pain in right shoulder: Secondary | ICD-10-CM | POA: Diagnosis not present

## 2021-11-11 DIAGNOSIS — M25511 Pain in right shoulder: Secondary | ICD-10-CM | POA: Diagnosis not present

## 2021-11-13 DIAGNOSIS — M25511 Pain in right shoulder: Secondary | ICD-10-CM | POA: Diagnosis not present

## 2021-11-13 DIAGNOSIS — B356 Tinea cruris: Secondary | ICD-10-CM | POA: Diagnosis not present

## 2021-11-18 DIAGNOSIS — M25511 Pain in right shoulder: Secondary | ICD-10-CM | POA: Diagnosis not present

## 2021-11-21 DIAGNOSIS — M25511 Pain in right shoulder: Secondary | ICD-10-CM | POA: Diagnosis not present

## 2021-11-25 DIAGNOSIS — M25511 Pain in right shoulder: Secondary | ICD-10-CM | POA: Diagnosis not present

## 2021-11-28 DIAGNOSIS — M25511 Pain in right shoulder: Secondary | ICD-10-CM | POA: Diagnosis not present

## 2021-12-03 DIAGNOSIS — M25511 Pain in right shoulder: Secondary | ICD-10-CM | POA: Diagnosis not present

## 2021-12-06 DIAGNOSIS — M25511 Pain in right shoulder: Secondary | ICD-10-CM | POA: Diagnosis not present

## 2021-12-10 DIAGNOSIS — M25511 Pain in right shoulder: Secondary | ICD-10-CM | POA: Diagnosis not present

## 2021-12-13 DIAGNOSIS — M25511 Pain in right shoulder: Secondary | ICD-10-CM | POA: Diagnosis not present

## 2021-12-17 DIAGNOSIS — M25511 Pain in right shoulder: Secondary | ICD-10-CM | POA: Diagnosis not present

## 2021-12-19 DIAGNOSIS — M25511 Pain in right shoulder: Secondary | ICD-10-CM | POA: Diagnosis not present

## 2021-12-24 DIAGNOSIS — M25511 Pain in right shoulder: Secondary | ICD-10-CM | POA: Diagnosis not present

## 2021-12-27 DIAGNOSIS — M25511 Pain in right shoulder: Secondary | ICD-10-CM | POA: Diagnosis not present

## 2021-12-31 DIAGNOSIS — M25511 Pain in right shoulder: Secondary | ICD-10-CM | POA: Diagnosis not present

## 2022-01-03 DIAGNOSIS — M25511 Pain in right shoulder: Secondary | ICD-10-CM | POA: Diagnosis not present

## 2022-01-08 DIAGNOSIS — M25511 Pain in right shoulder: Secondary | ICD-10-CM | POA: Diagnosis not present

## 2022-01-09 DIAGNOSIS — M546 Pain in thoracic spine: Secondary | ICD-10-CM | POA: Diagnosis not present

## 2022-01-10 DIAGNOSIS — M25511 Pain in right shoulder: Secondary | ICD-10-CM | POA: Diagnosis not present

## 2022-01-17 IMAGING — MR MR KNEE*R* W/O CM
5 of 7 series · 24 of 40 positions shown · non-contrast
Comparison: Right knee x-rays dated October 18, 2020.

CLINICAL DATA: Right knee pain for the past 3 months. History of
prior ACL repair.

EXAM:
MRI OF THE RIGHT KNEE WITHOUT CONTRAST
TECHNIQUE: Multiplanar, multisequence MR imaging of the knee was performed. No
intravenous contrast was administered.

[Series 3: T2 fat-sat · axial · 4.0mm · 0.62mm/px · z∈[-63,+66]mm · 7 of 27 slices shown (1 of 2)]
[im 1/27]
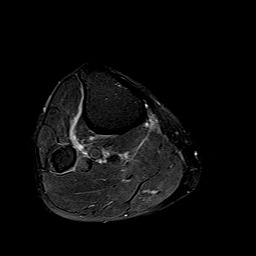
[im 5/27]
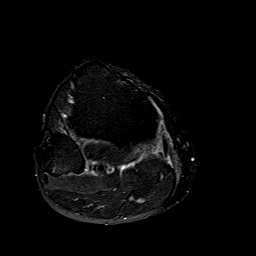
[im 9/27]
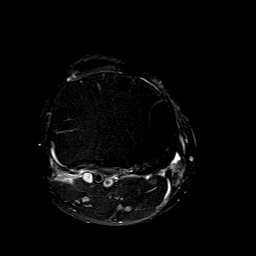
[im 14/27]
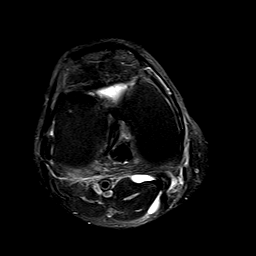
[im 18/27]
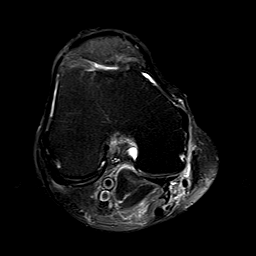
[im 22/27]
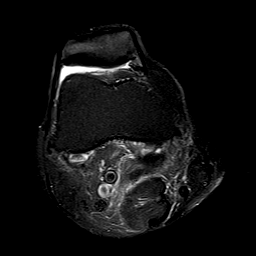
[im 27/27]
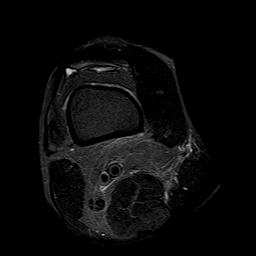

[Series 5: T2 fat-sat · coronal · 4.0mm · 0.29mm/px · 2 of 29 slices shown (2 of 2)]
[im 1/29]
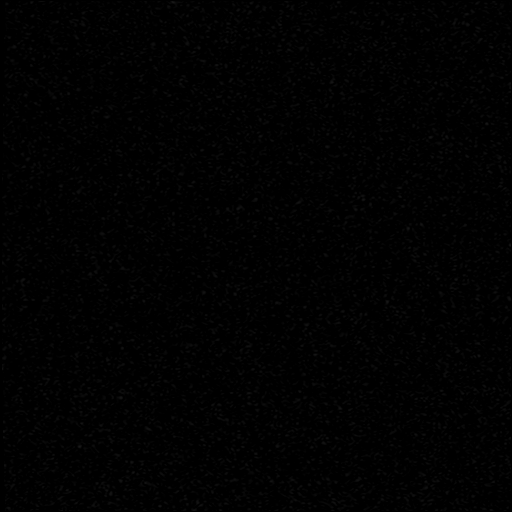
[im 6/29]
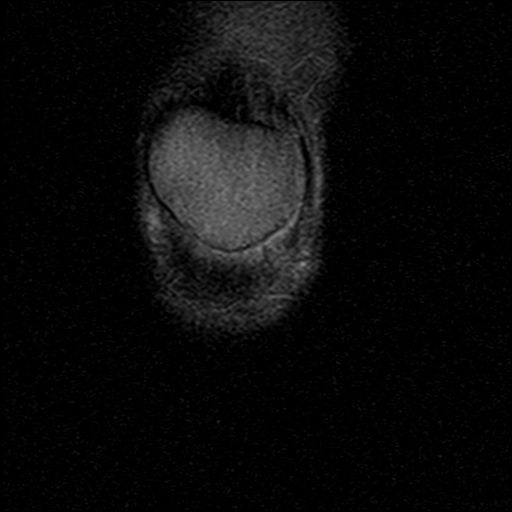

[Series 7: PD fat-sat · sagittal · 3.0mm · 0.29mm/px · 6 of 29 slices shown (1 of 3)]
[im 1/29]
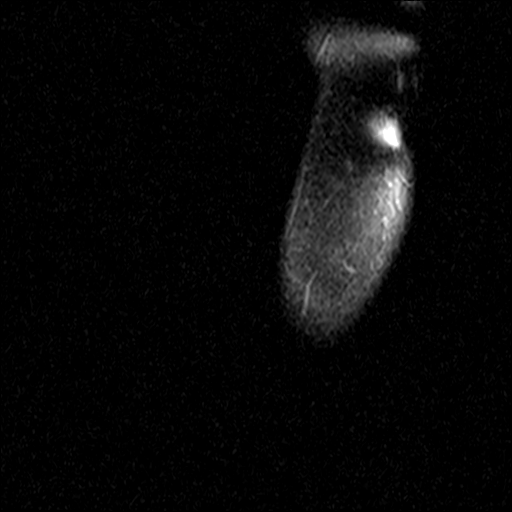
[im 6/29]
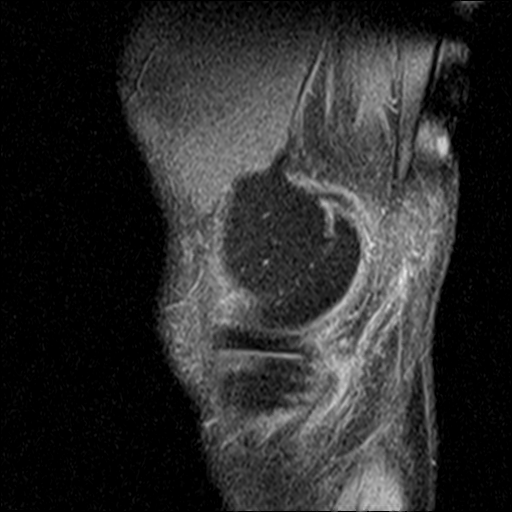
[im 12/29]
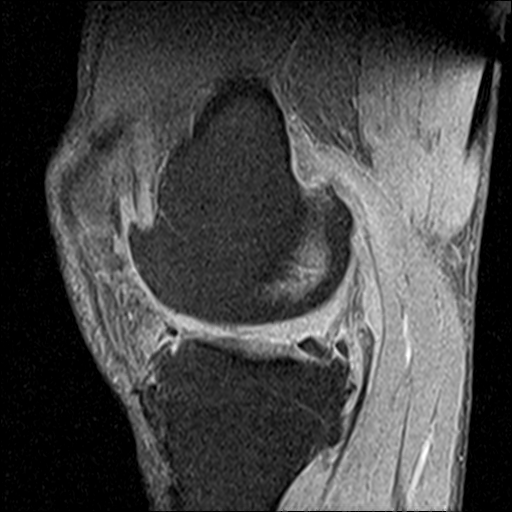
[im 17/29]
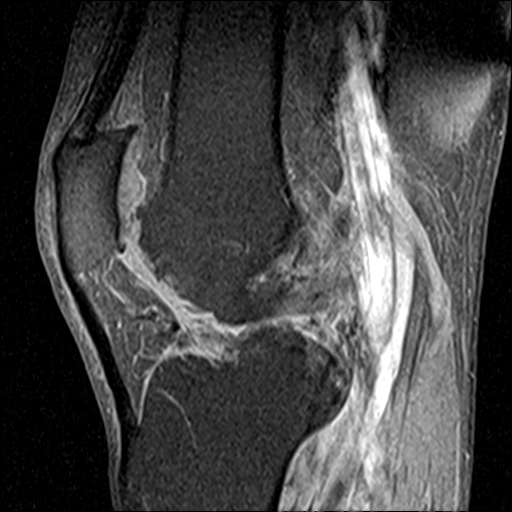
[im 23/29]
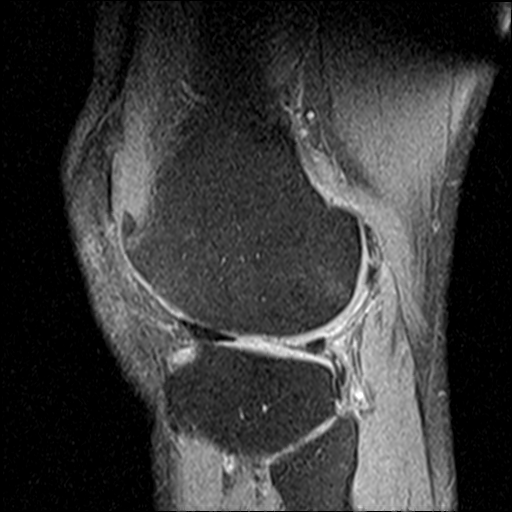
[im 29/29]
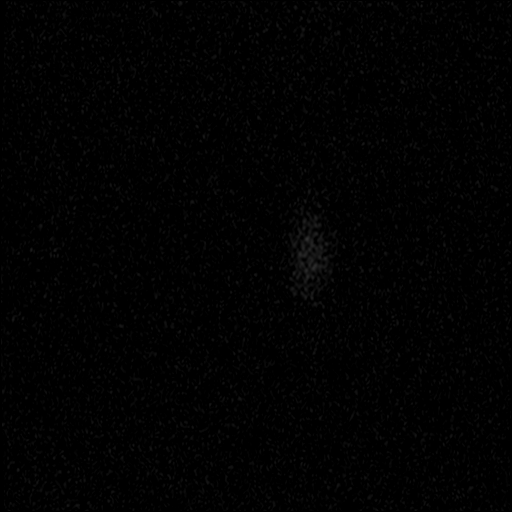

[Series 8: PD fat-sat · coronal · 3.0mm · 0.29mm/px · 7 of 34 slices shown (2 of 3)]
[im 1/34]
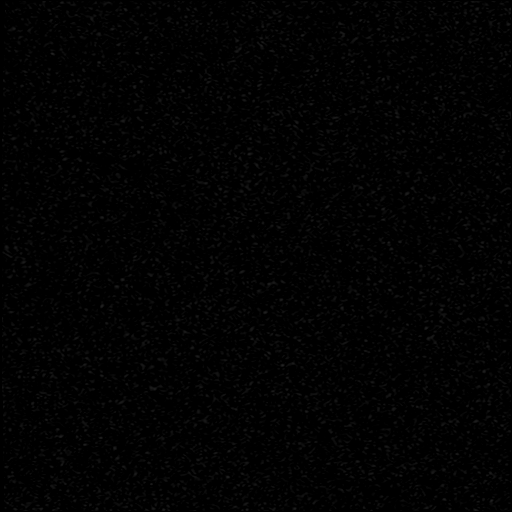
[im 6/34]
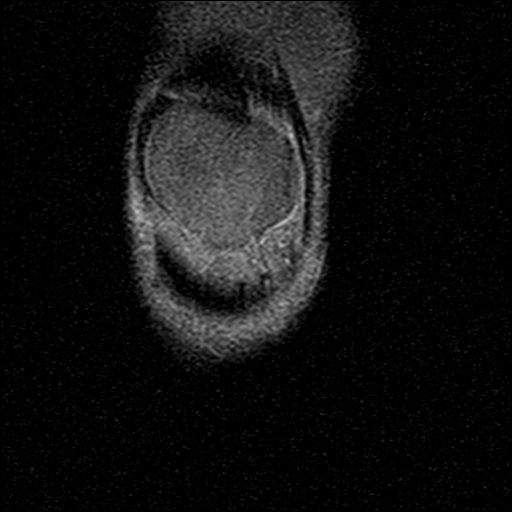
[im 12/34]
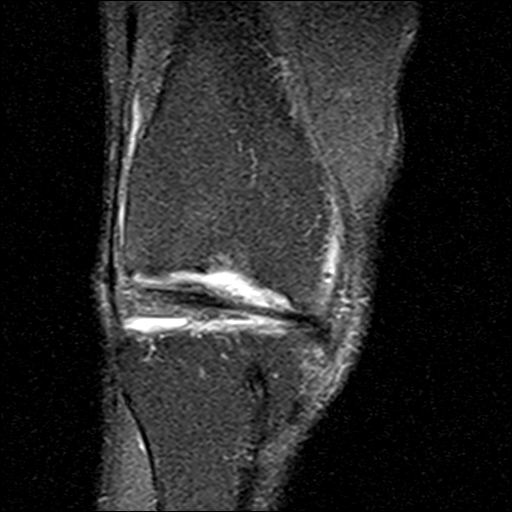
[im 17/34]
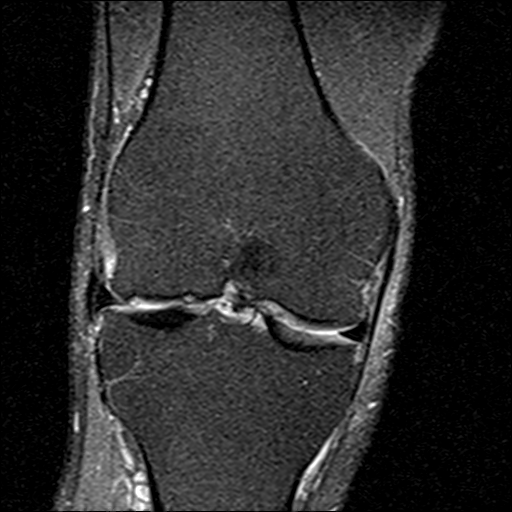
[im 23/34]
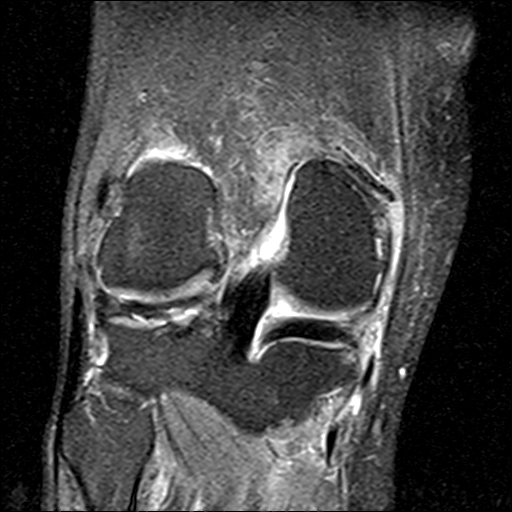
[im 28/34]
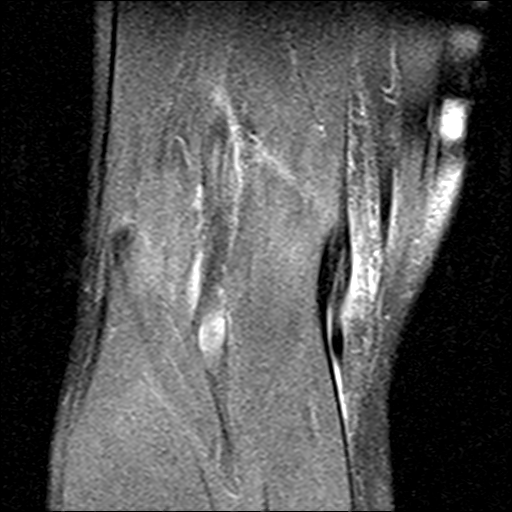
[im 34/34]
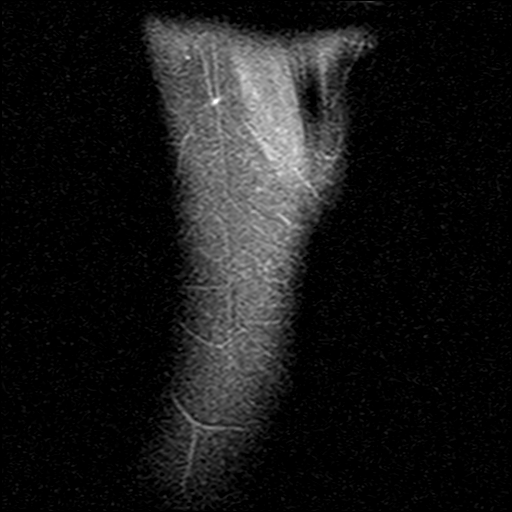

[Series 9: PD fat-sat · axial · 2.0mm · 0.29mm/px · z∈[-31,-13]mm · 2 of 11 slices shown (3 of 3)]
[im 1/11]
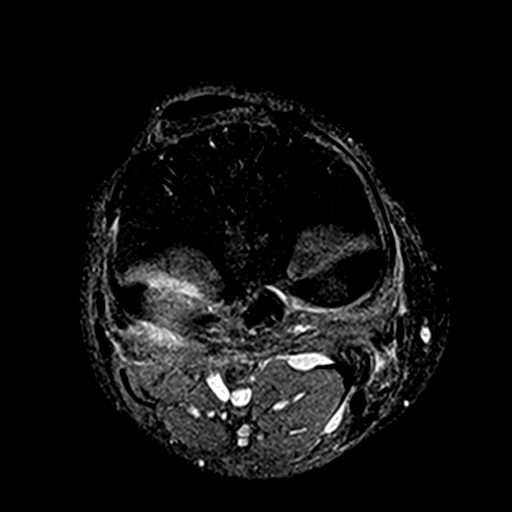
[im 11/11]
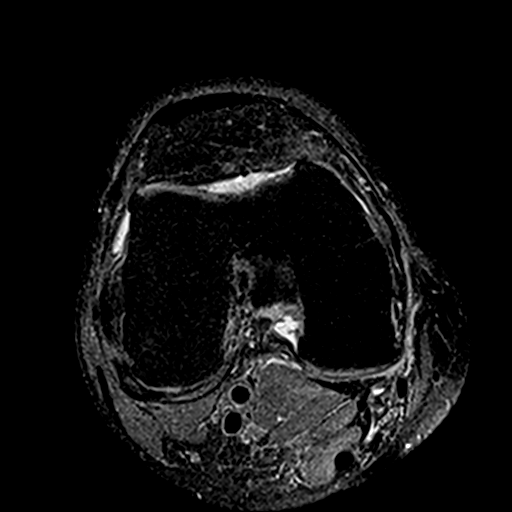

[24 of 40 positions shown; findings below may reference images not displayed]

FINDINGS: MENISCI

Medial meniscus:  Intact.

Lateral meniscus: Small oblique tear of the anterior horn (series 7,
image 10). Complex tear of the posterior horn (series 7, images
9-10). Blunting of the posterior horn free edge.

LIGAMENTS

Cruciates: Prior ACL repair with diminutive graft. The PCL is
intact.

Collaterals: Medial collateral ligament is intact. Lateral
collateral ligament complex is intact.

CARTILAGE

Patellofemoral: High-grade partial full-thickness cartilage loss
over the trochlea.

Medial:  No chondral defect.

Lateral: Mild partial-thickness cartilage loss over the central
weight-bearing lateral femoral condyle.

Joint:  Trace joint effusion.  Normal Hoffa's fat.

Popliteal Fossa:  Small Baker cyst.  Intact popliteus tendon.

Extensor Mechanism: Intact quadriceps tendon and patellar tendon.
Intact medial and lateral patellar retinaculum. Intact MPFL.

Bones: No acute fracture or dislocation. No suspicious bone lesion.

Other: None.
IMPRESSION: 1. Small oblique tear of the lateral meniscus anterior horn.
Additional complex tear of the posterior horn. Blunting of the
posterior horn free edge may be related to prior partial
meniscectomy.
2. Prior ACL repair with diminutive graft.
3. Mild lateral and patellofemoral compartment osteoarthritis.
4. Small Baker cyst.

## 2022-02-17 DIAGNOSIS — H2513 Age-related nuclear cataract, bilateral: Secondary | ICD-10-CM | POA: Diagnosis not present

## 2022-02-17 DIAGNOSIS — H401131 Primary open-angle glaucoma, bilateral, mild stage: Secondary | ICD-10-CM | POA: Diagnosis not present

## 2022-02-18 DIAGNOSIS — E7800 Pure hypercholesterolemia, unspecified: Secondary | ICD-10-CM | POA: Diagnosis not present

## 2022-02-18 DIAGNOSIS — I2510 Atherosclerotic heart disease of native coronary artery without angina pectoris: Secondary | ICD-10-CM | POA: Diagnosis not present

## 2022-02-18 DIAGNOSIS — Z0000 Encounter for general adult medical examination without abnormal findings: Secondary | ICD-10-CM | POA: Diagnosis not present

## 2022-02-18 DIAGNOSIS — Z8673 Personal history of transient ischemic attack (TIA), and cerebral infarction without residual deficits: Secondary | ICD-10-CM | POA: Diagnosis not present

## 2022-02-18 DIAGNOSIS — H40009 Preglaucoma, unspecified, unspecified eye: Secondary | ICD-10-CM | POA: Diagnosis not present

## 2022-02-18 DIAGNOSIS — I4891 Unspecified atrial fibrillation: Secondary | ICD-10-CM | POA: Diagnosis not present

## 2022-02-18 DIAGNOSIS — I10 Essential (primary) hypertension: Secondary | ICD-10-CM | POA: Diagnosis not present

## 2022-02-18 DIAGNOSIS — D6869 Other thrombophilia: Secondary | ICD-10-CM | POA: Diagnosis not present

## 2022-02-18 DIAGNOSIS — K219 Gastro-esophageal reflux disease without esophagitis: Secondary | ICD-10-CM | POA: Diagnosis not present

## 2022-02-27 DIAGNOSIS — M549 Dorsalgia, unspecified: Secondary | ICD-10-CM | POA: Diagnosis not present

## 2022-02-27 DIAGNOSIS — Z23 Encounter for immunization: Secondary | ICD-10-CM | POA: Diagnosis not present

## 2022-03-17 DIAGNOSIS — D6869 Other thrombophilia: Secondary | ICD-10-CM | POA: Diagnosis not present

## 2022-03-17 DIAGNOSIS — I10 Essential (primary) hypertension: Secondary | ICD-10-CM | POA: Diagnosis not present

## 2022-03-17 DIAGNOSIS — E236 Other disorders of pituitary gland: Secondary | ICD-10-CM | POA: Diagnosis not present

## 2022-03-17 DIAGNOSIS — Z8673 Personal history of transient ischemic attack (TIA), and cerebral infarction without residual deficits: Secondary | ICD-10-CM | POA: Diagnosis not present

## 2022-03-17 DIAGNOSIS — M549 Dorsalgia, unspecified: Secondary | ICD-10-CM | POA: Diagnosis not present

## 2022-03-17 DIAGNOSIS — E7800 Pure hypercholesterolemia, unspecified: Secondary | ICD-10-CM | POA: Diagnosis not present

## 2022-05-08 DIAGNOSIS — L821 Other seborrheic keratosis: Secondary | ICD-10-CM | POA: Diagnosis not present

## 2022-05-08 DIAGNOSIS — B078 Other viral warts: Secondary | ICD-10-CM | POA: Diagnosis not present

## 2022-05-08 DIAGNOSIS — D1801 Hemangioma of skin and subcutaneous tissue: Secondary | ICD-10-CM | POA: Diagnosis not present

## 2022-05-08 DIAGNOSIS — L812 Freckles: Secondary | ICD-10-CM | POA: Diagnosis not present

## 2022-06-03 DIAGNOSIS — M25511 Pain in right shoulder: Secondary | ICD-10-CM | POA: Diagnosis not present

## 2022-06-03 DIAGNOSIS — Z4789 Encounter for other orthopedic aftercare: Secondary | ICD-10-CM | POA: Diagnosis not present

## 2022-06-16 ENCOUNTER — Other Ambulatory Visit: Payer: Self-pay | Admitting: Internal Medicine

## 2022-06-16 DIAGNOSIS — E236 Other disorders of pituitary gland: Secondary | ICD-10-CM

## 2022-06-17 DIAGNOSIS — R278 Other lack of coordination: Secondary | ICD-10-CM | POA: Diagnosis not present

## 2022-06-17 DIAGNOSIS — R293 Abnormal posture: Secondary | ICD-10-CM | POA: Diagnosis not present

## 2022-06-17 DIAGNOSIS — M531 Cervicobrachial syndrome: Secondary | ICD-10-CM | POA: Diagnosis not present

## 2022-06-17 DIAGNOSIS — M25611 Stiffness of right shoulder, not elsewhere classified: Secondary | ICD-10-CM | POA: Diagnosis not present

## 2022-06-17 DIAGNOSIS — M25511 Pain in right shoulder: Secondary | ICD-10-CM | POA: Diagnosis not present

## 2022-06-19 DIAGNOSIS — R293 Abnormal posture: Secondary | ICD-10-CM | POA: Diagnosis not present

## 2022-06-19 DIAGNOSIS — M25611 Stiffness of right shoulder, not elsewhere classified: Secondary | ICD-10-CM | POA: Diagnosis not present

## 2022-06-19 DIAGNOSIS — R278 Other lack of coordination: Secondary | ICD-10-CM | POA: Diagnosis not present

## 2022-06-19 DIAGNOSIS — M25511 Pain in right shoulder: Secondary | ICD-10-CM | POA: Diagnosis not present

## 2022-06-19 DIAGNOSIS — M531 Cervicobrachial syndrome: Secondary | ICD-10-CM | POA: Diagnosis not present

## 2022-06-20 DIAGNOSIS — E039 Hypothyroidism, unspecified: Secondary | ICD-10-CM | POA: Diagnosis not present

## 2022-06-23 DIAGNOSIS — R278 Other lack of coordination: Secondary | ICD-10-CM | POA: Diagnosis not present

## 2022-06-23 DIAGNOSIS — M25611 Stiffness of right shoulder, not elsewhere classified: Secondary | ICD-10-CM | POA: Diagnosis not present

## 2022-06-23 DIAGNOSIS — M25511 Pain in right shoulder: Secondary | ICD-10-CM | POA: Diagnosis not present

## 2022-06-23 DIAGNOSIS — R293 Abnormal posture: Secondary | ICD-10-CM | POA: Diagnosis not present

## 2022-06-23 DIAGNOSIS — M531 Cervicobrachial syndrome: Secondary | ICD-10-CM | POA: Diagnosis not present

## 2022-06-25 ENCOUNTER — Ambulatory Visit
Admission: RE | Admit: 2022-06-25 | Discharge: 2022-06-25 | Disposition: A | Discharge status: Home / Self Care | Payer: Medicare Other | Source: Ambulatory Visit | Attending: Internal Medicine | Admitting: Internal Medicine

## 2022-06-25 DIAGNOSIS — E236 Other disorders of pituitary gland: Secondary | ICD-10-CM

## 2022-06-25 MED ORDER — GADOPICLENOL 0.5 MMOL/ML IV SOLN
6.0000 mL | Freq: Once | INTRAVENOUS | Status: AC | PRN
  Administered 2022-06-25: 6 mL via INTRAVENOUS

## 2022-06-26 DIAGNOSIS — R278 Other lack of coordination: Secondary | ICD-10-CM | POA: Diagnosis not present

## 2022-06-26 DIAGNOSIS — M531 Cervicobrachial syndrome: Secondary | ICD-10-CM | POA: Diagnosis not present

## 2022-06-26 DIAGNOSIS — M25611 Stiffness of right shoulder, not elsewhere classified: Secondary | ICD-10-CM | POA: Diagnosis not present

## 2022-06-26 DIAGNOSIS — R293 Abnormal posture: Secondary | ICD-10-CM | POA: Diagnosis not present

## 2022-06-26 DIAGNOSIS — M25511 Pain in right shoulder: Secondary | ICD-10-CM | POA: Diagnosis not present

## 2022-07-01 DIAGNOSIS — M531 Cervicobrachial syndrome: Secondary | ICD-10-CM | POA: Diagnosis not present

## 2022-07-01 DIAGNOSIS — M25611 Stiffness of right shoulder, not elsewhere classified: Secondary | ICD-10-CM | POA: Diagnosis not present

## 2022-07-01 DIAGNOSIS — R293 Abnormal posture: Secondary | ICD-10-CM | POA: Diagnosis not present

## 2022-07-01 DIAGNOSIS — M25511 Pain in right shoulder: Secondary | ICD-10-CM | POA: Diagnosis not present

## 2022-07-01 DIAGNOSIS — R278 Other lack of coordination: Secondary | ICD-10-CM | POA: Diagnosis not present

## 2022-07-03 DIAGNOSIS — R278 Other lack of coordination: Secondary | ICD-10-CM | POA: Diagnosis not present

## 2022-07-03 DIAGNOSIS — M25611 Stiffness of right shoulder, not elsewhere classified: Secondary | ICD-10-CM | POA: Diagnosis not present

## 2022-07-03 DIAGNOSIS — R293 Abnormal posture: Secondary | ICD-10-CM | POA: Diagnosis not present

## 2022-07-03 DIAGNOSIS — M531 Cervicobrachial syndrome: Secondary | ICD-10-CM | POA: Diagnosis not present

## 2022-07-03 DIAGNOSIS — M25511 Pain in right shoulder: Secondary | ICD-10-CM | POA: Diagnosis not present

## 2022-07-07 DIAGNOSIS — M25611 Stiffness of right shoulder, not elsewhere classified: Secondary | ICD-10-CM | POA: Diagnosis not present

## 2022-07-07 DIAGNOSIS — M531 Cervicobrachial syndrome: Secondary | ICD-10-CM | POA: Diagnosis not present

## 2022-07-07 DIAGNOSIS — R278 Other lack of coordination: Secondary | ICD-10-CM | POA: Diagnosis not present

## 2022-07-07 DIAGNOSIS — R293 Abnormal posture: Secondary | ICD-10-CM | POA: Diagnosis not present

## 2022-07-07 DIAGNOSIS — M25511 Pain in right shoulder: Secondary | ICD-10-CM | POA: Diagnosis not present

## 2022-07-10 DIAGNOSIS — M531 Cervicobrachial syndrome: Secondary | ICD-10-CM | POA: Diagnosis not present

## 2022-07-10 DIAGNOSIS — M25511 Pain in right shoulder: Secondary | ICD-10-CM | POA: Diagnosis not present

## 2022-07-10 DIAGNOSIS — R278 Other lack of coordination: Secondary | ICD-10-CM | POA: Diagnosis not present

## 2022-07-10 DIAGNOSIS — R293 Abnormal posture: Secondary | ICD-10-CM | POA: Diagnosis not present

## 2022-07-10 DIAGNOSIS — M25611 Stiffness of right shoulder, not elsewhere classified: Secondary | ICD-10-CM | POA: Diagnosis not present

## 2022-07-15 DIAGNOSIS — R278 Other lack of coordination: Secondary | ICD-10-CM | POA: Diagnosis not present

## 2022-07-15 DIAGNOSIS — M531 Cervicobrachial syndrome: Secondary | ICD-10-CM | POA: Diagnosis not present

## 2022-07-15 DIAGNOSIS — M25511 Pain in right shoulder: Secondary | ICD-10-CM | POA: Diagnosis not present

## 2022-07-15 DIAGNOSIS — R293 Abnormal posture: Secondary | ICD-10-CM | POA: Diagnosis not present

## 2022-07-15 DIAGNOSIS — M25611 Stiffness of right shoulder, not elsewhere classified: Secondary | ICD-10-CM | POA: Diagnosis not present

## 2022-08-07 DIAGNOSIS — L820 Inflamed seborrheic keratosis: Secondary | ICD-10-CM | POA: Diagnosis not present

## 2022-08-07 DIAGNOSIS — L859 Epidermal thickening, unspecified: Secondary | ICD-10-CM | POA: Diagnosis not present

## 2022-08-25 DIAGNOSIS — H401131 Primary open-angle glaucoma, bilateral, mild stage: Secondary | ICD-10-CM | POA: Diagnosis not present

## 2022-08-25 DIAGNOSIS — E119 Type 2 diabetes mellitus without complications: Secondary | ICD-10-CM | POA: Diagnosis not present

## 2022-08-25 DIAGNOSIS — H2513 Age-related nuclear cataract, bilateral: Secondary | ICD-10-CM | POA: Diagnosis not present

## 2023-01-05 ENCOUNTER — Ambulatory Visit: Payer: Medicare Other | Admitting: Podiatry

## 2023-02-02 ENCOUNTER — Encounter: Payer: Self-pay | Admitting: Cardiovascular Disease

## 2023-02-02 ENCOUNTER — Ambulatory Visit: Payer: Medicare Other | Attending: Cardiovascular Disease | Admitting: Cardiovascular Disease

## 2023-02-02 VITALS — BP 132/68 | HR 61 | Ht 69.0 in | Wt 167.8 lb

## 2023-02-02 DIAGNOSIS — I480 Paroxysmal atrial fibrillation: Secondary | ICD-10-CM

## 2023-02-02 DIAGNOSIS — I2510 Atherosclerotic heart disease of native coronary artery without angina pectoris: Secondary | ICD-10-CM

## 2023-02-02 DIAGNOSIS — D6869 Other thrombophilia: Secondary | ICD-10-CM | POA: Diagnosis not present

## 2023-02-02 DIAGNOSIS — I10 Essential (primary) hypertension: Secondary | ICD-10-CM

## 2023-02-02 DIAGNOSIS — E782 Mixed hyperlipidemia: Secondary | ICD-10-CM

## 2023-02-02 NOTE — Patient Instructions (Signed)

## 2023-02-02 NOTE — Progress Notes (Signed)
Cardiology Office  Note    Date:  02/02/2023   ID:  Michael Lambert, DOB 1955/10/22, MRN [id number]  PCP:  Michael Heys, MD  Cardiologist:   Michael Fair, MD   No chief complaint on file.   History of Present Illness:  Michael Lambert is a 67 y.o. male with paroxysmal atrial fibrillation and history of TIA, moderate CAD by coronary CT angiography.  He has had a remarkably successful reduction in atrial fibrillation burden following ablation in February 2022.  No atrial fibrillation has been detected on his loop recorder since March 2022 (Device reached RRT in May 2022).  He has not been troubled much by arrhythmia.  He has rare palpitations that last just a few seconds.  They usually occur at rest, frequently at night.  He does not think he has had any sustained episodes of atrial fibrillation.  Before his ablation his loop recorder often showed nocturnal episodes of atrial fibrillation that were asymptomatic.  He has had a variety of joint problems, especially in both shoulders where he has had surgery.  He still getting physical therapy and recovery has been slow.  Statin holiday did not help.  His pain is greatly relieved when he takes NSAIDs, but he has to avoid taking them since he is on chronic anticoagulation.  Nevertheless has been riding his mountain bicycle relatively frequently.  He takes longer breaks than usual.  He is interested in a Watchman device so that he can take NSAIDs.  Otherwise, he has not had falls, injuries or serious bleeding problems while on Eliquis.  He had a TIA in May 2018 and he has not had any new events since then.  Previous TEE does show a very small PFO.  On rosuvastatin his recent LDL cholesterol was 50.  Coronary CT angiogram 12/15/2018 showed a calcium score at the 56th percentile had no significant coronary stenoses.  There was a moderate lesion right coronary artery ostium (50-69%.  FFR analysis showed a slow diffuse tapering of the LAD artery with the FFR  in the distal vessel of 0.78 (2-2.5 mm vessel at that point).  The ostial RCA was not hemodynamically significant (FFR 0.95).  He experienced a transient ischemic attack in May 2018 and an implantable loop recorder showed that he does have brief episodes of atrial fibrillation.  He does not have any known structural heart disease, but does have mild left atrial dilation (end-systolic diameter of 41 mm) and a very small PFO.  He has a history of GERD that is well-controlled with PPI.  He does not have a family history of cardiac disease. His brother also has high cholesterol but has not had coronary other vascular problems by the age of 16. Unfortunately both Michael Lambert's parents died relatively young from ovarian cancer and multiple sclerosis respectively.  Past Medical History:  Diagnosis Date   Anxiety    sees Dr. Phillip Heal    Atrial fibrillation [facility])    on Xarelto daily   Benign prostatic hypertrophy    GERD (gastroesophageal reflux disease)    Hyperlipidemia    Migraine    Stroke (HCC)    TIA memorial day 2018    Past Surgical History:  Procedure Laterality Date   APPENDECTOMY     ATRIAL FIBRILLATION ABLATION N/A 07/03/2020   Procedure: ATRIAL FIBRILLATION ABLATION;  Surgeon: Hillis Range, MD;  [facility];  Service: Cardiovascular;  Laterality: N/A;   colonoscopy  10/08/2012   per Dr. Arlyce Dice, adenomatous polyps, repeat  in 5 yrs    COLONOSCOPY     CYSTOSCOPY     per Dr. Earlene Plater-    gum graft  08/2009   Dr Juluis Pitch    HERNIA REPAIR  2008   bilateral inguinal, per Dr. Abbey Chatters   implantable loop recorder removal  04/17/2021   MDT Reveal LINQ removed   LOOP RECORDER INSERTION N/A 11/18/2016   Procedure: Loop Recorder Insertion;  Surgeon: Hillis Range, MD;  Location: [facility];  Service: Cardiovascular;  Laterality: N/A;   PALATE / UVULA BIOPSY / EXCISION  2004   per Dr. Ezzard Standing, for snoring    RADIAL KERATOTOMY     sees Dr. Ladoris Gene as FU  at  [facility] , bilateral,  dr stonecipher did the RK    repair detached retina     TEE WITHOUT CARDIOVERSION N/A 11/18/2016   Procedure: TRANSESOPHAGEAL ECHOCARDIOGRAM (TEE);  Surgeon: Vesta Mixer, MD;  Location: [facility] ENDOSCOPY;  Service: Cardiovascular;  Laterality: N/A;   TONSILLECTOMY      Current Medications: Outpatient Medications Prior to Visit  Medication Sig Dispense Refill   acetaminophen (TYLENOL) 500 MG tablet Take 500 mg by mouth every 6 (six) hours as needed for mild pain or moderate pain.     apixaban (ELIQUIS) 5 MG TABS tablet Take 5 mg by mouth 2 (two) times daily.     cholecalciferol (VITAMIN D) 1000 units tablet Take 1,000 Units by mouth daily.     esomeprazole (NEXIUM) 20 MG capsule Take 20 mg by mouth daily at 12 noon.     ezetimibe (ZETIA) 10 MG tablet TAKE ONE TABLET BY MOUTH DAILY 90 tablet 3   latanoprost (XALATAN) 0.005 % ophthalmic solution Place 1 drop into both eyes at bedtime.     levothyroxine (SYNTHROID) 25 MCG tablet Take 25 mcg by mouth daily.     metoprolol succinate (TOPROL-XL) 25 MG 24 hr tablet Take 1.5 tablets (37.5 mg total) by mouth daily. (Patient taking differently: Take 25 mg by mouth daily.) 45 tablet 11   rosuvastatin (CRESTOR) 40 MG tablet Take 40 mg by mouth daily.     sertraline (ZOLOFT) 25 MG tablet Take 25 mg by mouth daily.     No facility-administered medications prior to visit.     Allergies:   Patient has no known allergies.   Social History   Socioeconomic History   Marital status: Married    Spouse name: Not on file   Number of children: Not on file   Years of education: Not on file   Highest education level: Not on file  Occupational History   Not on file  Tobacco Use   Smoking status: Former    Current packs/day: 0.00    Types: Cigarettes    Quit date: 05/19/1980    Years since quitting: 42.7   Smokeless tobacco: Never  Vaping Use   Vaping status: Never Used  Substance and Sexual Activity   Alcohol use: Yes     Alcohol/week: 2.0 standard drinks of alcohol    Types: 1 Glasses of wine, 1 Cans of beer per week    Comment: 2 glasses of wine each day   Drug use: No   Sexual activity: Not on file  Other Topics Concern   Not on file  Social History Narrative   Not on file   Social Determinants of Health   Financial Resource Strain: Not on file  Food Insecurity: Not on file  Transportation Needs: Not on file  Physical Activity: Not on file  Stress: Not on file  Social Connections: Not on file     Family History:  The patient's family history includes Hyperlipidemia in his father; Multiple sclerosis in an other family member; Ovarian cancer in his mother.   ROS:   Please see the history of present illness.    ROS All other systems are reviewed and are negative.  PHYSICAL EXAM:   VS:  BP 132/68 (BP Location: Left Arm, Patient Position: Sitting, Cuff Size: Normal)   Pulse 61   Ht 5\' 9"  (1.753 m)   Wt 167 lb 12.8 oz (76.1 kg)   SpO2 98%   BMI 24.78 kg/m      General: Alert, oriented x3, no distress, he looks much younger than his stated age. Head: no evidence of trauma, PERRL, EOMI, no exophtalmos or lid lag, no myxedema, no xanthelasma; normal ears, nose and oropharynx Neck: normal jugular venous pulsations and no hepatojugular reflux; brisk carotid pulses without delay and no carotid bruits Chest: clear to auscultation, no signs of consolidation by percussion or palpation, normal fremitus, symmetrical and full respiratory excursions Cardiovascular: normal position and quality of the apical impulse, regular rhythm, normal first and second heart sounds, no murmurs, rubs or gallops Abdomen: no tenderness or distention, no masses by palpation, no abnormal pulsatility or arterial bruits, normal bowel sounds, no hepatosplenomegaly Extremities: no clubbing, cyanosis or edema; 2+ radial, ulnar and brachial pulses bilaterally; 2+ right femoral, posterior tibial and dorsalis pedis pulses; 2+ left  femoral, posterior tibial and dorsalis pedis pulses; no subclavian or femoral bruits Neurological: grossly nonfocal Psych: Normal mood and affect    Wt Readings from Last 3 Encounters:  02/02/23 167 lb 12.8 oz (76.1 kg)  10/03/21 175 lb 6.4 oz (79.6 kg)  05/02/21 175 lb (79.4 kg)      Studies/Labs Reviewed:   EKG:  EKG is ordered today.  It shows mild sinus bradycardia, otherwise normal tracing.  LABS: November 03, 2017 Normal liver function test, hemoglobin 15.2, normal thyroid tests Total cholesterol 166, HDL 42, LDL 104, triglycerides 104  01/09/2021 Hemoglobin 15.1, creatinine 0.94, potassium 4.8   Lipid Panel    Component Value Date/Time   CHOL 139 07/31/2020 0947   CHOL 180 05/26/2019 1030   TRIG 151 (H) 07/31/2020 0947   HDL 49 07/31/2020 0947   HDL 45 05/26/2019 1030   CHOLHDL 2.8 07/31/2020 0947   VLDL 30 07/31/2020 0947   LDLCALC 60 07/31/2020 0947   LDLCALC 109 (H) 05/26/2019 1030   LDLDIRECT 143.7 08/20/2012 0955     ASSESSMENT:    1. Paroxysmal atrial fibrillation (HCC)   2. Acquired thrombophilia (HCC)   3. Coronary artery disease involving native coronary artery of native heart without angina pectoris   4. Essential hypertension   5. Mixed hyperlipidemia      PLAN:  In order of problems listed above:  PAFib: He has had an excellent clinical response to ablation. CHADSVasc 4-5 (age, TIA , CAD, questionable hypertension).  Anticoagulation: He has tolerated Eliquis well, but this prevents him from taking NSAIDs which he needs for a variety of degenerative joint problems.  He is interested in a Watchman device. I have seen Desi Stacey is a 67 y.o. male in the office today who is being considered for a Watchman left atrial appendage closure device.  He has a history of paroxysmal atrial fibrillation.  This patients CHA2DS2-VASc Score and unadjusted Ischemic Stroke Rate (% per year) is equal to 7.2 %  stroke rate/year from a score of 5 which necessitates  long term oral anticoagulation to prevent stroke.  Unfortunately, He is not felt to be a long term anticoagulation candidate secondary to risk of bleeding with NSAIDs, required for degenerative joint disease.  The patients chart has been reviewed and I feel that they would be a candidate for short term oral anticoagulation.  Procedural risks for the Watchman implant have been reviewed with the patient including a 1% risk of stroke, 2% risk of perforation, 0.1% risk of device embolization.  Given the patient's poor candidacy for long-term oral anticoagulation and ability to tolerate short term oral anticoagulation I have recommended the watchman left atrial appendage closure system.  CAD: He does not have angina pectoris despite being very physically active.  The focus is on risk factor modification.  No meaningful discrete stenoses.  The only area of possible ischemia is in the very distal LAD, downstream of a slow diffuse tapering.  No indication for revascularization. HTN: Well-controlled on a low-dose of metoprolol. LA dilation on echo: Increases the likelihood of future atrial fibrillation events.  It may be related to hypertension, although there was no evidence of systolic or diastolic left ventricular dysfunction on echo. He might have an atrial myopathy. HLP: All his lipid parameters are at target, other than borderline low HDL, on combination rosuvastatin and Zetia.  It is quite likely that he has heterozygous familial hypercholesterolemia.  Has managed to bring his triglycerides down to close to target range with lifestyle changes.  Avoid weight gain.  Continue exercising.    Medication Adjustments/Labs and Tests Ordered: Current medicines are reviewed at length with the patient today.  Concerns regarding medicines are outlined above.  Medication changes, Labs and Tests ordered today are listed in the Patient Instructions below. Patient Instructions  Medication Instructions:  No changes *If  you need a refill on your cardiac medications before your next appointment, please call your pharmacy*  Follow-Up: At [facility], you and your health needs are our priority.  As part of our continuing mission to provide you with exceptional heart care, we have created designated Provider Care Teams.  These Care Teams include your primary Cardiologist (physician) and Advanced Practice Providers (APPs -  Physician Assistants and Nurse Practitioners) who all work together to provide you with the care you need, when you need it.  We recommend signing up for the patient portal called "MyChart".  Sign up information is provided on this After Visit Summary.  MyChart is used to connect with patients for Virtual Visits (Telemedicine).  Patients are able to view lab/test results, encounter notes, upcoming appointments, etc.  Non-urgent messages can be sent to your provider as well.   To learn more about what you can do with MyChart, go to [email or web address].    Your next appointment:   1 year(s)  Provider:   Thurmon Fair, MD       Signed, Michael Fair, MD  02/02/2023 1:01 PM    [facility] [address] [place], [place], [place]  [address] Phone: [phone]; Fax: [phone]

## 2023-02-06 ENCOUNTER — Telehealth: Payer: Self-pay | Admitting: Cardiology

## 2023-02-06 NOTE — Telephone Encounter (Signed)
Received Watchman referral from Dr. Royann Shivers. Attempted to call patient to scheduled with Dr. Lalla Brothers or Dr. Jimmey Ralph.   Georgie Chard NP-C Structural Heart Team  Pager: [phone] Phone: [phone]

## 2023-02-12 DIAGNOSIS — R339 Retention of urine, unspecified: Secondary | ICD-10-CM | POA: Diagnosis not present

## 2023-02-12 DIAGNOSIS — M549 Dorsalgia, unspecified: Secondary | ICD-10-CM | POA: Diagnosis not present

## 2023-02-12 DIAGNOSIS — D6869 Other thrombophilia: Secondary | ICD-10-CM | POA: Diagnosis not present

## 2023-02-12 DIAGNOSIS — I4891 Unspecified atrial fibrillation: Secondary | ICD-10-CM | POA: Diagnosis not present

## 2023-02-23 NOTE — Telephone Encounter (Signed)
Attempt #2 to arrange Watchman consult.  Left message to call back.

## 2023-02-26 DIAGNOSIS — H401131 Primary open-angle glaucoma, bilateral, mild stage: Secondary | ICD-10-CM | POA: Diagnosis not present

## 2023-03-02 ENCOUNTER — Telehealth: Payer: Self-pay | Admitting: Physical Medicine and Rehabilitation

## 2023-03-02 NOTE — Telephone Encounter (Signed)
Patient called and wants to set up an appointment for the lower left back pain. CB#[phone]

## 2023-03-10 ENCOUNTER — Encounter: Payer: Self-pay | Admitting: Sports Medicine

## 2023-03-10 ENCOUNTER — Ambulatory Visit: Payer: Medicare Other | Admitting: Sports Medicine

## 2023-03-10 ENCOUNTER — Other Ambulatory Visit (INDEPENDENT_AMBULATORY_CARE_PROVIDER_SITE_OTHER): Payer: Medicare Other

## 2023-03-10 DIAGNOSIS — M629 Disorder of muscle, unspecified: Secondary | ICD-10-CM

## 2023-03-10 DIAGNOSIS — G8929 Other chronic pain: Secondary | ICD-10-CM | POA: Diagnosis not present

## 2023-03-10 DIAGNOSIS — M545 Low back pain, unspecified: Secondary | ICD-10-CM

## 2023-03-10 DIAGNOSIS — M47816 Spondylosis without myelopathy or radiculopathy, lumbar region: Secondary | ICD-10-CM | POA: Diagnosis not present

## 2023-03-10 MED ORDER — PREDNISONE 20 MG PO TABS: 20.0000 mg | ORAL_TABLET | Freq: Every day | ORAL | 0 refills | Status: AC

## 2023-03-10 NOTE — Progress Notes (Signed)
Michael Lambert - 67 y.o. male MRN [id number]  Date of birth: Jun 26, 1955  Office Visit Note: Visit Date: 03/10/2023 PCP: Michael Heys, MD (Inactive) Referred by: Michael Heys, MD  Subjective: Chief Complaint  Patient presents with   Lower Back - Pain   HPI: Michael Lambert is a pleasant 67 y.o. male who presents today for acute on chronic left-sided low back pain, worse in the last 3 weeks or so.  Michael Lambert is a very active and in-shape male. He mountain bikes and swims regularly.  He has had chronic low back pain in the past that every once a while will catch him when going from a flexed to extended position.  However over the last few weeks the back pain has worsened, worse on the left side.  He has done a few episodes of dry needling for the back but no dedicated formalized physical therapy.  He denies any numbness tingling or radiation down the leg.  The pain will go from the left low back to the posterior hip at times.  He is using topical Voltaren.  Has used Motrin just 2 tablets at nighttime as well which does help, but he has to limit this given his cardiac history.  Pertinent ROS were reviewed with the patient and found to be negative unless otherwise specified above in HPI.   Assessment & Plan: Visit Diagnoses:  1. Chronic left-sided low back pain without sciatica   2. Facet arthritis of lumbar region   3. Hamstring tightness of both lower extremities    Plan: Michael Lambert is dealing with an acute exacerbation of his chronic low back pain.  I did discuss with him I think his pain is twofold and being that he does have some facet arthropathy of the lumbar spine as well as reciprocal muscle tightness of the left side of the paraspinals.  This is likely being exacerbated by his tight hamstrings as well.  I do not see any instability about the spine.  We discussed all treatment options such as oral medication therapy, formalized physical therapy/HEP, advanced imaging.  We will avoid NSAIDs at this  time given his cardiac history, we will place him on a 7-day low-dose prednisone of 20 mg to be taken once daily.  He feels very comfortable and prefers home therapy versus formal PT at this time.  We did print out a customized handout for Michael Lambert flexion-based exercises for the low back, my athletic trainer Michael Lambert did review these in the room with him today.  He is to perform these once daily.  I would like to have him follow-up between 4-6 weeks to see his degree of improvement.  Follow-up: Return in about 5 weeks (around 04/14/2023) for f/u in about 5 weeks for f/u L-LBP .   Meds & Orders:  Meds ordered this encounter  Medications   predniSONE (DELTASONE) 20 MG tablet    Sig: Take 1 tablet (20 mg total) by mouth daily with breakfast for 7 days.    Dispense:  7 tablet    Refill:  0    Orders Placed This Encounter  Procedures   XR Lumbar Spine Complete     Procedures: No procedures performed      Clinical History: No specialty comments available.  He reports that he quit smoking about 42 years ago. His smoking use included cigarettes. He has never used smokeless tobacco. No results for input(s): "HGBA1C", "LABURIC" in the last 8760 hours.  Objective:    Physical Exam  Gen: Well-appearing,  in no acute distress; non-toxic CV: Well-perfused. Warm.  Resp: Breathing unlabored on room air; no wheezing. Psych: Fluid speech in conversation; appropriate affect; normal thought process Neuro: Sensation intact throughout. No gross coordination deficits.   Ortho Exam - Lumbar: No midline spinous process TTP.  There is left-sided paraspinal hypertonicity and some tenderness here.  No SI joint TTP.  There is full range of motion with flexion and extension, although some pain at endrange extension.  There is good strength of bilateral lower extremities.  There is associated FADIR and FABER testing of bilateral hips but this is not his typical back pain.  Imaging: XR Lumbar Spine  Complete  Result Date: 03/10/2023 Complete view of the lumbar spine including AP, lateral, flexion/extension views were ordered and reviewed by myself.  X-rays demonstrate no scoliosis.  There is no instability with flexion/extension views.  There is mild anterior bridging at the L1-L2 level with mild arthritic change of the lumbar spine but relatively well-preserved intervertebral disc spaces.  There is more notable facet hypertrophy of the lumbar spine from the L3-L5 location.   Past Medical/Family/Surgical/Social History: Medications & Allergies reviewed per EMR, new medications updated. Patient Active Problem List   Diagnosis Date Noted   Primary osteoarthritis of right knee 05/02/2021   Shoulder impingement syndrome, left 03/07/2021   Secondary hypercoagulable state (HCC) 07/31/2020   Coronary artery disease involving native coronary artery of native heart without angina pectoris 04/24/2019   Essential hypertension 04/24/2019   Exertional chest pain 12/01/2018   MRI of brain abnormal 05/14/2017   Pituitary cyst (HCC) 05/14/2017   Paroxysmal atrial fibrillation (HCC) 03/19/2017   Long term current use of anticoagulant 03/19/2017   PVC's (premature ventricular contractions) 03/19/2017   History of TIA (transient ischemic attack)    Migraine with aura 11/04/2016   Premature atrial contractions 05/29/2016   Left atrial dilatation 05/29/2016   Diarrhea 04/23/2016   Palpitations 01/28/2016   Anxiety 10/22/2011   URI (upper respiratory infection) 08/05/2010   HEMATOMA 02/26/2010   Hyperlipidemia 01/11/2007   GERD 01/11/2007   BENIGN PROSTATIC HYPERTROPHY 01/11/2007   HEADACHE 01/11/2007   Past Medical History:  Diagnosis Date   Anxiety    sees Dr. Phillip Lambert    Atrial fibrillation [facility])    on Xarelto daily   Benign prostatic hypertrophy    GERD (gastroesophageal reflux disease)    Hyperlipidemia    Migraine    Stroke (HCC)    TIA memorial day 2018   Family History   Problem Relation Age of Onset   Multiple sclerosis Other        family hx   Hyperlipidemia Father    Ovarian cancer Mother    Colon cancer Neg Hx    Esophageal cancer Neg Hx    Rectal cancer Neg Hx    Stomach cancer Neg Hx    Colon polyps Neg Hx    Past Surgical History:  Procedure Laterality Date   APPENDECTOMY     ATRIAL FIBRILLATION ABLATION N/A 07/03/2020   Procedure: ATRIAL FIBRILLATION ABLATION;  Surgeon: Hillis Range, MD;  Location: [facility] INVAS[facility] LAB;  Service: Cardiovascular;  Laterality: N/A;   colonoscopy  10/08/2012   per Dr. Arlyce Dice, adenomatous polyps, repeat in 5 yrs    COLONOSCOPY     CYSTOSCOPY     per Dr. Earlene Plater-    gum graft  08/2009   Dr Juluis Pitch    HERNIA REPAIR  2008   bilateral inguinal, per Dr. Abbey Chatters   implantable  loop recorder removal  04/17/2021   MDT Reveal LINQ removed   LOOP RECORDER INSERTION N/A 11/18/2016   Procedure: Loop Recorder Insertion;  Surgeon: Hillis Range, MD;  Location: [facility] INVASIVE CV LAB;  Service: Cardiovascular;  Laterality: N/A;   PALATE / UVULA BIOPSY / EXCISION  2004   per Dr. Ezzard Standing, for snoring    RADIAL KERATOTOMY     sees Dr. Ladoris Gene as FU  at [facility] , bilateral,  dr stonecipher did the RK    repair detached retina     TEE WITHOUT CARDIOVERSION N/A 11/18/2016   Procedure: TRANSESOPHAGEAL ECHOCARDIOGRAM (TEE);  Surgeon: Elease Hashimoto Deloris Ping, MD;  Location: [facility] ENDOSCOPY;  Service: Cardiovascular;  Laterality: N/A;   TONSILLECTOMY     Social History   Occupational History   Not on file  Tobacco Use   Smoking status: Former    Current packs/day: 0.00    Types: Cigarettes    Quit date: 05/19/1980    Years since quitting: 42.8   Smokeless tobacco: Never  Vaping Use   Vaping status: Never Used  Substance and Sexual Activity   Alcohol use: Yes    Alcohol/week: 2.0 standard drinks of alcohol    Types: 1 Glasses of wine, 1 Cans of beer per week    Comment: 2 glasses of wine each day   Drug use: No    Sexual activity: Not on file

## 2023-03-10 NOTE — Progress Notes (Signed)
Patient says that he has been having left-sided low back pain for years that has gotten worse in the last 3 weeks. He has been working with physical therapy for various injuries and says that he believes that dry needling in the back has exacerbated his pain; he had 3 sessions at most with the most recent being 3 weeks ago. Patient denies numbness/tingling. He says that Motrin has helped although he is not supposed to take it, and also uses topical NSAIDs. He says that ice feels good while using it, but heat seems to hurt it. Patient likes to swim and mountain bike and would like to know if he may continue with this activity.   Patient was instructed in 10 minutes of therapeutic exercises for low back to improve strength, ROM and function according to my instructions and plan of care by a Certified Athletic Trainer during the office visit. A customized handout was provided and demonstration of proper technique shown and discussed. Patient did perform exercises and demonstrate understanding through teachback.  All questions discussed and answered.

## 2023-03-16 ENCOUNTER — Telehealth: Payer: Self-pay

## 2023-03-16 NOTE — Telephone Encounter (Addendum)
Per [employer] report for 06/26/2020 cCT,   Michael Lambert: Chicken wing anatomy with elliptical ostium Max 25/ AVG 21/ Depth 15 Likely use a 27mm device and double curve sheath Inf/Mid TSP RAO 35 CAU 35

## 2023-03-19 DIAGNOSIS — M549 Dorsalgia, unspecified: Secondary | ICD-10-CM | POA: Diagnosis not present

## 2023-03-19 DIAGNOSIS — E7800 Pure hypercholesterolemia, unspecified: Secondary | ICD-10-CM | POA: Diagnosis not present

## 2023-03-19 DIAGNOSIS — I2510 Atherosclerotic heart disease of native coronary artery without angina pectoris: Secondary | ICD-10-CM | POA: Diagnosis not present

## 2023-03-19 DIAGNOSIS — Z0000 Encounter for general adult medical examination without abnormal findings: Secondary | ICD-10-CM | POA: Diagnosis not present

## 2023-03-19 DIAGNOSIS — E559 Vitamin D deficiency, unspecified: Secondary | ICD-10-CM | POA: Diagnosis not present

## 2023-03-19 DIAGNOSIS — E236 Other disorders of pituitary gland: Secondary | ICD-10-CM | POA: Diagnosis not present

## 2023-03-19 DIAGNOSIS — I10 Essential (primary) hypertension: Secondary | ICD-10-CM | POA: Diagnosis not present

## 2023-03-19 DIAGNOSIS — K219 Gastro-esophageal reflux disease without esophagitis: Secondary | ICD-10-CM | POA: Diagnosis not present

## 2023-03-19 DIAGNOSIS — Z8673 Personal history of transient ischemic attack (TIA), and cerebral infarction without residual deficits: Secondary | ICD-10-CM | POA: Diagnosis not present

## 2023-03-19 DIAGNOSIS — I4891 Unspecified atrial fibrillation: Secondary | ICD-10-CM | POA: Diagnosis not present

## 2023-03-19 DIAGNOSIS — D6869 Other thrombophilia: Secondary | ICD-10-CM | POA: Diagnosis not present

## 2023-03-31 ENCOUNTER — Encounter: Payer: Self-pay | Admitting: Gastroenterology

## 2023-04-02 ENCOUNTER — Encounter: Payer: Self-pay | Admitting: Gastroenterology

## 2023-04-14 ENCOUNTER — Ambulatory Visit: Payer: Medicare Other | Admitting: Sports Medicine

## 2023-04-23 ENCOUNTER — Encounter: Payer: Self-pay | Admitting: Sports Medicine

## 2023-04-23 ENCOUNTER — Ambulatory Visit: Payer: Medicare Other | Admitting: Sports Medicine

## 2023-04-23 DIAGNOSIS — M25511 Pain in right shoulder: Secondary | ICD-10-CM | POA: Diagnosis not present

## 2023-04-23 DIAGNOSIS — M545 Low back pain, unspecified: Secondary | ICD-10-CM

## 2023-04-23 DIAGNOSIS — G8929 Other chronic pain: Secondary | ICD-10-CM | POA: Diagnosis not present

## 2023-04-23 DIAGNOSIS — Z9889 Other specified postprocedural states: Secondary | ICD-10-CM

## 2023-04-23 DIAGNOSIS — M47816 Spondylosis without myelopathy or radiculopathy, lumbar region: Secondary | ICD-10-CM | POA: Diagnosis not present

## 2023-04-23 NOTE — Progress Notes (Signed)
Michael Lambert - 67 y.o. male MRN [id number]  Date of birth: 10/07/1955  Office Visit Note: Visit Date: 04/23/2023 PCP: Blair Heys, MD (Inactive) Referred by: No ref. provider found  Subjective: Chief Complaint  Patient presents with   Lower Back - Follow-up   HPI: Michael Lambert is a pleasant 67 y.o. male who presents today for follow-up of of acute on chronic left-sided low back pain.  Low back pain -his back pain is doing much better.  He states when he was taking the prednisone he felt excellent this helped the back and other joint aches and pains.  Once finishing the prednisone his back still feeling pretty well.  He continues his home exercises for the lumbar spine once daily and has been consistent with these, finding them beneficial.  He takes very infrequent Advil as needed for overall joint pains but has to limit this given his A-fib on Eliquis.  Right shoulder - he has a history of significant rotator cuff arthropathy with tearing.  He is a little over 1 year out from a rotator cuff repair with Dr. Rennis Chris.  He did perform physical therapy and still does exercises for the shoulders.  He has some pain within the shoulder with extreme range of motion of the shoulder, swimming and other activities reaching back. Denies any weakness.  Pertinent ROS were reviewed with the patient and found to be negative unless otherwise specified above in HPI.   Assessment & Plan: Visit Diagnoses:  1. Chronic left-sided low back pain without sciatica   2. Facet arthritis of lumbar region   3. Chronic right shoulder pain   4. History of rotator cuff surgery    Plan: Michael Lambert has made excellent improvement in his low back pain after the low-dose of prednisone as well as his consistent home exercise regimen for the low back.  He will discontinue the prednisone, we discussed infrequent use and avoiding prolonged use secondary to side effect profile, which she is understanding of.  I would like him to  continue his low back exercises daily for at least the next month and then transition to more maintenance at a 3x/week interval.  We also addressed his chronic right shoulder pain in which she has a history of rotator cuff repair surgery with Dr. Rennis Chris at [facility] over 1 year ago.  He has preserved strength but I think more of the intra-articular joint is bothering him. We discussed a trial of an ultrasound-guided glenohumeral joint injection, he is interested in considering this.  Will have him make an appointment for this and we we will obtain x-ray imaging before proceeding with possible injection.  We will also see how his back is doing at that appointment.  Follow-up: Return for R-shoulder injection and f/u back .   Meds & Orders: No orders of the defined types were placed in this encounter.  No orders of the defined types were placed in this encounter.    Procedures: No procedures performed      Clinical History: No specialty comments available.  He reports that he quit smoking about 42 years ago. His smoking use included cigarettes. He has never used smokeless tobacco. No results for input(s): "HGBA1C", "LABURIC" in the last 8760 hours.  Objective:    Physical Exam  Gen: Well-appearing, in no acute distress; non-toxic CV: Well-perfused. Warm.  Resp: Breathing unlabored on room air; no wheezing. Psych: Fluid speech in conversation; appropriate affect; normal thought process Neuro: Sensation intact throughout. No gross coordination deficits.  Ortho Exam - Right shoulder: No AC joint TTP.  There is full active and passive range of motion in all directions.  Mild pain with O'Brien's testing, negative empty can, negative drop arm.  There is 5/5 strength with rotator cuff testing, although very slight weakness with resisted ER compared to the contralateral shoulder.  Imaging:  Narrative & Impression  CLINICAL DATA:  Left shoulder pain for 3 months.  No recent injury    EXAM: MRI OF THE LEFT SHOULDER WITHOUT CONTRAST   TECHNIQUE: Multiplanar, multisequence MR imaging of the shoulder was performed. No intravenous contrast was administered.   COMPARISON:  X-ray 10/18/2020   FINDINGS: Rotator cuff: Mild supraspinatus and infraspinatus tendinosis with low-grade bursal sided fraying/irregularity of the distal aspect of both tendons. Subscapularis and teres minor tendons intact. No high-grade or full-thickness rotator cuff tear.   Muscles: Preserved bulk and signal intensity of the rotator cuff musculature without edema, atrophy, or fatty infiltration.   Biceps long head:  Mild intra-articular biceps tendinosis.   Acromioclavicular Joint: Moderate arthropathy of the AC joint. Small volume subacromial-subdeltoid bursal fluid.   Glenohumeral Joint: No joint effusion. No chondral defect.   Labrum: Grossly intact, but evaluation is limited by lack of intraarticular fluid. No paralabral cyst.   Bones: Reactive subchondral marrow signal changes at the [facility] joint. Otherwise, no marrow abnormality, fracture or dislocation.   Other: Mild inferior pericapsular edema. No abnormal thickening of the inferior glenohumeral ligament.   IMPRESSION: 1. Mild supraspinatus and infraspinatus tendinosis with low-grade bursal sided fraying/irregularity of the distal aspect of both tendons. No high-grade or full-thickness rotator cuff tear. 2. Mild intra-articular biceps tendinosis. 3. Moderate AC joint arthropathy with mild subacromial-subdeltoid bursitis. 4. Mild inferior pericapsular edema, which may reflect capsular strain or capsulitis.     Electronically Signed   By: Duanne Guess D.O.   On: 11/20/2020 11:14    Past Medical/Family/Surgical/Social History: Medications & Allergies reviewed per EMR, new medications updated. Patient Active Problem List   Diagnosis Date Noted   Primary osteoarthritis of right knee 05/02/2021   Shoulder impingement  syndrome, left 03/07/2021   Secondary hypercoagulable state (HCC) 07/31/2020   Coronary artery disease involving native coronary artery of native heart without angina pectoris 04/24/2019   Essential hypertension 04/24/2019   Exertional chest pain 12/01/2018   MRI of brain abnormal 05/14/2017   Pituitary cyst (HCC) 05/14/2017   Paroxysmal atrial fibrillation (HCC) 03/19/2017   Long term current use of anticoagulant 03/19/2017   PVC's (premature ventricular contractions) 03/19/2017   History of TIA (transient ischemic attack)    Migraine with aura 11/04/2016   Premature atrial contractions 05/29/2016   Left atrial dilatation 05/29/2016   Diarrhea 04/23/2016   Palpitations 01/28/2016   Anxiety 10/22/2011   URI (upper respiratory infection) 08/05/2010   HEMATOMA 02/26/2010   Hyperlipidemia 01/11/2007   GERD 01/11/2007   BENIGN PROSTATIC HYPERTROPHY 01/11/2007   HEADACHE 01/11/2007   Past Medical History:  Diagnosis Date   Anxiety    sees Dr. Phillip Heal    Atrial fibrillation [facility])    on Xarelto daily   Benign prostatic hypertrophy    GERD (gastroesophageal reflux disease)    Hyperlipidemia    Migraine    Stroke (HCC)    TIA memorial day 2018   Family History  Problem Relation Age of Onset   Multiple sclerosis Other        family hx   Hyperlipidemia Father    Ovarian cancer Mother    Colon cancer  Neg Hx    Esophageal cancer Neg Hx    Rectal cancer Neg Hx    Stomach cancer Neg Hx    Colon polyps Neg Hx    Past Surgical History:  Procedure Laterality Date   APPENDECTOMY     ATRIAL FIBRILLATION ABLATION N/A 07/03/2020   Procedure: ATRIAL FIBRILLATION ABLATION;  Surgeon: Hillis Range, MD;  Location: [facility] INVASIVE CV LAB;  Service: Cardiovascular;  Laterality: N/A;   colonoscopy  10/08/2012   per Dr. Arlyce Dice, adenomatous polyps, repeat in 5 yrs    COLONOSCOPY     CYSTOSCOPY     per Dr. Earlene Plater-    gum graft  08/2009   Dr Juluis Pitch    HERNIA REPAIR  2008    bilateral inguinal, per Dr. Abbey Chatters   implantable loop recorder removal  04/17/2021   MDT Reveal LINQ removed   LOOP RECORDER INSERTION N/A 11/18/2016   Procedure: Loop Recorder Insertion;  Surgeon: Hillis Range, MD;  Location: [facility] INVASIVE CV LAB;  Service: Cardiovascular;  Laterality: N/A;   PALATE / UVULA BIOPSY / EXCISION  2004   per Dr. Ezzard Standing, for snoring    RADIAL KERATOTOMY     sees Dr. Ladoris Gene as FU  at [facility] , bilateral,  dr stonecipher did the RK    repair detached retina     TEE WITHOUT CARDIOVERSION N/A 11/18/2016   Procedure: TRANSESOPHAGEAL ECHOCARDIOGRAM (TEE);  Surgeon: Elease Hashimoto Deloris Ping, MD;  Location: [facility] ENDOSCOPY;  Service: Cardiovascular;  Laterality: N/A;   TONSILLECTOMY     Social History   Occupational History   Not on file  Tobacco Use   Smoking status: Former    Current packs/day: 0.00    Types: Cigarettes    Quit date: 05/19/1980    Years since quitting: 42.9   Smokeless tobacco: Never  Vaping Use   Vaping status: Never Used  Substance and Sexual Activity   Alcohol use: Yes    Alcohol/week: 2.0 standard drinks of alcohol    Types: 1 Glasses of wine, 1 Cans of beer per week    Comment: 2 glasses of wine each day   Drug use: No   Sexual activity: Not on file   I spent 32 minutes in the care of the patient today including face-to-face time, preparation to see the patient, as well as review of previous imaging pertaining to the back and shoulder, record and personal review of previous shoulder/rotator cuff surgery, discussion on conservative and alternative treatments, continuation of home exercise program therapy guidance for the above diagnoses.   Madelyn Brunner, DO Primary Care Sports Medicine Physician  [facility] - Orthopedics  This note was dictated using Dragon naturally speaking software and may contain errors in syntax, spelling, or content which have not been identified prior to signing this note.

## 2023-04-23 NOTE — Progress Notes (Signed)
Patient says that he is feeling much better. He said that he felt great while taking the Prednisone, including other joint pain he had been experiencing, and that his pain hasn't spiked back up since being off of it. He is inquiring about any longer term solutions like the Prednisone. He says that he has been on a routine with his exercises and feels that they are helping a lot as well; he appreciates the smaller loads of exercises that he is able to incorporate routinely. He says that his pain is still in the same location, but movements such as getting into and out of the car are no less painful and he does not have to be so careful while moving.

## 2023-04-28 ENCOUNTER — Ambulatory Visit: Payer: Medicare Other | Admitting: Gastroenterology

## 2023-04-30 ENCOUNTER — Ambulatory Visit: Payer: Medicare Other | Admitting: Gastroenterology

## 2023-05-07 ENCOUNTER — Other Ambulatory Visit: Payer: Self-pay

## 2023-05-07 ENCOUNTER — Ambulatory Visit: Payer: Medicare Other | Admitting: Sports Medicine

## 2023-05-07 ENCOUNTER — Encounter: Payer: Self-pay | Admitting: Sports Medicine

## 2023-05-07 ENCOUNTER — Other Ambulatory Visit (INDEPENDENT_AMBULATORY_CARE_PROVIDER_SITE_OTHER): Payer: Medicare Other

## 2023-05-07 DIAGNOSIS — M25511 Pain in right shoulder: Secondary | ICD-10-CM

## 2023-05-07 DIAGNOSIS — G8929 Other chronic pain: Secondary | ICD-10-CM

## 2023-05-07 DIAGNOSIS — M19011 Primary osteoarthritis, right shoulder: Secondary | ICD-10-CM | POA: Diagnosis not present

## 2023-05-07 MED ORDER — LIDOCAINE HCL 1 % IJ SOLN
2.0000 mL | INTRAMUSCULAR | Status: AC | PRN
  Administered 2023-05-07: 2 mL

## 2023-05-07 MED ORDER — BUPIVACAINE HCL 0.25 % IJ SOLN
2.0000 mL | INTRAMUSCULAR | Status: AC | PRN
  Administered 2023-05-07: 2 mL via INTRA_ARTICULAR

## 2023-05-07 MED ORDER — METHYLPREDNISOLONE ACETATE 40 MG/ML IJ SUSP
80.0000 mg | INTRAMUSCULAR | Status: AC | PRN
  Administered 2023-05-07: 80 mg via INTRA_ARTICULAR

## 2023-05-07 NOTE — Progress Notes (Signed)
Michael Lambert - 67 y.o. male MRN [id number]  Date of birth: 1956/05/02  Office Visit Note: Visit Date: 05/07/2023 PCP: Blair Heys, MD (Inactive) Referred by: No ref. provider found  Subjective: Chief Complaint  Patient presents with   Right Shoulder - Pain   HPI: Michael Lambert is a pleasant 67 y.o. male who presents today for chronic right shoulder pain.  Right shoulder - he has a history of significant rotator cuff arthropathy with tearing.  He is a little over 1 year out from a rotator cuff repair with Dr. Rennis Chris.  He did perform physical therapy and still does exercises for the shoulders.  He continues being active with swimming.  Still has some pain in the shoulder, more so with extreme ranges of motion.  He is interested in proceeding with injection therapy today.  Does take ibuprofen very infrequently but limits this given his Eliquis use.  Also mentions the left shoulder bothers him at times as well as both of his hips.  He is amenable to having an evaluation at this at a later time. Has had MRI's before.  Pertinent ROS were reviewed with the patient and found to be negative unless otherwise specified above in HPI.   Assessment & Plan: Visit Diagnoses:  1. Chronic right shoulder pain   2. Primary osteoarthritis, right shoulder    Plan: Impression is acute on chronic right shoulder pain in the history of prior rotator cuff repair.  He does have some age-appropriate osteoarthritic change of the glenohumeral joint and AC joint.  Through shared decision-making, we did proceed with ultrasound-guided intra-articular injection.  Advised on modified rest and activity over the next 48 hours and then may return to swimming and other physical activity as his pain allows.  Okay for ice/heat, Tylenol for any postinjection pain.  I would like to see him back in about 3 weeks to see what sort of relief he obtains from this.  Could consider getting him into a home exercise program as well.  He  also mentioned addressing the left shoulder and possibly both of his hips, he will obtain his previous MRIs and try to bring them to that appointment and we will work these up as he needs. F/u in 3 weeks.  Follow-up: Return in about 3 weeks (around 05/28/2023) for R-shoulder f/u, consider L-shoulder eval/inj (30-min).   Meds & Orders: No orders of the defined types were placed in this encounter.   Orders Placed This Encounter  Procedures   Large Joint Inj   US Guided Needle Placement - No Linked Charges   XR Shoulder Right     Procedures: Large Joint Inj: R glenohumeral on 05/07/2023 10:03 AM Indications: pain Details: 22 G 3.5 in needle, ultrasound-guided posterior approach Medications: 2 mL lidocaine 1 %; 2 mL bupivacaine 0.25 %; 80 mg methylPREDNISolone acetate 40 MG/ML Outcome: tolerated well, no immediate complications  US-guided glenohumeral joint injection, right shoulder After discussion on risks/benefits/indications, informed verbal consent was obtained. A timeout was then performed. The patient was positioned lying lateral recumbent on examination table. The patient's shoulder was prepped with betadine and multiple alcohol swabs and utilizing ultrasound guidance, the patient's glenohumeral joint was identified on ultrasound. Using ultrasound guidance a 22-gauge, 3.5 inch needle with a mixture of 2:2:2 cc's lidocaine:bupivicaine:depomedrol was directed from a lateral to medial direction via in-plane technique into the glenohumeral joint with visualization of appropriate spread of injectate into the joint. Patient tolerated the procedure well without immediate complications.  Procedure, treatment alternatives, risks and benefits explained, specific risks discussed. Consent was given by the patient. Immediately prior to procedure a time out was called to verify the correct patient, procedure, equipment, support staff and site/side marked as required. Patient was prepped and draped  in the usual sterile fashion.          Clinical History: No specialty comments available.  He reports that he quit smoking about 42 years ago. His smoking use included cigarettes. He has never used smokeless tobacco. No results for input(s): "HGBA1C", "LABURIC" in the last 8760 hours.  Objective:    Physical Exam  Gen: Well-appearing, in no acute distress; non-toxic CV:  Well-perfused. Warm.  Resp: Breathing unlabored on room air; no wheezing. Psych: Fluid speech in conversation; appropriate affect; normal thought process Neuro: Sensation intact throughout. No gross coordination deficits.   Ortho Exam - Right: Equivocal AC joint TTP, no other bony tenderness.  There is full active and passive range of motion, there is mild pain with O'Brien's and full range external rotation.  No significant weakness with rotator cuff testing.  Imaging: XR Shoulder Right Result Date: 05/07/2023 Three-view x-ray of the right shoulder including Grashey, scapular Y and axial view were ordered and reviewed by myself.  X-rays demonstrate mild to moderate glenohumeral joint and AC joint arthritis.  There is slight superior migration of the humeral head.  There is some sclerotic change within the humeral head.  No acute fracture or otherwise bony abnormality noted.   Past Medical/Family/Surgical/Social History: Medications & Allergies reviewed per EMR, new medications updated. Patient Active Problem List   Diagnosis Date Noted   Primary osteoarthritis of right knee 05/02/2021   Shoulder impingement syndrome, left 03/07/2021   Secondary hypercoagulable state (HCC) 07/31/2020   Coronary artery disease involving native coronary artery of native heart without angina pectoris 04/24/2019   Essential hypertension 04/24/2019   Exertional chest pain 12/01/2018   MRI of brain abnormal 05/14/2017   Pituitary cyst (HCC) 05/14/2017   Paroxysmal atrial fibrillation (HCC) 03/19/2017   Long term current use of  anticoagulant 03/19/2017   PVC's (premature ventricular contractions) 03/19/2017   History of TIA (transient ischemic attack)    Migraine with aura 11/04/2016   Premature atrial contractions 05/29/2016   Left atrial dilatation 05/29/2016   Diarrhea 04/23/2016   Palpitations 01/28/2016   Anxiety 10/22/2011   URI (upper respiratory infection) 08/05/2010   HEMATOMA 02/26/2010   Hyperlipidemia 01/11/2007   GERD 01/11/2007   BENIGN PROSTATIC HYPERTROPHY 01/11/2007   HEADACHE 01/11/2007   Past Medical History:  Diagnosis Date   Anxiety    sees Dr. Phillip Heal    Atrial fibrillation [facility])    on Xarelto daily   Benign prostatic hypertrophy    GERD (gastroesophageal reflux disease)    Hyperlipidemia    Migraine    Stroke (HCC)    TIA memorial day 2018   Family History  Problem Relation Age of Onset   Multiple sclerosis Other        family hx   Hyperlipidemia Father    Ovarian cancer Mother    Colon cancer Neg Hx    Esophageal cancer Neg Hx    Rectal cancer Neg Hx    Stomach cancer Neg Hx    Colon polyps Neg Hx    Past Surgical History:  Procedure Laterality Date   APPENDECTOMY     ATRIAL FIBRILLATION ABLATION N/A 07/03/2020   Procedure: ATRIAL FIBRILLATION ABLATION;  Surgeon: Hillis Range, MD;  Location: [facility] INVASIVE  CV LAB;  Service: Cardiovascular;  Laterality: N/A;   colonoscopy  10/08/2012   per Dr. Arlyce Dice, adenomatous polyps, repeat in 5 yrs    COLONOSCOPY     CYSTOSCOPY     per Dr. Earlene Plater-    gum graft  08/2009   Dr Juluis Pitch    HERNIA REPAIR  2008   bilateral inguinal, per Dr. Abbey Chatters   implantable loop recorder removal  04/17/2021   MDT Reveal LINQ removed   LOOP RECORDER INSERTION N/A 11/18/2016   Procedure: Loop Recorder Insertion;  Surgeon: Hillis Range, MD;  Location: [facility] INVASIVE CV LAB;  Service: Cardiovascular;  Laterality: N/A;   PALATE / UVULA BIOPSY / EXCISION  2004   per Dr. Ezzard Standing, for snoring    RADIAL KERATOTOMY     sees Dr. Ladoris Gene  as FU  at [facility] , bilateral,  dr stonecipher did the RK    repair detached retina     TEE WITHOUT CARDIOVERSION N/A 11/18/2016   Procedure: TRANSESOPHAGEAL ECHOCARDIOGRAM (TEE);  Surgeon: Elease Hashimoto Deloris Ping, MD;  Location: [facility] ENDOSCOPY;  Service: Cardiovascular;  Laterality: N/A;   TONSILLECTOMY     Social History   Occupational History   Not on file  Tobacco Use   Smoking status: Former    Current packs/day: 0.00    Types: Cigarettes    Quit date: 05/19/1980    Years since quitting: 42.9   Smokeless tobacco: Never  Vaping Use   Vaping status: Never Used  Substance and Sexual Activity   Alcohol use: Yes    Alcohol/week: 2.0 standard drinks of alcohol    Types: 1 Glasses of wine, 1 Cans of beer per week    Comment: 2 glasses of wine each day   Drug use: No   Sexual activity: Not on file

## 2023-05-18 NOTE — Progress Notes (Signed)
 Electrophysiology Office Note:    Date:  05/19/2023   ID:  Michael Lambert, DOB Nov 30, 1955, MRN [id number]  [facility] Cardiologist:  Michael Balding, MD  Michael Lambert:  Michael ONEIDA HOLTS, MD   Referring MD: Michael Charleston, MD   Chief Complaint: Atrial fibrillation  History of Present Illness:    Michael Lambert is a 68 year old man who I am seeing today for an evaluation of atrial fibrillation at the request of Dr. Balding.  The patient was last seen by Dr. Balding February 02, 2023.  The patient has a history of paroxysmal atrial fibrillation, TIA, moderate coronary artery disease by coronary CT.  He had an A-fib ablation in February 2022.  The patient has arthritis and previously was on NSAIDs but had to stop them because of chronic anticoagulation.  He is interested in avoiding long-term exposure anticoagulation given his active lifestyle, desire to use NSAIDs.  He is doing well.  Confirms the above history.  He takes NSAIDs several times per week for arthritic pain.  He is concerned about the elevated bleeding risk while on this medication.     Their past medical, social and family history was reviewed.   ROS:   Please see the history of present illness.    All other systems reviewed and are negative.  EKGs/Labs/Other Studies Reviewed:    The following studies were reviewed today:  Fibber 21st 2022 echo EF 60-65 RV function normal Mild MR  June 28, 2020 CT cardiac personally reviewed Windsock/chicken wing morphology       Physical Exam:    VS:  BP 134/70   Pulse (!) 55   Ht 5' 9 (1.753 m)   Wt 170 lb (77.1 kg)   SpO2 97%   BMI 25.10 kg/m     Wt Readings from Last 3 Encounters:  05/19/23 170 lb (77.1 kg)  02/02/23 167 lb 12.8 oz (76.1 kg)  10/03/21 175 lb 6.4 oz (79.6 kg)     GEN: no distress.  Appears younger than stated age CARD: RRR, No MRG RESP: No IWOB. CTAB.        ASSESSMENT AND PLAN:    1. Paroxysmal atrial  fibrillation (HCC)   2. Coronary artery disease involving native coronary artery of native heart without angina pectoris   3. Essential hypertension   4. History of TIA (transient ischemic attack)    #pAF Low burden following ablation. On eliquis  but prefers a long-term stroke mitigation strategy that avoids exposure to anticoagulation given his active lifestyle.  We have discussed the left atrial appendage occlusion procedure in detail including the risks and likelihood of success.  He is interested in proceeding.  ---------------  I have seen Michael Lambert in the office today who is being considered for a Watchman left atrial appendage closure device. I believe they will benefit from this procedure given their history of atrial fibrillation, CHA2DS2-VASc score of 5 and unadjusted ischemic stroke rate of 7.2% per year. Unfortunately, the patient is not felt to be a long term anticoagulation candidate secondary to use of NSAIDS and active lifestyle with increased risk of bleeding. The patient's chart has been reviewed and I feel that they would be a candidate for short term oral anticoagulation after Watchman implant.   It is my belief that after undergoing a LAA closure procedure, Michael Lambert will not need long term anticoagulation which eliminates anticoagulation side effects and major bleeding risk.   Procedural risks for the Watchman implant have been reviewed with the patient  including a 0.5% risk of stroke, <1% risk of perforation and <1% risk of device embolization. Other risks include bleeding, vascular damage, tamponade, worsening renal function, and death. The patient understands these risk and wishes to proceed.     The published clinical data on the safety and effectiveness of WATCHMAN include but are not limited to the following: - Holmes DR, Jess BEARD, Sick P et al. for the PROTECT AF Investigators. Percutaneous closure of the left atrial appendage versus warfarin therapy for prevention  of stroke in patients with atrial fibrillation: a randomised non-inferiority trial. Lancet 2009; 374: 534-42. GLENWOOD Jess BEARD, Doshi SK, Jonita VEAR Satchel D et al. on behalf of the PROTECT AF Investigators. Percutaneous Left Atrial Appendage Closure for Stroke Prophylaxis in Patients With Atrial Fibrillation 2.3-Year Follow-up of the PROTECT AF (Watchman Left Atrial Appendage System for Embolic Protection in Patients With Atrial Fibrillation) Trial. Circulation 2013; 127:720-729. - Alli O, Doshi S,  Kar S, Reddy VY, Sievert H et al. Quality of Life Assessment in the Randomized PROTECT AF (Percutaneous Closure of the Left Atrial Appendage Versus Warfarin Therapy for Prevention of Stroke in Patients With Atrial Fibrillation) Trial of Patients at Risk for Stroke With Nonvalvular Atrial Fibrillation. J Am Coll Cardiol 2013; 61:1790-8. GLENWOOD Satchel DR, Archer RAMAN, Price M, Whisenant B, Sievert H, Doshi S, Huber K, Reddy V. Prospective randomized evaluation of the Watchman left atrial appendage Device in patients with atrial fibrillation versus long-term warfarin therapy; the PREVAIL trial. Journal of the [employer] of Cardiology, Vol. 4, No. 1, 2014, 1-11. - Kar S, Doshi SK, Sadhu A, Horton R, Osorio J et al. Primary outcome evaluation of a next-generation left atrial appendage closure device: results from the PINNACLE FLX trial. Circulation 2021;143(18)1754-1762.    After today's visit with the patient which was dedicated solely for shared decision making visit regarding LAA closure device, the patient decided to proceed with the LAA appendage closure procedure scheduled to be done in the near future at [facility]. Prior to the procedure, I would like to obtain a gated CT scan of the chest with contrast timed for PV/LA visualization.   Additionally, the patient will need an updated echo.  HAS-BLED score 4 Hypertension Yes  Abnormal renal and liver function (Dialysis, transplant, Cr >2.26 mg/dL /Cirrhosis  or Bilirubin >2x Normal or AST/ALT/AP >3x Normal) No  Stroke Yes  Bleeding No  Labile INR (Unstable/high INR) No  Elderly (>65) Yes  Drugs or alcohol (>= 8 drinks/week, anti-plt or NSAID) Yes   CHA2DS2-VASc Score = 5  The patient's score is based upon: CHF History: 0 HTN History: 1 Diabetes History: 0 Stroke History: 2 Vascular Disease History: 1 Age Score: 1 Gender Score: 0  Will let us  know if he wishes to proceed.  I will have Katie reach out to him.      Signed, Michael DASEN. Cindie, MD, [facility], [facility] 05/19/2023 10:36 AM    Electrophysiology [facility]

## 2023-05-19 ENCOUNTER — Encounter: Payer: Self-pay | Admitting: Cardiology

## 2023-05-19 ENCOUNTER — Ambulatory Visit: Payer: Medicare Other | Attending: Cardiology | Admitting: Cardiology

## 2023-05-19 VITALS — BP 134/70 | HR 55 | Ht 69.0 in | Wt 170.0 lb

## 2023-05-19 DIAGNOSIS — Z8673 Personal history of transient ischemic attack (TIA), and cerebral infarction without residual deficits: Secondary | ICD-10-CM | POA: Diagnosis not present

## 2023-05-19 DIAGNOSIS — I480 Paroxysmal atrial fibrillation: Secondary | ICD-10-CM | POA: Diagnosis not present

## 2023-05-19 DIAGNOSIS — I2510 Atherosclerotic heart disease of native coronary artery without angina pectoris: Secondary | ICD-10-CM

## 2023-05-19 DIAGNOSIS — I10 Essential (primary) hypertension: Secondary | ICD-10-CM

## 2023-05-19 NOTE — Patient Instructions (Signed)
 Medication Instructions:  Your physician recommends that you continue on your current medications as directed. Please refer to the Current Medication list given to you today.  *If you need a refill on your cardiac medications before your next appointment, please call your pharmacy*  Testing/Procedures: Your physician has requested that you have Left atrial appendage (LAA) closure device implantation is a procedure to put a small device in the LAA of the heart. The LAA is a small sac in the wall of the heart's left upper chamber. Blood clots can form in this area. The device, Watchman closes the LAA to help prevent a blood clot and stroke.   Follow-Up: At [facility], you and your health needs are our priority.  As part of our continuing mission to provide you with exceptional heart care, we have created designated Provider Care Teams.  These Care Teams include your primary Cardiologist (physician) and Advanced Practice Providers (APPs -  Physician Assistants and Nurse Practitioners) who all work together to provide you with the care you need, when you need it.  Your next appointment:   You will be contacted by Nurse Navigator, Karsten Fells to schedule your pre-procedure visit and procedure date. If you have any questions she can be reached at [phone].

## 2023-05-22 DIAGNOSIS — R3912 Poor urinary stream: Secondary | ICD-10-CM | POA: Diagnosis not present

## 2023-05-22 DIAGNOSIS — N401 Enlarged prostate with lower urinary tract symptoms: Secondary | ICD-10-CM | POA: Diagnosis not present

## 2023-05-22 DIAGNOSIS — R351 Nocturia: Secondary | ICD-10-CM | POA: Diagnosis not present

## 2023-05-28 ENCOUNTER — Other Ambulatory Visit: Payer: Self-pay

## 2023-05-28 ENCOUNTER — Ambulatory Visit: Payer: Medicare Other | Admitting: Sports Medicine

## 2023-05-28 ENCOUNTER — Encounter: Payer: Self-pay | Admitting: Sports Medicine

## 2023-05-28 ENCOUNTER — Other Ambulatory Visit (INDEPENDENT_AMBULATORY_CARE_PROVIDER_SITE_OTHER): Payer: Self-pay

## 2023-05-28 DIAGNOSIS — M19011 Primary osteoarthritis, right shoulder: Secondary | ICD-10-CM

## 2023-05-28 DIAGNOSIS — M25551 Pain in right hip: Secondary | ICD-10-CM | POA: Diagnosis not present

## 2023-05-28 DIAGNOSIS — G8929 Other chronic pain: Secondary | ICD-10-CM

## 2023-05-28 DIAGNOSIS — M1611 Unilateral primary osteoarthritis, right hip: Secondary | ICD-10-CM | POA: Diagnosis not present

## 2023-05-28 DIAGNOSIS — M545 Low back pain, unspecified: Secondary | ICD-10-CM

## 2023-05-28 MED ORDER — METHYLPREDNISOLONE ACETATE 40 MG/ML IJ SUSP
80.0000 mg | INTRAMUSCULAR | Status: AC | PRN
  Administered 2023-05-28: 80 mg via INTRA_ARTICULAR

## 2023-05-28 MED ORDER — LIDOCAINE HCL 1 % IJ SOLN
4.0000 mL | INTRAMUSCULAR | Status: AC | PRN
  Administered 2023-05-28: 4 mL

## 2023-05-28 NOTE — Progress Notes (Signed)
 Patient says that his right shoulder feels much better after the injection, and is left shoulder is not bothering him at all. He says that his back pain is starting to return but is not as bad as it was initially. He says that his hips are also in pain, with right worse than left. He does still do his home exercises.  Patient was instructed in 10 minutes of therapeutic exercises for bilateral hips to improve strength, ROM and function according to my instructions and plan of care by a Certified Athletic Trainer during the office visit. A customized handout was provided and demonstration of proper technique shown and discussed. Patient did perform exercises and demonstrate understanding through teachback.  All questions discussed and answered.

## 2023-05-28 NOTE — Progress Notes (Signed)
 Michael Lambert - 68 y.o. male MRN [id number]  Date of birth: 1955/10/28  Office Visit Note: Visit Date: 05/28/2023 PCP: Michael Charleston, MD (Inactive) Referred by: No ref. provider found  Subjective: Chief Complaint  Patient presents with   Right Shoulder - Follow-up   HPI: Michael Lambert is a pleasant 68 y.o. male who presents today for follow-up right shoulder; also with chronic LBP and moreso R-hip pain now.  R-shoulder - he has a history of significant rotator cuff arthropathy with tearing. He is a little over 1 year out from a rotator cuff repair with Dr. Melita. We did perform US -GHJ injection 3 weeks ago and he is doing significantly better. Has much more fluid ROM and pain is much less.  L-shoulder -patient is actually doing much better, likely less compensatory pain.  Back -still has some back pain, it is not significantly worse but has crept back up on him here recently.  In months past we did do a low-dose of prednisone  which she responded excellent to.  He is very judicious with doing his back and overall home exercises.  His pain is worse when going from sitting to standing or after flexing at the hips and extending the back.  R-hip -he is having pain but feels deep within the hip joint more anteriorly, sometimes radiates laterally.  No numbness or tingling.  He had an MRI of the hip years ago which showed a degenerative labral tear and they told him he had arthritis which we could be looking at hip replacement.  He takes very infrequent Advil  as needed he is on Eliquis  for his A-fib but is in the process of considering Watchman device.  *Independent note reviewed from Dr. Cindie, cardiology on 05/19/2023 with paroxysmal A-fib, discussion on anticoagulation risk/benefits with possible Watchman procedure.  Pertinent ROS were reviewed with the patient and found to be negative unless otherwise specified above in HPI.   Assessment & Plan: Visit Diagnoses:  1. Pain in right hip   2.  Unilateral primary osteoarthritis, right hip   3. Primary osteoarthritis, right shoulder   4. Chronic left-sided low back pain without sciatica    Plan: [name] had excellent relief of his pain and function after intra-articular right shoulder injection under ultrasound guidance.  He will continue his home exercises for rotator cuff and shoulder stability for both shoulders.  He is having exacerbation of his right hip pain which does have moderate hip arthritis and a history of degenerative labral tearing confirmed by MRI, which I reviewed today.  He does have some weakness with hip abduction.  He shared decision making, we did proceed with ultrasound-guided right intra-articular hip injection.  He will for 48 hours of modified rest/activity.  We discussed therapy versus home exercises, he would like to perform these at home.  We did print out a customized hip stabilization and strengthening protocol for him and my athletic trainer Jinnie did review these in the room with him today.  He may use ice/heat and/or Tylenol  with very infrequent NSAID use for his back or hip pains.  He will notify me of his improvement over the next week or so and we will follow-up depending on how he is doing.  Did discuss if he does move forward with a Watchman device, he may benefit from a low-dose NSAID such as Aleve/ibuprofen  or even a as needed meloxicam but will need to hold for this for now given his Eliquis  use.  Follow-up: Return if symptoms worsen or fail  to improve.   Meds & Orders: No orders of the defined types were placed in this encounter.   Orders Placed This Encounter  Procedures   Large Joint Inj   XR HIP UNILAT W OR W/O PELVIS 2-3 VIEWS RIGHT   US  Guided Needle Placement - No Linked Charges     Procedures: Large Joint Inj: R hip joint on 05/28/2023 10:14 AM Indications: pain Details: 22 G 3.5 in needle, ultrasound-guided anterior approach Medications: 4 mL lidocaine  1 %; 80 mg methylPREDNISolone  acetate  40 MG/ML Outcome: tolerated well, no immediate complications  Procedure: US -guided intra-articular hip injection, Right After discussion on risks/benefits/indications and informed verbal consent was obtained, a timeout was performed. Patient was lying supine on exam table. The hip was cleaned with betadine and alcohol swabs. Then utilizing ultrasound guidance, the patient's femoral head and neck junction was identified and subsequently injected with 4:2 lidocaine :depomedrol via an in-plane approach with ultrasound visualization of the injectate administered into the hip joint. Patient tolerated procedure well without immediate complications.  Procedure, treatment alternatives, risks and benefits explained, specific risks discussed. Consent was given by the patient. Immediately prior to procedure a time out was called to verify the correct patient, procedure, equipment, support staff and site/side marked as required. Patient was prepped and draped in the usual sterile fashion.          Clinical History: No specialty comments available.  He reports that he quit smoking about 43 years ago. His smoking use included cigarettes. He has never used smokeless tobacco. No results for input(s): HGBA1C, LABURIC in the last 8760 hours.  Objective:    Physical Exam  Gen: Well-appearing, in no acute distress; non-toxic CV: Well-perfused. Warm.  Resp: Breathing unlabored on room air; no wheezing. Psych: Fluid speech in conversation; appropriate affect; normal thought process  Ortho Exam - Right hip: There is about 5 degrees less of internal logroll compared to the contralateral hip but good range of motion with external rotation.  Positive FADIR, negative FABER test.  There is 4/5 strength with some weakness of resisted hip abduction on the right compared to 5/5 strength on the contralateral hip.  - Right shoulder:   Imaging: XR HIP UNILAT W OR W/O PELVIS 2-3 VIEWS RIGHT Result Date: 05/28/2023 3  views of the right hip including AP and lateral film were ordered and reviewed by myself.  X-rays show a femoral head well-seated within the acetabulum.  There is mild to moderate arthritic change about the hip with areas over the superomedial aspect of the femoral head with joint space narrowing, although certainly no complete loss of joint space.   *Independent review and interpretation of right hip MRI from 10/19/2019 was performed by myself today.  X-rays show degenerative labral tear more so in the anterior to posterior direction of superior labrum.  There are some patchy areas of mild cartilage loss indicative of early osteoarthritis.  Narrative & Impression  CLINICAL DATA:  Right hip pain for 1 year.  No known injury   EXAM: MR OF THE RIGHT HIP WITHOUT CONTRAST   TECHNIQUE: Multiplanar, multisequence MR imaging was performed. No intravenous contrast was administered.   COMPARISON:  X-ray 12/21/2015   FINDINGS: Bones: No acute fracture. No dislocation. No femoral head avascular necrosis. No bone marrow edema. No suspicious bone lesion.   Articular cartilage and labrum   Articular cartilage:  No chondral defect.   Labrum: Partial-thickness tear of the anterosuperior labrum (series 6, image 18). No paralabral cyst.   Joint  or bursal effusion   Joint effusion: Trace joint effusion without evidence of synovitis or intra-articular loose body.   Bursae: No abnormal bursal fluid collection.   Muscles and tendons   Muscles and tendons: The gluteal, hamstring, iliopsoas, rectus femoris, and adductor tendons appear intact without tear or significant tendinosis. No muscle signal abnormality.   Other findings   Miscellaneous:   None.   IMPRESSION: 1. Partial-thickness tear of the anterosuperior labrum. 2. No acute osseous abnormality or significant arthropathy of the right hip.     Electronically Signed   By: Mabel Converse D.O.   On: 10/19/2019 14:55   Past  Medical/Family/Surgical/Social History: Medications & Allergies reviewed per EMR, new medications updated. Patient Active Problem List   Diagnosis Date Noted   Primary osteoarthritis of right knee 05/02/2021   Shoulder impingement syndrome, left 03/07/2021   Secondary hypercoagulable state (HCC) 07/31/2020   Coronary artery disease involving native coronary artery of native heart without angina pectoris 04/24/2019   Essential hypertension 04/24/2019   Exertional chest pain 12/01/2018   MRI of brain abnormal 05/14/2017   Pituitary cyst (HCC) 05/14/2017   Paroxysmal atrial fibrillation (HCC) 03/19/2017   Long term current use of anticoagulant 03/19/2017   PVC's (premature ventricular contractions) 03/19/2017   History of TIA (transient ischemic attack)    Migraine with aura 11/04/2016   Premature atrial contractions 05/29/2016   Left atrial dilatation 05/29/2016   Diarrhea 04/23/2016   Palpitations 01/28/2016   Anxiety 10/22/2011   URI (upper respiratory infection) 08/05/2010   HEMATOMA 02/26/2010   Hyperlipidemia 01/11/2007   GERD 01/11/2007   BENIGN PROSTATIC HYPERTROPHY 01/11/2007   HEADACHE 01/11/2007   Past Medical History:  Diagnosis Date   Anxiety    sees Dr. Slater Mam    Atrial fibrillation [facility])    on Xarelto  daily   Benign prostatic hypertrophy    GERD (gastroesophageal reflux disease)    Hyperlipidemia    Migraine    Stroke (HCC)    TIA memorial day 2018   Family History  Problem Relation Age of Onset   Multiple sclerosis Other        family hx   Hyperlipidemia Father    Ovarian cancer Mother    Colon cancer Neg Hx    Esophageal cancer Neg Hx    Rectal cancer Neg Hx    Stomach cancer Neg Hx    Colon polyps Neg Hx    Past Surgical History:  Procedure Laterality Date   APPENDECTOMY     ATRIAL FIBRILLATION ABLATION N/A 07/03/2020   Procedure: ATRIAL FIBRILLATION ABLATION;  Surgeon: Kelsie Agent, MD;  Location: [facility] INVASIVE CV LAB;  Service:  Cardiovascular;  Laterality: N/A;   colonoscopy  10/08/2012   per Dr. Debrah, adenomatous polyps, repeat in 5 yrs    COLONOSCOPY     CYSTOSCOPY     per Dr. Nicholaus-    gum graft  08/2009   Dr Agent Bertin    HERNIA REPAIR  2008   bilateral inguinal, per Dr. Lily   implantable loop recorder removal  04/17/2021   MDT Reveal LINQ removed   LOOP RECORDER INSERTION N/A 11/18/2016   Procedure: Loop Recorder Insertion;  Surgeon: Kelsie Agent, MD;  Location: [facility] INVASIVE CV LAB;  Service: Cardiovascular;  Laterality: N/A;   PALATE / UVULA BIOPSY / EXCISION  2004   per Dr. Ethyl, for snoring    RADIAL KERATOTOMY     sees Dr. Ismael Boll as FU  at [facility] , bilateral,  dr stonecipher did the RK    repair detached retina     TEE WITHOUT CARDIOVERSION N/A 11/18/2016   Procedure: TRANSESOPHAGEAL ECHOCARDIOGRAM (TEE);  Surgeon: Alveta Aleene PARAS, MD;  Location: [facility] ENDOSCOPY;  Service: Cardiovascular;  Laterality: N/A;   TONSILLECTOMY     Social History   Occupational History   Not on file  Tobacco Use   Smoking status: Former    Current packs/day: 0.00    Types: Cigarettes    Quit date: 05/19/1980    Years since quitting: 43.0   Smokeless tobacco: Never  Vaping Use   Vaping status: Never Used  Substance and Sexual Activity   Alcohol use: Yes    Alcohol/week: 2.0 standard drinks of alcohol    Types: 1 Glasses of wine, 1 Cans of beer per week    Comment: 2 glasses of wine each day   Drug use: No   Sexual activity: Not on file

## 2023-06-15 DIAGNOSIS — K080 Exfoliation of teeth due to systemic causes: Secondary | ICD-10-CM | POA: Diagnosis not present

## 2023-06-18 ENCOUNTER — Telehealth: Payer: Self-pay

## 2023-06-18 DIAGNOSIS — L853 Xerosis cutis: Secondary | ICD-10-CM | POA: Diagnosis not present

## 2023-06-18 DIAGNOSIS — L821 Other seborrheic keratosis: Secondary | ICD-10-CM | POA: Diagnosis not present

## 2023-06-18 DIAGNOSIS — L570 Actinic keratosis: Secondary | ICD-10-CM | POA: Diagnosis not present

## 2023-06-18 DIAGNOSIS — D225 Melanocytic nevi of trunk: Secondary | ICD-10-CM | POA: Diagnosis not present

## 2023-06-18 NOTE — Telephone Encounter (Signed)
Called to discuss Watchman. Left message that the patient will be called Monday.  MyChart message sent as well.

## 2023-06-22 ENCOUNTER — Encounter: Payer: Self-pay | Admitting: Cardiology

## 2023-06-22 NOTE — Telephone Encounter (Signed)
Left message to call back if he still wishes to discuss LAAO.

## 2023-07-15 ENCOUNTER — Encounter: Payer: Self-pay | Admitting: Sports Medicine

## 2023-07-27 ENCOUNTER — Encounter: Payer: Self-pay | Admitting: Cardiovascular Disease

## 2023-07-28 ENCOUNTER — Other Ambulatory Visit (INDEPENDENT_AMBULATORY_CARE_PROVIDER_SITE_OTHER): Payer: Self-pay

## 2023-07-28 ENCOUNTER — Ambulatory Visit: Payer: Medicare Other | Admitting: Sports Medicine

## 2023-07-28 ENCOUNTER — Encounter: Payer: Self-pay | Admitting: Sports Medicine

## 2023-07-28 DIAGNOSIS — M19079 Primary osteoarthritis, unspecified ankle and foot: Secondary | ICD-10-CM | POA: Diagnosis not present

## 2023-07-28 DIAGNOSIS — M79674 Pain in right toe(s): Secondary | ICD-10-CM

## 2023-07-28 DIAGNOSIS — M216X1 Other acquired deformities of right foot: Secondary | ICD-10-CM | POA: Diagnosis not present

## 2023-07-28 DIAGNOSIS — I480 Paroxysmal atrial fibrillation: Secondary | ICD-10-CM

## 2023-07-28 DIAGNOSIS — M205X1 Other deformities of toe(s) (acquired), right foot: Secondary | ICD-10-CM

## 2023-07-28 DIAGNOSIS — Z8673 Personal history of transient ischemic attack (TIA), and cerebral infarction without residual deficits: Secondary | ICD-10-CM

## 2023-07-28 NOTE — Progress Notes (Addendum)
 Michael Lambert - 68 y.o. male MRN [id number]  Date of birth: 1955-05-23  Office Visit Note: Visit Date: 07/28/2023 PCP: Blair Heys, MD (Inactive) Referred by: No ref. provider found  Subjective: Chief Complaint  Patient presents with   Right Foot - Pain   HPI: Michael Lambert is a pleasant 68 y.o. male who presents today for acute on chronic great toe and foot pain.  He has had a long history of great toe pain -he states about 30 years ago he did have a surgery on the great toe, believes they may have put a spacer in the toe.  He has seen podiatry in the past with severe arthritis about the MTP joint.  The toe is painful and rigid.  He also is having crossover of the second and third toe which go underneath the big toe and cause skin irritation.  He has tried silicone spacers in the past but currently wears toe socks to help prevent against friction.  He takes very infrequent ibuprofen given his concomitant Eliquis use but states this helps greatly with his pains when taking this.  He has a history of TIA years ago, previously then diagnosed with paroxysmal atrial fibrillation and is status post ablation which was successful. However, he still remains on Eliquis 5 mg twice daily because of prior TIA and with instruction of his cardiologist.  He has discussed with a cardiologist possibly proceeding with a Watchman device to come off of his blood thinner with the possibility of being able to take more NSAIDs which do help his pain a lot.  Pertinent ROS were reviewed with the patient and found to be negative unless otherwise specified above in HPI.   Assessment & Plan: Visit Diagnoses:  1. Arthritis of metatarsophalangeal (MTP) joint of great toe   2. Great toe pain, right   3. Crossover toe deformity of right foot   4. Loss of transverse plantar arch of right foot   5. History of TIA (transient ischemic attack)    Plan: Impression is severe osteoarthritis with hallux rigidus of the great  toe, he does have significant cortical changes which are likely also posttraumatic with history of surgery 30 years ago.  Given the breakdown of the joint he is also having compensatory crossover toe deformity of both the second and third toe which are causing friction and irritation.  This is in lieu of his transverse arch loss.  He does have a metatarsal cookie in place which has helped slightly but still having recurrent pain about the great toe.  He does have paroxysmal atrial fibrillation and needs to continue on his Eliquis 5 mg twice daily.  We did discuss this and possibly proceeding with Watchman device placement with cardiology, as this would enable him to come off of his Eliquis and possibly use NSAIDs to help with his pain, as infrequent use of ibuprofen has significantly helped his toe/foot pain and other joint ailments.  He will have this further discussion with his cardiologist.   He does have an appointment in a few weeks with Dr. Victorino Dike to discuss the great toe and I recommended he follow through with this as I think surgical options such as first MTP joint arthrodesis or other surgical measures will be required for his great toe.  I would like his opinion as well on possible surgical treatments that could be possible for his crossover second and third toe such as straightening, plantar plate repair or others.   I did place an  EVA padding underneath this first toe to help with spring and mobility about the great toe.  We could consider placing a larger metatarsal cookie as well but I would like to see what surgery or other treatment options Dr. Victorino Dike recommends.  He may follow-up with me after this appointment as needed.  Follow-up: Return for Will seek surgical evaluation first, may follow-up after as needed.   Meds & Orders: No orders of the defined types were placed in this encounter.   Orders Placed This Encounter  Procedures   XR Toe Great Right     Procedures: No procedures  performed      Clinical History: No specialty comments available.  He reports that he quit smoking about 43 years ago. His smoking use included cigarettes. He has never used smokeless tobacco. No results for input(s): "HGBA1C", "LABURIC" in the last 8760 hours.  Objective:    Physical Exam  Gen: Well-appearing, in no acute distress; non-toxic CV: Well-perfused. Warm.  Resp: Breathing unlabored on room air; no wheezing. Psych: Fluid speech in conversation; appropriate affect; normal thought process  Ortho Exam - Right great toe: There is bony bossing and early bunion deformity of the great toe, most notably hallux rigidus.  No redness swelling or effusion.  - Foot/gait analysis: There is right greater than left transverse arch loss with splaying of toes 2-3 and 3-4 of the right foot.  There is early bunion deformity of the great toe with crossover or under toeing of both the second and third toes.  Imaging: No results found.   Past Medical/Family/Surgical/Social History: Medications & Allergies reviewed per EMR, new medications updated. Patient Active Problem List   Diagnosis Date Noted   Primary osteoarthritis of right knee 05/02/2021   Shoulder impingement syndrome, left 03/07/2021   Secondary hypercoagulable state (HCC) 07/31/2020   Coronary artery disease involving native coronary artery of native heart without angina pectoris 04/24/2019   Essential hypertension 04/24/2019   Exertional chest pain 12/01/2018   MRI of brain abnormal 05/14/2017   Pituitary cyst (HCC) 05/14/2017   Paroxysmal atrial fibrillation (HCC) 03/19/2017   Long term current use of anticoagulant 03/19/2017   PVC's (premature ventricular contractions) 03/19/2017   History of TIA (transient ischemic attack)    Migraine with aura 11/04/2016   Premature atrial contractions 05/29/2016   Left atrial dilatation 05/29/2016   Diarrhea 04/23/2016   Palpitations 01/28/2016   Anxiety 10/22/2011   URI (upper  respiratory infection) 08/05/2010   HEMATOMA 02/26/2010   Hyperlipidemia 01/11/2007   GERD 01/11/2007   BENIGN PROSTATIC HYPERTROPHY 01/11/2007   HEADACHE 01/11/2007   Past Medical History:  Diagnosis Date   Anxiety    sees Dr. Phillip Heal    Atrial fibrillation [facility])    on Xarelto daily   Benign prostatic hypertrophy    GERD (gastroesophageal reflux disease)    Hyperlipidemia    Migraine    Stroke (HCC)    TIA memorial day 2018   Family History  Problem Relation Age of Onset   Multiple sclerosis Other        family hx   Hyperlipidemia Father    Ovarian cancer Mother    Colon cancer Neg Hx    Esophageal cancer Neg Hx    Rectal cancer Neg Hx    Stomach cancer Neg Hx    Colon polyps Neg Hx    Past Surgical History:  Procedure Laterality Date   APPENDECTOMY     ATRIAL FIBRILLATION ABLATION N/A 07/03/2020   Procedure:  ATRIAL FIBRILLATION ABLATION;  Surgeon: Hillis Range, MD;  Location: [facility] INVASIVE [facility] LAB;  Service: Cardiovascular;  Laterality: N/A;   colonoscopy  10/08/2012   per Dr. Arlyce Dice, adenomatous polyps, repeat in 5 yrs    COLONOSCOPY     CYSTOSCOPY     per Dr. Earlene Plater-    gum graft  08/2009   Dr Juluis Pitch    HERNIA REPAIR  2008   bilateral inguinal, per Dr. Abbey Chatters   implantable loop recorder removal  04/17/2021   MDT Reveal LINQ removed   LOOP RECORDER INSERTION N/A 11/18/2016   Procedure: Loop Recorder Insertion;  Surgeon: Hillis Range, MD;  Location: [facility] CV LAB;  Service: Cardiovascular;  Laterality: N/A;   PALATE / UVULA BIOPSY / EXCISION  2004   per Dr. Ezzard Standing, for snoring    RADIAL KERATOTOMY     sees Dr. Ladoris Gene as FU  at [facility] , bilateral,  dr stonecipher did the RK    repair detached retina     TEE WITHOUT CARDIOVERSION N/A 11/18/2016   Procedure: TRANSESOPHAGEAL ECHOCARDIOGRAM (TEE);  Surgeon: Elease Hashimoto Deloris Ping, MD;  Location: [facility] ENDOSCOPY;  Service: Cardiovascular;  Laterality: N/A;   TONSILLECTOMY     Social History    Occupational History   Not on file  Tobacco Use   Smoking status: Former    Current packs/day: 0.00    Types: Cigarettes    Quit date: 05/19/1980    Years since quitting: 43.2   Smokeless tobacco: Never  Vaping Use   Vaping status: Never Used  Substance and Sexual Activity   Alcohol use: Yes    Alcohol/week: 2.0 standard drinks of alcohol    Types: 1 Glasses of wine, 1 Cans of beer per week    Comment: 2 glasses of wine each day   Drug use: No   Sexual activity: Not on file

## 2023-07-29 ENCOUNTER — Other Ambulatory Visit: Payer: Self-pay

## 2023-07-29 DIAGNOSIS — I2510 Atherosclerotic heart disease of native coronary artery without angina pectoris: Secondary | ICD-10-CM

## 2023-07-29 NOTE — Progress Notes (Unsigned)
 Exercise stress test ordered per Dr. Erin Hearing request. Attestation placed and pended.

## 2023-07-29 NOTE — Telephone Encounter (Signed)
 Can we please schedule for a plain treadmill ECG stress test. Re: CAD

## 2023-08-05 ENCOUNTER — Encounter: Payer: Self-pay | Admitting: Cardiology

## 2023-08-07 ENCOUNTER — Telehealth: Payer: Self-pay | Admitting: Cardiovascular Disease

## 2023-08-07 NOTE — Telephone Encounter (Signed)
 3/21 called and left VM for patient to call back to schedule gxt.MS

## 2023-08-13 DIAGNOSIS — J069 Acute upper respiratory infection, unspecified: Secondary | ICD-10-CM | POA: Diagnosis not present

## 2023-08-17 DIAGNOSIS — N401 Enlarged prostate with lower urinary tract symptoms: Secondary | ICD-10-CM | POA: Diagnosis not present

## 2023-08-18 ENCOUNTER — Ambulatory Visit

## 2023-08-19 DIAGNOSIS — M25374 Other instability, right foot: Secondary | ICD-10-CM | POA: Diagnosis not present

## 2023-08-19 DIAGNOSIS — M2031 Hallux varus (acquired), right foot: Secondary | ICD-10-CM | POA: Diagnosis not present

## 2023-08-19 DIAGNOSIS — M2022 Hallux rigidus, left foot: Secondary | ICD-10-CM | POA: Diagnosis not present

## 2023-08-19 DIAGNOSIS — M7741 Metatarsalgia, right foot: Secondary | ICD-10-CM | POA: Diagnosis not present

## 2023-08-24 DIAGNOSIS — R351 Nocturia: Secondary | ICD-10-CM | POA: Diagnosis not present

## 2023-08-24 DIAGNOSIS — N401 Enlarged prostate with lower urinary tract symptoms: Secondary | ICD-10-CM | POA: Diagnosis not present

## 2023-08-25 ENCOUNTER — Ambulatory Visit: Attending: Cardiology

## 2023-08-25 DIAGNOSIS — I2510 Atherosclerotic heart disease of native coronary artery without angina pectoris: Secondary | ICD-10-CM | POA: Diagnosis not present

## 2023-08-26 ENCOUNTER — Encounter: Payer: Self-pay | Admitting: Cardiovascular Disease

## 2023-08-26 LAB — EXERCISE TOLERANCE TEST
Angina Index: 0
Base ST Depression (mm): 0 mm
Duke Treadmill Score: 5
Estimated workload: 11.7
Exercise duration (min): 10 min
Exercise duration (sec): 0 s
MPHR: 151 {beats}/min
Peak HR: 142 {beats}/min
Percent HR: 94 %
RPE: 17
Rest HR: 57 {beats}/min
ST Depression (mm): 1 mm

## 2023-09-07 ENCOUNTER — Other Ambulatory Visit: Payer: Self-pay

## 2023-09-07 DIAGNOSIS — I480 Paroxysmal atrial fibrillation: Secondary | ICD-10-CM

## 2023-09-08 ENCOUNTER — Telehealth: Payer: Self-pay

## 2023-09-08 DIAGNOSIS — I480 Paroxysmal atrial fibrillation: Secondary | ICD-10-CM

## 2023-09-08 NOTE — Telephone Encounter (Signed)
-----   Message from Leanora Prophet [facility] sent at 09/08/2023  3:53 PM EDT ----- Lenard Quam, I just need a repeat TTE. No need for repeat CT. Thanks, Donelda Fujita ----- Message ----- From: Allison Ivory, RN Sent: 09/07/2023   4:05 PM EDT To: Boyce Byes, MD  Hey! Mr. Giovanetti is ready to schedule his LAAO (you saw him in December 2024). Your note says to repeat echo and cCT. Wanted to make sure you saw the CT/TruPlan (CT was completed in 2022) and confirm you still want repeat cCT.  Thank you!  KK

## 2023-09-08 NOTE — Telephone Encounter (Signed)
 Per Dr. Marven Slimmer, scheduled the patient for repeat echo on 10/13/2023. He was grateful for call and agreed with plan.

## 2023-10-12 ENCOUNTER — Encounter: Payer: Self-pay | Admitting: Sports Medicine

## 2023-10-13 ENCOUNTER — Ambulatory Visit (HOSPITAL_COMMUNITY)
Admission: RE | Admit: 2023-10-13 | Discharge: 2023-10-13 | Disposition: A | Discharge status: Home / Self Care | Source: Ambulatory Visit | Attending: Cardiology | Admitting: Cardiology

## 2023-10-13 DIAGNOSIS — I480 Paroxysmal atrial fibrillation: Secondary | ICD-10-CM | POA: Insufficient documentation

## 2023-10-13 LAB — ECHOCARDIOGRAM COMPLETE
Area-P 1/2: 2.94 cm2
S' Lateral: 2.6 cm

## 2023-10-14 ENCOUNTER — Other Ambulatory Visit: Payer: Self-pay | Admitting: Sports Medicine

## 2023-10-14 DIAGNOSIS — G8929 Other chronic pain: Secondary | ICD-10-CM

## 2023-10-14 DIAGNOSIS — M47816 Spondylosis without myelopathy or radiculopathy, lumbar region: Secondary | ICD-10-CM

## 2023-10-19 ENCOUNTER — Telehealth: Payer: Self-pay

## 2023-10-19 NOTE — Telephone Encounter (Signed)
 Michael Lambert

## 2023-10-26 ENCOUNTER — Encounter: Payer: Self-pay | Admitting: Sports Medicine

## 2023-10-27 ENCOUNTER — Other Ambulatory Visit: Payer: Self-pay | Admitting: Sports Medicine

## 2023-10-27 ENCOUNTER — Encounter: Payer: Self-pay | Admitting: Sports Medicine

## 2023-10-27 MED ORDER — DIAZEPAM 5 MG PO TABS: ORAL_TABLET | ORAL | 0 refills | Status: DC

## 2023-10-28 ENCOUNTER — Ambulatory Visit
Admission: RE | Admit: 2023-10-28 | Discharge: 2023-10-28 | Discharge status: Home / Self Care | Source: Ambulatory Visit | Attending: Sports Medicine

## 2023-10-28 ENCOUNTER — Telehealth: Payer: Self-pay | Admitting: Sports Medicine

## 2023-10-28 DIAGNOSIS — M545 Low back pain, unspecified: Secondary | ICD-10-CM

## 2023-10-28 DIAGNOSIS — M47816 Spondylosis without myelopathy or radiculopathy, lumbar region: Secondary | ICD-10-CM | POA: Diagnosis not present

## 2023-10-28 NOTE — Telephone Encounter (Signed)
 Micole called from [facility] and needs to know the diagnosis for his medication. [id number]  option 5

## 2023-10-28 NOTE — Telephone Encounter (Signed)
 Called and confirmed that prescription is for pre-sedation for MRI.

## 2023-10-29 ENCOUNTER — Other Ambulatory Visit: Payer: Self-pay | Admitting: Sports Medicine

## 2023-10-29 MED ORDER — PREDNISONE 20 MG PO TABS
20.0000 mg | ORAL_TABLET | Freq: Every day | ORAL | 0 refills | Status: AC
Start: 2023-10-29 — End: 2023-11-05

## 2023-11-04 ENCOUNTER — Ambulatory Visit: Attending: Internal Medicine | Admitting: Audiologist

## 2023-11-04 DIAGNOSIS — H903 Sensorineural hearing loss, bilateral: Secondary | ICD-10-CM | POA: Insufficient documentation

## 2023-11-04 NOTE — Procedures (Signed)
 Outpatient Audiology and [facility] [address] [place], [place]  [address] [phone]  AUDIOLOGICAL  EVALUATION  NAME: Michael Lambert     DOB:   [birth year]      MRN: [id number]                                                                                     DATE: 11/04/2023     REFERENT: Jearldine Mina, MD STATUS: Outpatient DIAGNOSIS: Sensorineural Hearing Loss Bilateral    History: Malakie was seen for an audiological evaluation due to difficulty hearing in background noise when lots of people are talking. Beaumont has a left ear Phonak hearing aid with an open dome. Donnis does not feel it provides much benefit. He likes the connectivity with the phone. Matteus tried new Oticon hearing aids but did not see an improvement. He is looking for a second opinion on his hearing test.  Patric denies pain, pressure, or tinnitus. No significant history of hazardous noise exposure.  Medical history shows no additional risk for hearing loss.    Evaluation:  Otoscopy showed a clear view of the tympanic membranes, bilaterally Tympanometry results were consistent with normal middle ear function, bilaterally   Audiometric testing was completed using Conventional Audiometry techniques with insert earphones and supraural headphones. Test results are consistent with normal hearing 250-2kHz sloping 3-8kHz to a mild to moderate sensorineural hearing loss. Speech Recognition Thresholds were obtained at 20dB HL in the right ear and at 20dB HL in the left ear. Word Recognition Testing was completed at  40dB SL and Nettie scored 100% in each ear. Quick Speech in Noise Test (QuickSIN):  list of six sentences with five key words per sentence is presented in four-talker babble noise. The sentences are presented at pre- recorded signal-to-noise ratios which decrease in 5-dB steps from 25 (very easy) to 0 (extremely difficult). The SNRs used are: 25, 20, 15, 10, 5 and 0, encompassing normal to severely impaired  performance in noise. Taxes binaural separation and discrimination skills. Dakin performed in the normal range for both ears.  Hamish scored 1.5dB SNR loss.     Results:  The test results were reviewed with Autry Legions. He has normal hearing 250-2kHz sloping to a mild to moderate sensorineural hearing loss bilaterally. He is not a great hearing aid candidate due to his mostly normal hearing in each ear. He is likely having normal difficulty hearing in noisy places like restaurants, some places are hard to hear no matter what. If he wants to try hearing aids he certainly can, but recommend he try a practice that has multiple brands such as the [facility].  Audiogram printed and provided to Gracyn.    Recommendations: Hearing aids not recommended for both ears due to mostly normal hearing but he is welcome to try aids for connectivity to his phone and help in noisy restaurants. Patient given list of local hearing aid providers.  Annual audiometric testing recommended to monitor hearing loss for progression.    28 minutes spent testing and counseling on results.   If you have any questions please feel free to contact me at [phone].  Raynald Calkins Stalnaker Au.D.  Audiologist   11/04/2023  9:39 AM  Cc: Husain, Karrar, MD

## 2023-11-05 DIAGNOSIS — I10 Essential (primary) hypertension: Secondary | ICD-10-CM | POA: Diagnosis not present

## 2023-11-05 DIAGNOSIS — I4891 Unspecified atrial fibrillation: Secondary | ICD-10-CM | POA: Diagnosis not present

## 2023-11-05 DIAGNOSIS — I2510 Atherosclerotic heart disease of native coronary artery without angina pectoris: Secondary | ICD-10-CM | POA: Diagnosis not present

## 2023-11-06 ENCOUNTER — Ambulatory Visit: Admitting: Sports Medicine

## 2023-11-06 ENCOUNTER — Encounter: Payer: Self-pay | Admitting: Sports Medicine

## 2023-11-06 ENCOUNTER — Other Ambulatory Visit: Payer: Self-pay | Admitting: Sports Medicine

## 2023-11-06 DIAGNOSIS — M47816 Spondylosis without myelopathy or radiculopathy, lumbar region: Secondary | ICD-10-CM

## 2023-11-06 DIAGNOSIS — M51369 Other intervertebral disc degeneration, lumbar region without mention of lumbar back pain or lower extremity pain: Secondary | ICD-10-CM

## 2023-11-06 DIAGNOSIS — G8929 Other chronic pain: Secondary | ICD-10-CM

## 2023-11-06 DIAGNOSIS — M545 Low back pain, unspecified: Secondary | ICD-10-CM

## 2023-11-06 DIAGNOSIS — M533 Sacrococcygeal disorders, not elsewhere classified: Secondary | ICD-10-CM

## 2023-11-06 MED ORDER — TRAMADOL HCL 50 MG PO TABS: 50.0000 mg | ORAL_TABLET | Freq: Four times a day (QID) | ORAL | 0 refills | Status: AC | PRN

## 2023-11-06 MED ORDER — CYCLOBENZAPRINE HCL 5 MG PO TABS: 5.0000 mg | ORAL_TABLET | Freq: Two times a day (BID) | ORAL | 0 refills | Status: DC | PRN

## 2023-11-06 NOTE — Progress Notes (Signed)
 See remainder of note from today for A/P.  - Flexeril  and Tramadol PRN sent in to pharmacy

## 2023-11-06 NOTE — Progress Notes (Signed)
 Patient says that his left-sided low back pain flared up in the middle of May. He has continued doing his home exercises in the mornings, and has taken Tylenol  and Ibuprofen  with very little relief. He did take Prednisone  prior to his MRI which alleviated other pain, but did not give him any relief of his back pain. He denies any symptoms down the left leg, but does have pain that seems to wrap around to the front of the hip. He drove 9 hours about 2 weeks ago which did not seem to make his pain worse. He uses ice and occasionally uses heat, which both give him temporary relief.

## 2023-11-06 NOTE — Progress Notes (Signed)
 Adorian Gwynne - 68 y.o. male MRN [id number]  Date of birth: Aug 14, 1955  Office Visit Note: Visit Date: 11/06/2023 PCP: Jearldine Mina, MD Referred by: No ref. provider found  Subjective: Chief Complaint  Patient presents with   Lower Back - Follow-up   HPI: Sallie Maker is a pleasant 68 y.o. male who presents today for follow-up of left-sided low back/posterior hip pain and MRI review.  Tabius has had some back issues before but the left side of the low back has been flared up and exacerbated since May.  He has been given some home exercises in the past as well as from me for his hips which she has been continuing in the mornings.  His pain has become progressive and has been quite bothersome recently, points to this over the left side of the low back in the posterior ilium.  He denies any changes in bowel or bladder habits.  He has taken both Tylenol  and ibuprofen  with little relief.  We did send in some prednisone  prior to his MRI which helped his overall body pain but did not give him much relief in terms of his back pain.  The pain is localized to the left low back but will wrap around the lateral in the front part of the hip.  He does not have any radicular symptoms going down the leg.  Pertinent ROS were reviewed with the patient and found to be negative unless otherwise specified above in HPI.   Assessment & Plan: Visit Diagnoses:  1. Chronic left-sided low back pain without sciatica   2. Sacroiliac joint dysfunction of left side   3. Facet arthritis of lumbar region   4. Bulging of lumbar intervertebral disc    Plan: Impression is chronic and progressive left-sided low back and posterior SI joint pain.  We did review his lumbar MRI which shows a few small lumbar disc bulges but no neural compression or findings that would specifically relate to his left-sided low back pain.  Initially I thought this may be from facet related arthritic change, but he has no pain today with endrange  extension and no significant facet arthropathy on MRI.  More of his symptoms seem to emanate around the left SI joint and the posterior ilium.  Discussed with Uri this is the most likely given his symptoms and with his imaging review, although his exact diagnosis is still not certain.  We discussed trialing a diagnostic and hopefully therapeutic SI joint injection in the near future.  In the meantime for his acute pain, he may use Flexeril  5-10 mg 2-3 times daily as needed.  I am okay with sending in just a short prescription of tramadol to take for breakthrough pain.  He will hold from any consistent NSAID use given his Eliquis  use.  Follow-up:   Meds & Orders: No orders of the defined types were placed in this encounter.  No orders of the defined types were placed in this encounter.    Procedures: No procedures performed      Clinical History: No specialty comments available.  He reports that he quit smoking about 43 years ago. His smoking use included cigarettes. He has never used smokeless tobacco. No results for input(s): HGBA1C, LABURIC in the last 8760 hours.  Objective:    Physical Exam  Gen: Well-appearing, in no acute distress; non-toxic CV: Well-perfused. Warm.  Resp: Breathing unlabored on room air; no wheezing. Psych: Fluid speech in conversation; appropriate affect; normal thought process  Ortho Exam -  Lumbar/Pelvis: There is no midline spinous process TTP over the lumbar spine.  No significant tenderness or Fortin's point test of the left or right SI joint but there is some mild aggravation with FABER and Gaenslen's test that radiates to the posterior lateral ilium on the left side.  There is some muscular hypertonicity in this location.  Negative straight leg raise bilaterally.  There is no pain with internal or external logroll about the hip.  Negative FADIR test, negative Stinchfield test.  There is good hip abduction strength on the left and right side.    Imaging:  MR Lumbar Spine w/o contrast CLINICAL DATA:  Chronic low back pain. Facet arthritis. Left-sided low back pain without sciatica for 2 months. No known injury.  EXAM: MRI LUMBAR SPINE WITHOUT CONTRAST  TECHNIQUE: Multiplanar, multisequence MR imaging of the lumbar spine was performed. No intravenous contrast was administered.  COMPARISON:  Lumbar spine radiographs 03/10/2023.  FINDINGS: Segmentation: Conventional anatomy assumed, with the last open disc space designated L5-S1.Concordant with prior radiographs.  Alignment:  Physiologic.  Vertebrae: No worrisome osseous lesion, acute fracture or pars defect.  Conus medullaris: Extends to the L1-2 level. The conus and cauda equina appear normal.  Paraspinal and other soft tissues: No significant paraspinal findings.  Disc levels:  Sagittal images demonstrate no significant disc space findings within the visualized lower thoracic spine.  L1-2: Preserved disc height with mild disc bulging and anterior osteophytes. No spinal stenosis or foraminal narrowing.  L2-3: Preserved disc height. Mild disc bulging and bilateral facet hypertrophy. No significant spinal stenosis or foraminal narrowing.  L3-4: Mild loss of disc height with mild disc bulging eccentric to the left. Mild to moderate facet and ligamentous hypertrophy. The spinal canal and foramina are patent. There is minimal narrowing of the lateral recesses without L4 nerve root encroachment.  L4-5: Mild loss of disc height with annular disc bulging and endplate osteophytes asymmetric to the left. Mild to moderate facet and ligamentous hypertrophy. No significant spinal stenosis or foraminal narrowing.  L5-S1: Preserved disc height and hydration. Mild bilateral facet hypertrophy. No significant spinal stenosis or foraminal narrowing.  IMPRESSION: 1. Mild lumbar spondylosis with disc bulging and facet hypertrophy as described. No significant spinal  stenosis or foraminal narrowing. 2. No acute findings or clear explanation for the patient's symptoms.  Electronically Signed   By: Elmon Hagedorn M.D.   On: 11/05/2023 16:18  Past Medical/Family/Surgical/Social History: Medications & Allergies reviewed per EMR, new medications updated. Patient Active Problem List   Diagnosis Date Noted   Primary osteoarthritis of right knee 05/02/2021   Shoulder impingement syndrome, left 03/07/2021   Secondary hypercoagulable state (HCC) 07/31/2020   Coronary artery disease involving native coronary artery of native heart without angina pectoris 04/24/2019   Essential hypertension 04/24/2019   Exertional chest pain 12/01/2018   MRI of brain abnormal 05/14/2017   Pituitary cyst (HCC) 05/14/2017   Paroxysmal atrial fibrillation (HCC) 03/19/2017   Long term current use of anticoagulant 03/19/2017   PVC's (premature ventricular contractions) 03/19/2017   History of TIA (transient ischemic attack)    Migraine with aura 11/04/2016   Premature atrial contractions 05/29/2016   Left atrial dilatation 05/29/2016   Diarrhea 04/23/2016   Palpitations 01/28/2016   Anxiety 10/22/2011   URI (upper respiratory infection) 08/05/2010   HEMATOMA 02/26/2010   Hyperlipidemia 01/11/2007   GERD 01/11/2007   BENIGN PROSTATIC HYPERTROPHY 01/11/2007   HEADACHE 01/11/2007   Past Medical History:  Diagnosis Date   Anxiety  sees Dr. Viveca Grist    Atrial fibrillation [facility])    on Xarelto  daily   Benign prostatic hypertrophy    GERD (gastroesophageal reflux disease)    Hyperlipidemia    Migraine    Stroke (HCC)    TIA memorial day 2018   Family History  Problem Relation Age of Onset   Multiple sclerosis Other        family hx   Hyperlipidemia Father    Ovarian cancer Mother    Colon cancer Neg Hx    Esophageal cancer Neg Hx    Rectal cancer Neg Hx    Stomach cancer Neg Hx    Colon polyps Neg Hx    Past Surgical History:  Procedure Laterality  Date   APPENDECTOMY     ATRIAL FIBRILLATION ABLATION N/A 07/03/2020   Procedure: ATRIAL FIBRILLATION ABLATION;  Surgeon: Jolly Needle, MD;  Location: [facility] INVASIVE CV LAB;  Service: Cardiovascular;  Laterality: N/A;   colonoscopy  10/08/2012   per Dr. Arvie Latus, adenomatous polyps, repeat in 5 yrs    COLONOSCOPY     CYSTOSCOPY     per Dr. Nolon Baxter-    gum graft  08/2009   Dr Doroteo Gasmen    HERNIA REPAIR  2008   bilateral inguinal, per Dr. Lynnell Sato   implantable loop recorder removal  04/17/2021   MDT Reveal LINQ removed   LOOP RECORDER INSERTION N/A 11/18/2016   Procedure: Loop Recorder Insertion;  Surgeon: Jolly Needle, MD;  Location: [facility]VASIVE CV LAB;  Service: Cardiovascular;  Laterality: N/A;   PALATE / UVULA BIOPSY / EXCISION  2004   per Dr. Odean Bend, for snoring    RADIAL KERATOTOMY     sees Dr. Sundra Engel as FU  at [facility] , bilateral,  dr stonecipher did the RK    repair detached retina     TEE WITHOUT CARDIOVERSION N/A 11/18/2016   Procedure: TRANSESOPHAGEAL ECHOCARDIOGRAM (TEE);  Surgeon: Alroy Aspen Lela Purple, MD;  Location: [facility] ENDOSCOPY;  Service: Cardiovascular;  Laterality: N/A;   TONSILLECTOMY     Social History   Occupational History   Not on file  Tobacco Use   Smoking status: Former    Current packs/day: 0.00    Types: Cigarettes    Quit date: 05/19/1980    Years since quitting: 43.4   Smokeless tobacco: Never  Vaping Use   Vaping status: Never Used  Substance and Sexual Activity   Alcohol use: Yes    Alcohol/week: 2.0 standard drinks of alcohol    Types: 1 Glasses of wine, 1 Cans of beer per week    Comment: 2 glasses of wine each day   Drug use: No   Sexual activity: Not on file

## 2023-11-09 ENCOUNTER — Encounter: Payer: Self-pay | Admitting: Sports Medicine

## 2023-11-09 ENCOUNTER — Other Ambulatory Visit: Payer: Self-pay

## 2023-11-09 ENCOUNTER — Ambulatory Visit: Admitting: Sports Medicine

## 2023-11-09 DIAGNOSIS — G8929 Other chronic pain: Secondary | ICD-10-CM | POA: Diagnosis not present

## 2023-11-09 DIAGNOSIS — M7918 Myalgia, other site: Secondary | ICD-10-CM

## 2023-11-09 DIAGNOSIS — M533 Sacrococcygeal disorders, not elsewhere classified: Secondary | ICD-10-CM

## 2023-11-09 DIAGNOSIS — M545 Low back pain, unspecified: Secondary | ICD-10-CM | POA: Diagnosis not present

## 2023-11-09 NOTE — Progress Notes (Signed)
 Michael Lambert - 68 y.o. male MRN [id number]  Date of birth: 11/06/1955  Office Visit Note: Visit Date: 11/09/2023 PCP: Ransom Other, MD Referred by: Ransom Other, MD  Subjective: Chief Complaint  Patient presents with   Lower Back - Follow-up   HPI: Michael Lambert is a pleasant 68 y.o. male who presents today for follow-up of chronic left-sided low back and SI joint dysfunction.  Over the weekend, he was able to take the Flexeril  which helped relieve some of his muscle tightness and helped him sleep which was helpful.  He did have to take tramadol  over the weekend for breakthrough pain.  He is still having pain but is definitely feeling better compared to last week.  He is interested in proceeding with an injection.  We did review his home exercises today as well and did provide guidance, see below.  Pertinent ROS were reviewed with the patient and found to be negative unless otherwise specified above in HPI.   Assessment & Plan: Visit Diagnoses:  1. Sacroiliac joint dysfunction of left side   2. Left buttock pain   3. Chronic left-sided low back pain without sciatica    Plan: Impression is chronic left-sided low back pain and SI joint pain/dysfunction.  He does have a few small bulging disks of the lumbar spine but no specific neural compression.  More of his pain seems to emanate from the posterior complex of the left SI joint.  Through shared decision making for both diagnostic and therapeutic purposes, we did proceed with an ultrasound-guided left SI joint injection, patient tolerated well.  Advised on postinjection protocol.  I would like him to get started in SI joint and low back specific exercises.  He is agreeable to home therapy.  We did print out a customized handout and my athletic trainer, Jinnie, did review these with him in the room today.  He will hold on all lower extremity and hip/pelvic based exercises until this upcoming weekend and then may begin these once daily.  He  will use his Flexeril  5-10 mg 2-3 daily as needed and does have tramadol  for breakthrough pain only.  He will send me a message in the next 7-10 days with a percentage of relief (0-100%).  This will help guide further treatment and evaluation.  Follow-up: Return for send mychart message with update in 1-week.   Meds & Orders: No orders of the defined types were placed in this encounter.   Orders Placed This Encounter  Procedures   US  Guided Needle Placement - No Linked Charges     Procedures: U/S-guided SI-joint injection, Left   After discussion of risk/benefits/indications, informed verbal consent was obtained. A timeout was then performed. The patient was positioned in a prone position on exam room table with a pillow placed under the pelvis for mild hip flexion. The SI joint area was cleaned and prepped with betadine and alcohol swabs. Sterile ultrasound gel was applied and the ultrasound transducer was placed in an anatomic axial plane over the PSIS, then moved distally over the SI-joint. Using ultrasound guidance, a 22-gauge, 3.5 needle was inserted from a medial to lateral approach utilizing an in-plane approach and directed into the SI-joint. The SI-joint was then injected with a mixture of 4:2 lidocaine :depomedrol with visualization of the injectate flow into the SI-joint under ultrasound visualization. The patient tolerated the procedure well without immediate complications.       Clinical History: No specialty comments available.  He reports that he quit smoking about  43 years ago. His smoking use included cigarettes. He has never used smokeless tobacco. No results for input(s): HGBA1C, LABURIC in the last 8760 hours.  Objective:    Physical Exam  Gen: Well-appearing, in no acute distress; non-toxic CV: Well-perfused. Warm.  Resp: Breathing unlabored on room air; no wheezing. Psych: Fluid speech in conversation; appropriate affect; normal thought process  Ortho Exam -  Lumbar/SI-joint: No specific midline spinous process TTP.  There is improved range of motion with flexion and extension without significant pain.  Negative SLR.  There is a degree of SI joint compression test and restriction with FABER testing.   Imaging:  MR Lumbar Spine w/o contrast CLINICAL DATA:  Chronic low back pain. Facet arthritis. Left-sided low back pain without sciatica for 2 months. No known injury.  EXAM: MRI LUMBAR SPINE WITHOUT CONTRAST  TECHNIQUE: Multiplanar, multisequence MR imaging of the lumbar spine was performed. No intravenous contrast was administered.  COMPARISON:  Lumbar spine radiographs 03/10/2023.  FINDINGS: Segmentation: Conventional anatomy assumed, with the last open disc space designated L5-S1.Concordant with prior radiographs.  Alignment:  Physiologic.  Vertebrae: No worrisome osseous lesion, acute fracture or pars defect.  Conus medullaris: Extends to the L1-2 level. The conus and cauda equina appear normal.  Paraspinal and other soft tissues: No significant paraspinal findings.  Disc levels:  Sagittal images demonstrate no significant disc space findings within the visualized lower thoracic spine.  L1-2: Preserved disc height with mild disc bulging and anterior osteophytes. No spinal stenosis or foraminal narrowing.  L2-3: Preserved disc height. Mild disc bulging and bilateral facet hypertrophy. No significant spinal stenosis or foraminal narrowing.  L3-4: Mild loss of disc height with mild disc bulging eccentric to the left. Mild to moderate facet and ligamentous hypertrophy. The spinal canal and foramina are patent. There is minimal narrowing of the lateral recesses without L4 nerve root encroachment.  L4-5: Mild loss of disc height with annular disc bulging and endplate osteophytes asymmetric to the left. Mild to moderate facet and ligamentous hypertrophy. No significant spinal stenosis or foraminal narrowing.  L5-S1:  Preserved disc height and hydration. Mild bilateral facet hypertrophy. No significant spinal stenosis or foraminal narrowing.  IMPRESSION: 1. Mild lumbar spondylosis with disc bulging and facet hypertrophy as described. No significant spinal stenosis or foraminal narrowing. 2. No acute findings or clear explanation for the patient's symptoms.  Electronically Signed   By: Elsie Perone M.D.   On: 11/05/2023 16:18  Past Medical/Family/Surgical/Social History: Medications & Allergies reviewed per EMR, new medications updated. Patient Active Problem List   Diagnosis Date Noted   Primary osteoarthritis of right knee 05/02/2021   Shoulder impingement syndrome, left 03/07/2021   Secondary hypercoagulable state (HCC) 07/31/2020   Coronary artery disease involving native coronary artery of native heart without angina pectoris 04/24/2019   Essential hypertension 04/24/2019   Exertional chest pain 12/01/2018   MRI of brain abnormal 05/14/2017   Pituitary cyst (HCC) 05/14/2017   Paroxysmal atrial fibrillation (HCC) 03/19/2017   Long term current use of anticoagulant 03/19/2017   PVC's (premature ventricular contractions) 03/19/2017   History of TIA (transient ischemic attack)    Migraine with aura 11/04/2016   Premature atrial contractions 05/29/2016   Left atrial dilatation 05/29/2016   Diarrhea 04/23/2016   Palpitations 01/28/2016   Anxiety 10/22/2011   URI (upper respiratory infection) 08/05/2010   HEMATOMA 02/26/2010   Hyperlipidemia 01/11/2007   GERD 01/11/2007   BENIGN PROSTATIC HYPERTROPHY 01/11/2007   HEADACHE 01/11/2007   Past Medical History:  Diagnosis Date   Anxiety    sees Dr. Slater Mam    Atrial fibrillation [facility])    on Xarelto  daily   Benign prostatic hypertrophy    GERD (gastroesophageal reflux disease)    Hyperlipidemia    Migraine    Stroke (HCC)    TIA memorial day 2018   Family History  Problem Relation Age of Onset   Multiple sclerosis Other         family hx   Hyperlipidemia Father    Ovarian cancer Mother    Colon cancer Neg Hx    Esophageal cancer Neg Hx    Rectal cancer Neg Hx    Stomach cancer Neg Hx    Colon polyps Neg Hx    Past Surgical History:  Procedure Laterality Date   APPENDECTOMY     ATRIAL FIBRILLATION ABLATION N/A 07/03/2020   Procedure: ATRIAL FIBRILLATION ABLATION;  Surgeon: Kelsie Agent, MD;  Location: [facility] INVASIVE CV LAB;  Service: Cardiovascular;  Laterality: N/A;   colonoscopy  10/08/2012   per Dr. Debrah, adenomatous polyps, repeat in 5 yrs    COLONOSCOPY     CYSTOSCOPY     per Dr. Nicholaus-    gum graft  08/2009   Dr Agent Bertin    HERNIA REPAIR  2008   bilateral inguinal, per Dr. Lily   implantable loop recorder removal  04/17/2021   MDT Reveal LINQ removed   LOOP RECORDER INSERTION N/A 11/18/2016   Procedure: Loop Recorder Insertion;  Surgeon: Kelsie Agent, MD;  Location: [facility] INVASIVE CV LAB;  Service: Cardiovascular;  Laterality: N/A;   PALATE / UVULA BIOPSY / EXCISION  2004   per Dr. Ethyl, for snoring    RADIAL KERATOTOMY     sees Dr. Ismael Boll as FU  at [facility] , bilateral,  dr stonecipher did the RK    repair detached retina     TEE WITHOUT CARDIOVERSION N/A 11/18/2016   Procedure: TRANSESOPHAGEAL ECHOCARDIOGRAM (TEE);  Surgeon: Alveta Aleene PARAS, MD;  Location: [facility] ENDOSCOPY;  Service: Cardiovascular;  Laterality: N/A;   TONSILLECTOMY     Social History   Occupational History   Not on file  Tobacco Use   Smoking status: Former    Current packs/day: 0.00    Types: Cigarettes    Quit date: 05/19/1980    Years since quitting: 43.5   Smokeless tobacco: Never  Vaping Use   Vaping status: Never Used  Substance and Sexual Activity   Alcohol use: Yes    Alcohol/week: 2.0 standard drinks of alcohol    Types: 1 Glasses of wine, 1 Cans of beer per week    Comment: 2 glasses of wine each day   Drug use: No   Sexual activity: Not on file

## 2023-11-09 NOTE — Progress Notes (Signed)
 Patient says that he is feeling better today than he was last week. He began having some relief on Saturday. He still has pain when he goes from seated to standing. He has not taken Tramadol  today.  Patient was instructed in 10 minutes of therapeutic exercises for SI joint to improve strength, ROM and function according to my instructions and plan of care by a Certified Athletic Trainer during the office visit. A customized handout was provided and demonstration of proper technique shown and discussed. Patient did perform exercises and demonstrate understanding through teachback.  All questions discussed and answered.

## 2023-11-10 ENCOUNTER — Encounter: Payer: Self-pay | Admitting: Cardiovascular Disease

## 2023-11-13 ENCOUNTER — Encounter: Payer: Self-pay | Admitting: Sports Medicine

## 2023-11-16 ENCOUNTER — Encounter: Payer: Self-pay | Admitting: Sports Medicine

## 2023-11-16 DIAGNOSIS — F411 Generalized anxiety disorder: Secondary | ICD-10-CM | POA: Diagnosis not present

## 2023-11-16 DIAGNOSIS — E7800 Pure hypercholesterolemia, unspecified: Secondary | ICD-10-CM | POA: Diagnosis not present

## 2023-11-16 DIAGNOSIS — I10 Essential (primary) hypertension: Secondary | ICD-10-CM | POA: Diagnosis not present

## 2023-11-16 DIAGNOSIS — I4891 Unspecified atrial fibrillation: Secondary | ICD-10-CM | POA: Diagnosis not present

## 2023-11-16 DIAGNOSIS — I2510 Atherosclerotic heart disease of native coronary artery without angina pectoris: Secondary | ICD-10-CM | POA: Diagnosis not present

## 2023-11-23 DIAGNOSIS — E559 Vitamin D deficiency, unspecified: Secondary | ICD-10-CM | POA: Diagnosis not present

## 2023-11-23 DIAGNOSIS — I4891 Unspecified atrial fibrillation: Secondary | ICD-10-CM | POA: Diagnosis not present

## 2023-11-23 DIAGNOSIS — E7800 Pure hypercholesterolemia, unspecified: Secondary | ICD-10-CM | POA: Diagnosis not present

## 2023-11-23 DIAGNOSIS — Z8673 Personal history of transient ischemic attack (TIA), and cerebral infarction without residual deficits: Secondary | ICD-10-CM | POA: Diagnosis not present

## 2023-11-23 DIAGNOSIS — N400 Enlarged prostate without lower urinary tract symptoms: Secondary | ICD-10-CM | POA: Diagnosis not present

## 2023-11-23 DIAGNOSIS — Z0000 Encounter for general adult medical examination without abnormal findings: Secondary | ICD-10-CM | POA: Diagnosis not present

## 2023-11-23 DIAGNOSIS — I10 Essential (primary) hypertension: Secondary | ICD-10-CM | POA: Diagnosis not present

## 2023-11-23 NOTE — Progress Notes (Unsigned)
 Electrophysiology Office Note:   Date:  11/24/2023  ID:  Michael Lambert, DOB February 19, 1956, MRN [id number]  Primary Cardiologist: Jerel Balding, MD Primary Heart Failure: None Electrophysiologist: OLE ONEIDA HOLTS, MD      History of Present Illness:   Michael Lambert is a 68 y.o. male, with h/o AF, PAC's/PVC's, palpitations, HTN, CAD, HLD, TIA seen today for routine electrophysiology followup.   Since last being seen in our clinic the patient reports he has been doing largely well. Has had back issues as of late and has not been riding as much ([employer]). He is post AF ablation in 2022 and to his knowledge has not had any recurrent AF.  He has not had any issues on Eliquis  but would like to be able to take NSAIDS for ortho issues.       He denies chest pain, palpitations, dyspnea, PND, orthopnea, nausea, vomiting, dizziness, syncope, edema, weight gain, or early satiety.   Review of systems complete and found to be negative unless listed in HPI.   EP Information / Studies Reviewed:    EKG is ordered today. Personal review as below.  EKG Interpretation Date/Time:  Tuesday November 24 2023 11:41:50 EDT Ventricular Rate:  75 PR Interval:  156 QRS Duration:  72 QT Interval:  378 QTC Calculation: 422 R Axis:   34  Text Interpretation: Normal sinus rhythm Normal ECG Confirmed by Aniceto Jarvis ([id number]) on 11/24/2023 11:43:39 AM   Studies:  EPS 07/03/20 > SR on presentation, successful isolation of PV's with RF current ETT 08/25/23 > normal results / no evidence of coronary blockages ECHO 10/13/23 > LVEF 55-60%, no RWMA   Arrhythmia / AAD AF    Risk Assessment/Calculations:    CHA2DS2-VASc Score = 5   This indicates a 7.2% annual risk of stroke. The patient's score is based upon: CHF History: 0 HTN History: 1 Diabetes History: 0 Stroke History: 2 Vascular Disease History: 1 Age Score: 1 Gender Score: 0             Physical Exam:   VS:  BP 134/72   Pulse 75   Ht 5' 9 (1.753 m)    Wt 171 lb 3.2 oz (77.7 kg)   SpO2 97%   BMI 25.28 kg/m    Wt Readings from Last 3 Encounters:  11/24/23 171 lb 3.2 oz (77.7 kg)  05/19/23 170 lb (77.1 kg)  02/02/23 167 lb 12.8 oz (76.1 kg)     GEN: Well nourished, well developed in no acute distress NECK: No JVD; No carotid bruits CARDIAC: Regular rate and rhythm, no murmurs, rubs, gallops RESPIRATORY:  Clear to auscultation without rales, wheezing or rhonchi  ABDOMEN: Soft, non-tender, non-distended EXTREMITIES:  No edema; No deformity   ASSESSMENT AND PLAN:    Paroxysmal Atrial Fibrillation  CHA2DS2-VASc 5, s/p ablation 06/2020  -hx of arthritis and desired use of NSAIDS, interest in avoiding long term exposure to [facility] -planned for Watchman / LAAO on 12/03/23  -OAC for stroke prophylaxis  -pre-procedure labs > BMP, CBC   -procedure reviewed with patient, letter / instructions & soap given to patient    Secondary Hypercoagulable State  -continue Eliquis  5mg  BID, dose reviewed and appropriate by age / wt  CAD  -no anginal symptoms   Hypertension  -elevated in clinic > repeat wnl  -reports he just enrolled in a monitoring program with Dr. Lunette, his PCP > BP typically runs 130-140 systolic at home   Hx TIA  -no residual deficits  Hx Urinary Retention Post Anesthesia  -patient has had hx of retention post anesthesia, to the point he required I/O cath  -will monitor in post procedure period    Follow up with Dr. Cindie as planned for LAAO 12/03/23   Signed, Daphne Barrack, NP-C, AGACNP-BC [facility] - Electrophysiology  11/24/2023, 1:41 PM

## 2023-11-24 ENCOUNTER — Encounter: Payer: Self-pay | Admitting: Pulmonary Disease

## 2023-11-24 ENCOUNTER — Ambulatory Visit: Attending: Pulmonary Disease | Admitting: Pulmonary Disease

## 2023-11-24 VITALS — BP 134/72 | HR 75 | Ht 69.0 in | Wt 171.2 lb

## 2023-11-24 DIAGNOSIS — I10 Essential (primary) hypertension: Secondary | ICD-10-CM

## 2023-11-24 DIAGNOSIS — Z8673 Personal history of transient ischemic attack (TIA), and cerebral infarction without residual deficits: Secondary | ICD-10-CM

## 2023-11-24 DIAGNOSIS — I480 Paroxysmal atrial fibrillation: Secondary | ICD-10-CM

## 2023-11-24 DIAGNOSIS — I2510 Atherosclerotic heart disease of native coronary artery without angina pectoris: Secondary | ICD-10-CM

## 2023-11-24 DIAGNOSIS — D6869 Other thrombophilia: Secondary | ICD-10-CM

## 2023-11-24 NOTE — Patient Instructions (Signed)
 Medication Instructions:  Your physician recommends that you continue on your current medications as directed. Please refer to the Current Medication list given to you today.  *If you need a refill on your cardiac medications before your next appointment, please call your pharmacy*  Lab Work: BMET, CBC-TODAY If you have labs (blood work) drawn today and your tests are completely normal, you will receive your results only by: MyChart Message (if you have MyChart) OR A paper copy in the mail If you have any lab test that is abnormal or we need to change your treatment, we will call you to review the results.  Testing/Procedures: See letter  Follow-Up: At [facility], you and your health needs are our priority.  As part of our continuing mission to provide you with exceptional heart care, our providers are all part of one team.  This team includes your primary Cardiologist (physician) and Advanced Practice Providers or APPs (Physician Assistants and Nurse Practitioners) who all work together to provide you with the care you need, when you need it.  Your next appointment:   As scheduled

## 2023-11-25 ENCOUNTER — Ambulatory Visit: Payer: Self-pay | Admitting: Pulmonary Disease

## 2023-11-25 ENCOUNTER — Other Ambulatory Visit: Payer: Self-pay | Admitting: Sports Medicine

## 2023-11-25 DIAGNOSIS — M533 Sacrococcygeal disorders, not elsewhere classified: Secondary | ICD-10-CM

## 2023-11-25 DIAGNOSIS — M545 Low back pain, unspecified: Secondary | ICD-10-CM

## 2023-11-25 LAB — BASIC METABOLIC PANEL WITH GFR
BUN/Creatinine Ratio: 14 (ref 10–24)
BUN: 12 mg/dL (ref 8–27)
CO2: 22 mmol/L (ref 20–29)
Calcium: 10.1 mg/dL (ref 8.6–10.2)
Chloride: 100 mmol/L (ref 96–106)
Creatinine, Ser: 0.83 mg/dL (ref 0.76–1.27)
Glucose: 88 mg/dL (ref 70–99)
Potassium: 4.3 mmol/L (ref 3.5–5.2)
Sodium: 139 mmol/L (ref 134–144)
eGFR: 95 mL/min/1.73 (ref >59)

## 2023-11-25 LAB — CBC
Hematocrit: 47.8 % (ref 37.5–51.0)
Hemoglobin: 15.6 g/dL (ref 13.0–17.7)
MCH: 32.6 pg (ref 26.6–33.0)
MCHC: 32.6 g/dL (ref 31.5–35.7)
MCV: 100 fL — ABNORMAL HIGH (ref 79–97)
Platelets: 176 x10E3/uL (ref 150–450)
RBC: 4.79 x10E6/uL (ref 4.14–5.80)
RDW: 13 % (ref 11.6–15.4)
WBC: 7.7 x10E3/uL (ref 3.4–10.8)

## 2023-11-26 ENCOUNTER — Telehealth: Payer: Self-pay | Admitting: Physician Assistant

## 2023-11-26 NOTE — Telephone Encounter (Signed)
  [facility]   MULTIDISCIPLINARY HEART TEAM   Pt is scheduled for 12/03/23 to get a LAAO with Dr. Cindie. He called into the office to let us  know that he would like to cancel his procedure and does not want to be rescheduled at this time.   Lamarr Hummer PA-C

## 2023-12-02 NOTE — Therapy (Signed)
 OUTPATIENT PHYSICAL THERAPY THORACOLUMBAR EVALUATION   Patient Name: Michael Lambert MRN: [id number] DOB:Apr 22, 1956, 68 y.o., male Today's Date: 12/04/2023  END OF SESSION:  PT End of Session - 12/04/23 0901     Visit Number 1    Number of Visits 8    Date for PT Re-Evaluation 01/29/24    PT Start Time 1146    PT Stop Time 1228    PT Time Calculation (min) 42 min    Activity Tolerance Patient tolerated treatment well    Behavior During Therapy [facility] for tasks assessed/performed          Past Medical History:  Diagnosis Date   Anxiety    sees Dr. Slater Mam    Atrial fibrillation [facility])    on Xarelto  daily   Benign prostatic hypertrophy    GERD (gastroesophageal reflux disease)    Hyperlipidemia    Migraine    Stroke (HCC)    TIA memorial day 2018   Past Surgical History:  Procedure Laterality Date   APPENDECTOMY     ATRIAL FIBRILLATION ABLATION N/A 07/03/2020   Procedure: ATRIAL FIBRILLATION ABLATION;  Surgeon: Kelsie Agent, MD;  [facility]: [facility] INV[facility] LAB;  Service: Cardiovascular;  Laterality: N/A;   colonoscopy  10/08/2012   per Dr. Debrah, adenomatous polyps, repeat in 5 yrs    COLONOSCOPY     CYSTOSCOPY     per Dr. Nicholaus-    gum graft  08/2009   Dr Agent Bertin    HERNIA REPAIR  2008   bilateral inguinal, per Dr. Lily   implantable loop recorder removal  04/17/2021   MDT Reveal LINQ removed   LOOP RECORDER INSERTION N/A 11/18/2016   Procedure: Loop Recorder Insertion;  Surgeon: Kelsie Agent, MD;  Location: [facility] INVAS[facility] CV LAB;  Service: Cardiovascular;  Laterality: N/A;   PALATE / UVULA BIOPSY / EXCISION  2004   per Dr. Ethyl, for snoring    RADIAL KERATOTOMY     sees Dr. Ismael Boll as FU  at [facility] , bilateral,  dr stonecipher did the RK    repair detached retina     TEE WITHOUT CARDIOVERSION N/A 11/18/2016   Procedure: TRANSESOPHAGEAL ECHOCARDIOGRAM (TEE);  Surgeon: Alveta Aleene PARAS, MD;  Location: [facility] ENDOSCOPY;  Service: Cardiovascular;   Laterality: N/A;   TONSILLECTOMY     Patient Active Problem List   Diagnosis Date Noted   Primary osteoarthritis of right knee 05/02/2021   Shoulder impingement syndrome, left 03/07/2021   Secondary hypercoagulable state (HCC) 07/31/2020   Coronary artery disease involving native coronary artery of native heart without angina pectoris 04/24/2019   Essential hypertension 04/24/2019   Exertional chest pain 12/01/2018   MRI of brain abnormal 05/14/2017   Pituitary cyst (HCC) 05/14/2017   Paroxysmal atrial fibrillation (HCC) 03/19/2017   Long term current use of anticoagulant 03/19/2017   PVC's (premature ventricular contractions) 03/19/2017   History of TIA (transient ischemic attack)    Migraine with aura 11/04/2016   Premature atrial contractions 05/29/2016   Left atrial dilatation 05/29/2016   Diarrhea 04/23/2016   Palpitations 01/28/2016   Anxiety 10/22/2011   URI (upper respiratory infection) 08/05/2010   HEMATOMA 02/26/2010   Hyperlipidemia 01/11/2007   GERD 01/11/2007   BENIGN PROSTATIC HYPERTROPHY 01/11/2007   HEADACHE 01/11/2007    PCP: Ransom Other, MD PCP - General  REFERRING PROVIDER: Burnetta Brunet, DO Ref Provider  REFERRING DIAG: Diagnosis M53.3 (ICD-10-CM) - Sacroiliac joint dysfunction of left side M54.50,G89.29 (ICD-10-CM) - Chronic left-sided low  back pain without sciatica  Rationale for Evaluation and Treatment: Rehabilitation  THERAPY DIAG:  Muscle weakness (generalized)  Other low back pain  Abnormal posture  ONSET DATE: May 2025  SUBJECTIVE:                                                                                                                                                                                           SUBJECTIVE STATEMENT: Pt reports increased  L LBP that started in May.  Pain is mild on/off but increased in May, possibly after moving a heavy bike.  Pt denies radiating pain into the L LE.  PERTINENT HISTORY:   N/A  PAIN:  Are you having pain? Yes: NPRS scale: now 2/10 was 10/10 in May Pain location: L lumbar/SI jt Pain description: dull, deep Aggravating factors: transfers, bending over and coming back up Relieving factors: rest, ice  PRECAUTIONS: None  RED FLAGS: None   WEIGHT BEARING RESTRICTIONS: No  FALLS:  Has patient fallen in last 6 months? No  LIVING ENVIRONMENT: Lives with: lives with their spouse Lives in: House/apartment Has following equipment at home: None  OCCUPATION: Retired; does mountain biking and swimming  PLOF: Independent  PATIENT GOALS: Back to sports pain free  NEXT MD VISIT: Next appt 12/31/23  OBJECTIVE:  Note: Objective measures were completed at Evaluation unless otherwise noted.  DIAGNOSTIC FINDINGS:   MRI IMPRESSION: 11/05/23 1. Mild lumbar spondylosis with disc bulging and facet hypertrophy as described. No significant spinal stenosis or foraminal narrowing. 2. No acute findings or clear explanation for the patient's symptoms. PATIENT SURVEYS:  PSFS: THE PATIENT SPECIFIC FUNCTIONAL SCALE  Place score of 0-10 (0 = unable to perform activity and 10 = able to perform activity at the same level as before injury or problem)  Activity Date: 12/03/23    Mt biking 1    2.swimming 8    3.     4.      Total Score 3      Total Score = Sum of activity scores/number of activities  Minimally Detectable Change: 3 points (for single activity); 2 points (for average score)  [facility] (nd). The Patient Specific Functional Scale . Retrieved from [email or web address]   COGNITION: Overall cognitive status: Within functional limits for tasks assessed     SENSATION: WFL  MUSCLE LENGTH: Hamstrings: Right 50% deg; Left 50% deg Debby test: Right 50% deg; Left 50% deg  POSTURE: decreased lumbar lordosis, L ant innominate  PALPATION: 1+ tenderness at L SI jt  LUMBAR ROM:   AROM  Eval 12/03/23  Flexion 75%  Extension WNL  Right lateral flexion  WNL  Left lateral flexion 75%  Right rotation WNL  Left rotation WNL   (Blank rows = not tested)  LOWER EXTREMITY MMT:    MMT Right eval Left eval  Hip flexion 5 5-  Hip extension 5- 5-  Hip abduction 5- 5-  Hip adduction 5 5  Hip internal rotation    Hip external rotation    Knee flexion    Knee extension 5- 5-  Ankle dorsiflexion    Ankle plantarflexion    Ankle inversion    Ankle eversion     (Blank rows = not tested)  LUMBAR SPECIAL TESTS:  Straight leg raise test: Negative  FUNCTIONAL TESTS:   GAIT: Assistive device utilized: None Level of assistance: Complete Independence    TREATMENT DATE:  12/01/23 Eval, Full HEP reviewed and hamstring doorway stretch added, followed by [facility] contract/relax for hamstrings and MET for L ant innominate                                                                                                                                  PATIENT EDUCATION:  Education details: HEP reviewed Person educated: Patient Education method: Explanation, Demonstration, Tactile cues, and Verbal cues Education comprehension: verbalized understanding and returned demonstration  HOME EXERCISE PROGRAM: Access Code: [email or web address] URL: [email or web address] Date: 12/03/2023 Prepared by: Burnard Meth  Exercises - Supine Hamstring Stretch with Doorway  - 2 x daily - 7 x weekly - 1 sets - 3 reps - 25 hold   ASSESSMENT:  CLINICAL IMPRESSION: Patient is a 68 y.o. male who was seen today for physical therapy evaluation and treatment for L LBP. He displays decreased ROM, strength, flexibility and pain which restricts all ADLs and recreational activities.  He would benefit from skilled physical therapy to address deficits and return to previous LOA.  OBJECTIVE IMPAIRMENTS: decreased activity tolerance, decreased coordination, decreased endurance, decreased mobility,  decreased ROM, decreased strength, hypomobility, impaired flexibility, improper body mechanics, and postural dysfunction.   ACTIVITY LIMITATIONS: bending and sports  PARTICIPATION LIMITATIONS: sports  PERSONAL FACTORS: Age are also affecting patient's functional outcome.   REHAB POTENTIAL: Excellent  CLINICAL DECISION MAKING: Stable/uncomplicated  EVALUATION COMPLEXITY: Low   GOALS: Goals reviewed with patient? Yes  SHORT TERM GOALS: Target date: 12/31/2023    Pt to be independent with HEP. Baseline: Goal status: INITIAL  LONG TERM GOALS: Target date: 01/28/2024    Pt to have increased strength to 5/5 through LE musculature. Baseline:  Goal status: INITIAL  2.  Pt to have increased LE flexibility by 25%. Baseline:  Goal status: INITIAL  3.  Decrease pain 2/10 max with reactional activities. Baseline:  Goal status: INITIAL   PLAN:  PT FREQUENCY: 1-2x/week  PT DURATION: 8 weeks  PLANNED INTERVENTIONS: 97164- PT Re-evaluation, 97110-Therapeutic exercises, 97530- Therapeutic activity, 97112- Neuromuscular re-education, 97535- Self Care, 02859- Manual therapy, G0283- Electrical stimulation (unattended), Balance training, Joint mobilization, Spinal mobilization, Cryotherapy, and Moist heat.  PLAN  FOR NEXT SESSION: Review HEP, start on Rec bike and add leg press as tolerated.   Burnard CHRISTELLA Meth, PT 12/04/2023, 9:03 AM

## 2023-12-03 ENCOUNTER — Ambulatory Visit

## 2023-12-03 ENCOUNTER — Other Ambulatory Visit: Payer: Self-pay

## 2023-12-03 ENCOUNTER — Inpatient Hospital Stay (HOSPITAL_COMMUNITY): Admit: 2023-12-03 | Admitting: Cardiology

## 2023-12-03 ENCOUNTER — Encounter (HOSPITAL_COMMUNITY): Payer: Self-pay

## 2023-12-03 DIAGNOSIS — R293 Abnormal posture: Secondary | ICD-10-CM

## 2023-12-03 DIAGNOSIS — M6281 Muscle weakness (generalized): Secondary | ICD-10-CM

## 2023-12-03 DIAGNOSIS — M5459 Other low back pain: Secondary | ICD-10-CM

## 2023-12-03 SURGERY — LEFT ATRIAL APPENDAGE OCCLUSION
Anesthesia: General

## 2023-12-04 ENCOUNTER — Other Ambulatory Visit: Payer: Self-pay

## 2023-12-04 DIAGNOSIS — I2510 Atherosclerotic heart disease of native coronary artery without angina pectoris: Secondary | ICD-10-CM | POA: Diagnosis not present

## 2023-12-04 DIAGNOSIS — I10 Essential (primary) hypertension: Secondary | ICD-10-CM | POA: Diagnosis not present

## 2023-12-04 DIAGNOSIS — I4891 Unspecified atrial fibrillation: Secondary | ICD-10-CM | POA: Diagnosis not present

## 2023-12-11 ENCOUNTER — Encounter: Payer: Self-pay | Admitting: Rehabilitative and Restorative Service Providers"

## 2023-12-11 ENCOUNTER — Ambulatory Visit: Admitting: Rehabilitative and Restorative Service Providers"

## 2023-12-11 DIAGNOSIS — M5459 Other low back pain: Secondary | ICD-10-CM | POA: Diagnosis not present

## 2023-12-11 DIAGNOSIS — M6281 Muscle weakness (generalized): Secondary | ICD-10-CM | POA: Diagnosis not present

## 2023-12-11 DIAGNOSIS — R293 Abnormal posture: Secondary | ICD-10-CM | POA: Diagnosis not present

## 2023-12-11 NOTE — Therapy (Signed)
 OUTPATIENT PHYSICAL THERAPY THORACOLUMBAR TREATMENT   Patient Name: NASHAWN HILLOCK MRN: [id number] DOB:07-Mar-1956, 68 y.o., male Today's Date: 12/11/2023  END OF SESSION:  PT End of Session - 12/11/23 1138     Visit Number 2    Number of Visits 8    Date for PT Re-Evaluation 01/29/24    PT Start Time 1130    PT Stop Time 1153    PT Time Calculation (min) 23 min    Activity Tolerance Patient tolerated treatment well    Behavior During Therapy [facility] for tasks assessed/performed           Past Medical History:  Diagnosis Date   Anxiety    sees Dr. Slater Mam    Atrial fibrillation [facility])    on Xarelto  daily   Benign prostatic hypertrophy    GERD (gastroesophageal reflux disease)    Hyperlipidemia    Migraine    Stroke (HCC)    TIA memorial day 2018   Past Surgical History:  Procedure Laterality Date   APPENDECTOMY     ATRIAL FIBRILLATION ABLATION N/A 07/03/2020   Procedure: ATRIAL FIBRILLATION ABLATION;  Surgeon: Kelsie Agent, MD;  [facility];  Service: Cardiovascular;  Laterality: N/A;   colonoscopy  10/08/2012   per Dr. Debrah, adenomatous polyps, repeat in 5 yrs    COLONOSCOPY     CYSTOSCOPY     per Dr. Nicholaus-    gum graft  08/2009   Dr Agent Bertin    HERNIA REPAIR  2008   bilateral inguinal, per Dr. Lily   implantable loop recorder removal  04/17/2021   MDT Reveal LINQ removed   LOOP RECORDER INSERTION N/A 11/18/2016   Procedure: Loop Recorder Insertion;  Surgeon: Kelsie Agent, MD;  [facility]: [facility];  Service: Cardiovascular;  Laterality: N/A;   PALATE / UVULA BIOPSY / EXCISION  2004   per Dr. Ethyl, for snoring    RADIAL KERATOTOMY     sees Dr. Ismael Boll as FU  at [facility] , bilateral,  dr stonecipher did the RK    repair detached retina     TEE WITHOUT CARDIOVERSION N/A 11/18/2016   Procedure: TRANSESOPHAGEAL ECHOCARDIOGRAM (TEE);  Surgeon: Alveta Aleene PARAS, MD;  Location: [facility] ENDOSCOPY;  Service:  Cardiovascular;  Laterality: N/A;   TONSILLECTOMY     Patient Active Problem List   Diagnosis Date Noted   Primary osteoarthritis of right knee 05/02/2021   Shoulder impingement syndrome, left 03/07/2021   Secondary hypercoagulable state (HCC) 07/31/2020   Coronary artery disease involving native coronary artery of native heart without angina pectoris 04/24/2019   Essential hypertension 04/24/2019   Exertional chest pain 12/01/2018   MRI of brain abnormal 05/14/2017   Pituitary cyst (HCC) 05/14/2017   Paroxysmal atrial fibrillation (HCC) 03/19/2017   Long term current use of anticoagulant 03/19/2017   PVC's (premature ventricular contractions) 03/19/2017   History of TIA (transient ischemic attack)    Migraine with aura 11/04/2016   Premature atrial contractions 05/29/2016   Left atrial dilatation 05/29/2016   Diarrhea 04/23/2016   Palpitations 01/28/2016   Anxiety 10/22/2011   URI (upper respiratory infection) 08/05/2010   HEMATOMA 02/26/2010   Hyperlipidemia 01/11/2007   GERD 01/11/2007   BENIGN PROSTATIC HYPERTROPHY 01/11/2007   HEADACHE 01/11/2007    PCP: Ransom Other, MD PCP - General  REFERRING PROVIDER: Burnetta Brunet, DO Ref Provider  REFERRING DIAG: Diagnosis M53.3 (ICD-10-CM) - Sacroiliac joint dysfunction of left side M54.50,G89.29 (ICD-10-CM) - Chronic left-sided  low back pain without sciatica  Rationale for Evaluation and Treatment: Rehabilitation  THERAPY DIAG:  Muscle weakness (generalized)  Other low back pain  Abnormal posture  ONSET DATE: May 2025  SUBJECTIVE:                                                                                                                                                                                           SUBJECTIVE STATEMENT: Tashi notes less back pain since visit 1.  He would like a weeded down HEP as he has multiple exercises from previous PT for his back (only 2 exercises with [facility]).  PERTINENT  HISTORY:  N/A  PAIN:  Are you having pain? Yes: NPRS scale: 0-1/10 (was 10/10 in May) Pain location: L lumbar/SI jt Pain description: dull, deep Aggravating factors: transfers, bending over and coming back up Relieving factors: rest, ice  PRECAUTIONS: None  RED FLAGS: None   WEIGHT BEARING RESTRICTIONS: No  FALLS:  Has patient fallen in last 6 months? No  LIVING ENVIRONMENT: Lives with: lives with their spouse Lives in: House/apartment Has following equipment at home: None  OCCUPATION: Retired; does mountain biking and swimming  PLOF: Independent  PATIENT GOALS: Back to sports pain free  NEXT MD VISIT: Next appt 12/31/23  OBJECTIVE:  Note: Objective measures were completed at Evaluation unless otherwise noted.  DIAGNOSTIC FINDINGS:   MRI IMPRESSION: 11/05/23 1. Mild lumbar spondylosis with disc bulging and facet hypertrophy as described. No significant spinal stenosis or foraminal narrowing. 2. No acute findings or clear explanation for the patient's symptoms. PATIENT SURVEYS:  PSFS: THE PATIENT SPECIFIC FUNCTIONAL SCALE  Place score of 0-10 (0 = unable to perform activity and 10 = able to perform activity at the same level as before injury or problem)  Activity Date: 12/03/23    Mt biking 1    2.swimming 8    3.     4.      Total Score 3      Total Score = Sum of activity scores/number of activities  Minimally Detectable Change: 3 points (for single activity); 2 points (for average score)  [facility] (nd). The Patient Specific Functional Scale . Retrieved from [email or web address]   COGNITION: Overall cognitive status: Within functional limits for tasks assessed     SENSATION: WFL  MUSCLE LENGTH: Hamstrings: Right 50% deg; Left 50% deg Debby test: Right 50% deg; Left 50% deg  POSTURE: decreased lumbar lordosis, L ant innominate  PALPATION: 1+ tenderness at L SI jt  LUMBAR ROM:    AROM Eval 12/03/23  Flexion 75%  Extension WNL  Right lateral flexion WNL  Left lateral flexion 75%  Right rotation WNL  Left rotation WNL   (Blank rows = not tested)  LOWER EXTREMITY MMT:    MMT Right eval Left eval  Hip flexion 5 5-  Hip extension 5- 5-  Hip abduction 5- 5-  Hip adduction 5 5  Hip internal rotation    Hip external rotation    Knee flexion    Knee extension 5- 5-  Ankle dorsiflexion    Ankle plantarflexion    Ankle inversion    Ankle eversion     (Blank rows = not tested)  LUMBAR SPECIAL TESTS:  Straight leg raise test: Negative  FUNCTIONAL TESTS:   GAIT: Assistive device utilized: None Level of assistance: Complete Independence    TREATMENT DATE:  12/11/2023 Contract-relax hamstrings stretch 4 x 20 seconds bilateral Prone alternating arm and leg extensions 2 sets of 10 for 3 seconds  02464: Reviewed spine anatomy with model; reviewed his imaging; practical lifting techniques for his bike and ADLs   12/01/23 Eval, Full HEP reviewed and hamstring doorway stretch added, followed by [facility] contract/relax for hamstrings and MET for L ant innominate                                                                                                                                  PATIENT EDUCATION:  Education details: HEP reviewed Person educated: Patient Education method: Explanation, Demonstration, Tactile cues, and Verbal cues Education comprehension: verbalized understanding and returned demonstration  HOME EXERCISE PROGRAM: Access Code: [email or web address] URL: [email or web address] Date: 12/11/2023 Prepared by: Lamar Ivory  Exercises - Supine Hamstring Stretch with Doorway  - 2 x daily - 7 x weekly - 1 sets - 3 reps - 25 hold - Prone Alternating Arm and Leg Lifts  - 1 x daily - 7 x weekly - 2 sets - 10 reps - 5-10 seconds hold    ASSESSMENT:  CLINICAL IMPRESSION: Rhodes's prone strength test was assessed at 34 seconds today  (30-60 seconds expected).  We spent quite a bit of time reviewing spine education basics for education to avoid future injury.  Added a lumbar strength exercise and discussed weeding down his multiple previous spine exercises (from previous PT episodes) to make his HEP compliance easier.  He is on track to meet long-term goals.  Patient is a 68 y.o. male who was seen today for physical therapy evaluation and treatment for L LBP. He displays decreased ROM, strength, flexibility and pain which restricts all ADLs and recreational activities.  He would benefit from skilled physical therapy to address deficits and return to previous LOA.  OBJECTIVE IMPAIRMENTS: decreased activity tolerance, decreased coordination, decreased endurance, decreased mobility, decreased ROM, decreased strength, hypomobility, impaired flexibility, improper body mechanics, and postural dysfunction.   ACTIVITY LIMITATIONS: bending and sports  PARTICIPATION LIMITATIONS: sports  PERSONAL FACTORS: Age are also affecting patient's functional outcome.   REHAB POTENTIAL: Excellent  CLINICAL DECISION MAKING: Stable/uncomplicated  EVALUATION COMPLEXITY:  Low   GOALS: Goals reviewed with patient? Yes  SHORT TERM GOALS: Target date: 12/31/2023    Pt to be independent with HEP. Baseline: Goal status: Partially Met 12/11/2023  LONG TERM GOALS: Target date: 01/28/2024    Pt to have increased strength to 5/5 through LE musculature. Baseline:  Goal status: INITIAL  2.  Pt to have increased LE flexibility by 25%. Baseline:  Goal status: INITIAL  3.  Decrease pain 2/10 max with reactional activities. Baseline:  Goal status: INITIAL   PLAN:  PT FREQUENCY: 1-2x/week  PT DURATION: 8 weeks  PLANNED INTERVENTIONS: 97164- PT Re-evaluation, 97110-Therapeutic exercises, 97530- Therapeutic activity, 97112- Neuromuscular re-education, 97535- Self Care, 02859- Manual therapy, G0283- Electrical stimulation (unattended), Balance  training, Joint mobilization, Spinal mobilization, Cryotherapy, and Moist heat.  PLAN FOR NEXT SESSION: Review HEP, Maybe add a hip abductors strength activity if needed?  Review walking program importance.  From eval: start on Rec bike and add leg press as tolerated.   Myer LELON Ivory, PT, MPT 12/11/2023, 4:23 PM

## 2023-12-14 ENCOUNTER — Encounter: Payer: Self-pay | Admitting: Cardiovascular Disease

## 2023-12-14 DIAGNOSIS — H52203 Unspecified astigmatism, bilateral: Secondary | ICD-10-CM | POA: Diagnosis not present

## 2023-12-14 DIAGNOSIS — H2513 Age-related nuclear cataract, bilateral: Secondary | ICD-10-CM | POA: Diagnosis not present

## 2023-12-14 DIAGNOSIS — H401131 Primary open-angle glaucoma, bilateral, mild stage: Secondary | ICD-10-CM | POA: Diagnosis not present

## 2023-12-14 DIAGNOSIS — H25042 Posterior subcapsular polar age-related cataract, left eye: Secondary | ICD-10-CM | POA: Diagnosis not present

## 2023-12-17 DIAGNOSIS — E7800 Pure hypercholesterolemia, unspecified: Secondary | ICD-10-CM | POA: Diagnosis not present

## 2023-12-17 DIAGNOSIS — F411 Generalized anxiety disorder: Secondary | ICD-10-CM | POA: Diagnosis not present

## 2023-12-17 DIAGNOSIS — I2510 Atherosclerotic heart disease of native coronary artery without angina pectoris: Secondary | ICD-10-CM | POA: Diagnosis not present

## 2023-12-17 DIAGNOSIS — I10 Essential (primary) hypertension: Secondary | ICD-10-CM | POA: Diagnosis not present

## 2023-12-17 DIAGNOSIS — I4891 Unspecified atrial fibrillation: Secondary | ICD-10-CM | POA: Diagnosis not present

## 2023-12-18 ENCOUNTER — Ambulatory Visit

## 2023-12-18 DIAGNOSIS — R293 Abnormal posture: Secondary | ICD-10-CM | POA: Diagnosis not present

## 2023-12-18 DIAGNOSIS — M6281 Muscle weakness (generalized): Secondary | ICD-10-CM | POA: Diagnosis not present

## 2023-12-18 DIAGNOSIS — M5459 Other low back pain: Secondary | ICD-10-CM

## 2023-12-18 NOTE — Therapy (Signed)
 OUTPATIENT PHYSICAL THERAPY THORACOLUMBAR TREATMENT   Patient Name: Michael Lambert MRN: [id number] DOB:[birth year], 68 y.o., male Today's Date: 12/18/2023  END OF SESSION:  PT End of Session - 12/18/23 0935     Visit Number 3    Number of Visits 8    Date for PT Re-Evaluation 01/29/24    PT Start Time 0845    PT Stop Time 0923    PT Time Calculation (min) 38 min    Activity Tolerance Patient tolerated treatment well    Behavior During Therapy [facility] for tasks assessed/performed            Past Medical History:  Diagnosis Date   Anxiety    sees Michael. Slater Lambert    Atrial fibrillation [facility])    on Xarelto  daily   Benign prostatic hypertrophy    GERD (gastroesophageal reflux disease)    Hyperlipidemia    Migraine    Stroke (HCC)    TIA memorial day 2018   Past Surgical History:  Procedure Laterality Date   APPENDECTOMY     ATRIAL FIBRILLATION ABLATION N/A 07/03/2020   Procedure: ATRIAL FIBRILLATION ABLATION;  Surgeon: Michael Agent, MD;  [facility]: [facility] INVAS[facility] CV LAB;  Service: Cardiovascular;  Laterality: N/A;   colonoscopy  10/08/2012   per Michael. Debrah, adenomatous polyps, repeat in 5 yrs    COLONOSCOPY     CYSTOSCOPY     per Michael. Nicholaus-    gum graft  08/2009   Michael Lambert    HERNIA REPAIR  2008   bilateral inguinal, per Michael. Lily   implantable loop recorder removal  04/17/2021   MDT Reveal LINQ removed   LOOP RECORDER INSERTION N/A 11/18/2016   Procedure: Loop Recorder Insertion;  Surgeon: Michael Agent, MD;  Location: [facility] INVAS[facility] CV LAB;  Service: Cardiovascular;  Laterality: N/A;   PALATE / UVULA BIOPSY / EXCISION  2004   per Michael. Ethyl, for snoring    RADIAL KERATOTOMY     sees Michael. Ismael Lambert as FU  at [facility] , bilateral,  Michael Michael Lambert did the RK    repair detached retina     TEE WITHOUT CARDIOVERSION N/A 11/18/2016   Procedure: TRANSESOPHAGEAL ECHOCARDIOGRAM (TEE);  Surgeon: Michael Aleene PARAS, MD;  Location: [facility] ENDOSCOPY;  Service:  Cardiovascular;  Laterality: N/A;   TONSILLECTOMY     Patient Active Problem List   Diagnosis Date Noted   Primary osteoarthritis of right knee 05/02/2021   Shoulder impingement syndrome, left 03/07/2021   Secondary hypercoagulable state (HCC) 07/31/2020   Coronary artery disease involving native coronary artery of native heart without angina pectoris 04/24/2019   Essential hypertension 04/24/2019   Exertional chest pain 12/01/2018   MRI of brain abnormal 05/14/2017   Pituitary cyst (HCC) 05/14/2017   Paroxysmal atrial fibrillation (HCC) 03/19/2017   Long term current use of anticoagulant 03/19/2017   PVC's (premature ventricular contractions) 03/19/2017   History of TIA (transient ischemic attack)    Migraine with aura 11/04/2016   Premature atrial contractions 05/29/2016   Left atrial dilatation 05/29/2016   Diarrhea 04/23/2016   Palpitations 01/28/2016   Anxiety 10/22/2011   URI (upper respiratory infection) 08/05/2010   HEMATOMA 02/26/2010   Hyperlipidemia 01/11/2007   GERD 01/11/2007   BENIGN PROSTATIC HYPERTROPHY 01/11/2007   HEADACHE 01/11/2007    PCP: Michael Other, MD PCP - General  REFERRING PROVIDER: Burnetta Brunet, DO Ref Provider  REFERRING DIAG: Diagnosis M53.3 (ICD-10-CM) - Sacroiliac joint dysfunction of left side M54.50,G89.29 (ICD-10-CM) - Chronic  left-sided low back pain without sciatica  Rationale for Evaluation and Treatment: Rehabilitation  THERAPY DIAG:  Muscle weakness (generalized)  Lambert low back pain  Abnormal posture  ONSET DATE: May 2025  SUBJECTIVE:                                                                                                                                                                                           SUBJECTIVE STATEMENT: Michael Lambert states  its been pretty good in the back. Still has a few HEP questions.  PERTINENT HISTORY:  N/A  PAIN:  Are you having pain? Yes: NPRS scale: 0-1/10 (was 10/10 in  May) Pain location: L lumbar/SI jt Pain description: dull, deep Aggravating factors: transfers, bending over and coming back up Relieving factors: rest, ice  PRECAUTIONS: None  RED FLAGS: None   WEIGHT BEARING RESTRICTIONS: No  FALLS:  Has patient fallen in last 6 months? No  LIVING ENVIRONMENT: Lives with: lives with their spouse Lives in: House/apartment Has following equipment at home: None  OCCUPATION: Retired; does mountain biking and swimming  PLOF: Independent  PATIENT GOALS: Back to sports pain free  NEXT MD VISIT: Next appt 12/31/23  OBJECTIVE:  Note: Objective measures were completed at Evaluation unless otherwise noted.  DIAGNOSTIC FINDINGS:   MRI IMPRESSION: 11/05/23 1. Mild lumbar spondylosis with disc bulging and facet hypertrophy as described. No significant spinal stenosis or foraminal narrowing. 2. No acute findings or clear explanation for the patient's symptoms. PATIENT SURVEYS:  PSFS: THE PATIENT SPECIFIC FUNCTIONAL SCALE  Place score of 0-10 (0 = unable to perform activity and 10 = able to perform activity at the same level as before injury or problem)  Activity Date: 12/03/23    Mt biking 1    2.swimming 8    3.     4.      Total Score 3      Total Score = Sum of activity scores/number of activities  Minimally Detectable Change: 3 points (for single activity); 2 points (for average score)  [facility] (nd). The Patient Specific Functional Scale . Retrieved from [email or web address]   COGNITION: Overall cognitive status: Within functional limits for tasks assessed     SENSATION: WFL  MUSCLE LENGTH: Hamstrings: Right 50% deg; Left 50% deg Debby test: Right 50% deg; Left 50% deg  POSTURE: decreased lumbar lordosis, L ant innominate  PALPATION: 1+ tenderness at L SI jt  LUMBAR ROM:   AROM Eval 12/03/23  Flexion 75%  Extension WNL  Right lateral flexion WNL   Left lateral flexion 75%  Right rotation WNL  Left rotation WNL   (Blank  rows = not tested)  LOWER EXTREMITY MMT:    MMT Right eval Left eval  Hip flexion 5 5-  Hip extension 5- 5-  Hip abduction 5- 5-  Hip adduction 5 5  Hip internal rotation    Hip external rotation    Knee flexion    Knee extension 5- 5-  Ankle dorsiflexion    Ankle plantarflexion    Ankle inversion    Ankle eversion     (Blank rows = not tested)  LUMBAR SPECIAL TESTS:  Straight leg raise test: Negative  FUNCTIONAL TESTS:   GAIT: Assistive device utilized: None Level of assistance: Complete Independence    TREATMENT DATE:  12/18/23 Rec bike Level 3   9 min Prone opp arm opp leg 5 sec hold 10x2 Hamstring doorway stretch Squats with 6 lb ball  2x15 Calf incline board stretch  4x30 sec Supine bridges with Tband green 10x3 Supine tband abduction 3x10  Manual: Contract-relax hamstrings stretch 3 x 25 seconds bilateral      12/11/2023 Contract-relax hamstrings stretch 4 x 20 seconds bilateral Prone alternating arm and leg extensions 2 sets of 10 for 3 seconds  02464: Reviewed spine anatomy with model; reviewed his imaging; practical lifting techniques for his bike and ADLs   12/01/23 Eval, Full HEP reviewed and hamstring doorway stretch added, followed by [facility] contract/relax for hamstrings and MET for L ant innominate                                                                                                                                  PATIENT EDUCATION:  Education details: HEP reviewed Person educated: Patient Education method: Explanation, Demonstration, Tactile cues, and Verbal cues Education comprehension: verbalized understanding and returned demonstration  HOME EXERCISE PROGRAM: Access Code: [email or web address] URL: [email or web address] Date: 12/11/2023 Prepared by: Lamar Ivory  Exercises - Supine Hamstring Stretch with Doorway  - 2 x daily - 7 x weekly  - 1 sets - 3 reps - 25 hold - Prone Alternating Arm and Leg Lifts  - 1 x daily - 7 x weekly - 2 sets - 10 reps - 5-10 seconds hold    ASSESSMENT:  CLINICAL IMPRESSION: Pt needed VC for proper head/cervical positioning in prone.  Demonstrated understanding.   Patient is a 68 y.o. male who was seen today for physical therapy evaluation and treatment for L LBP. He displays decreased ROM, strength, flexibility and pain which restricts all ADLs and recreational activities.  He would benefit from skilled physical therapy to address deficits and return to previous LOA.  OBJECTIVE IMPAIRMENTS: decreased activity tolerance, decreased coordination, decreased endurance, decreased mobility, decreased ROM, decreased strength, hypomobility, impaired flexibility, improper body mechanics, and postural dysfunction.   ACTIVITY LIMITATIONS: bending and sports  PARTICIPATION LIMITATIONS: sports  PERSONAL FACTORS: Age are also affecting patient's functional outcome.   REHAB POTENTIAL: Excellent  CLINICAL DECISION MAKING: Stable/uncomplicated  EVALUATION COMPLEXITY: Low   GOALS: Goals reviewed with  patient? Yes  SHORT TERM GOALS: Target date: 12/31/2023    Pt to be independent with HEP. Baseline: Goal status: Partially Met 12/11/2023  LONG TERM GOALS: Target date: 01/28/2024    Pt to have increased strength to 5/5 through LE musculature. Baseline:  Goal status: INITIAL  2.  Pt to have increased LE flexibility by 25%. Baseline:  Goal status: INITIAL  3.  Decrease pain 2/10 max with reactional activities. Baseline:  Goal status: INITIAL   PLAN:  PT FREQUENCY: 1-2x/week  PT DURATION: 8 weeks  PLANNED INTERVENTIONS: 97164- PT Re-evaluation, 97110-Therapeutic exercises, 97530- Therapeutic activity, 97112- Neuromuscular re-education, 97535- Self Care, 02859- Manual therapy, G0283- Electrical stimulation (unattended), Balance training, Joint mobilization, Spinal mobilization, Cryotherapy,  and Moist heat.  PLAN FOR NEXT SESSION:Reviewed new HEP.     Burnard CHRISTELLA Meth, PT, MPT 12/18/2023, 9:37 AM

## 2023-12-23 ENCOUNTER — Encounter

## 2023-12-29 ENCOUNTER — Encounter: Payer: Self-pay | Admitting: Cardiovascular Disease

## 2023-12-29 ENCOUNTER — Encounter: Payer: Self-pay | Admitting: Cardiology

## 2023-12-31 ENCOUNTER — Ambulatory Visit (INDEPENDENT_AMBULATORY_CARE_PROVIDER_SITE_OTHER): Admitting: Orthopedic Surgery

## 2023-12-31 ENCOUNTER — Other Ambulatory Visit (INDEPENDENT_AMBULATORY_CARE_PROVIDER_SITE_OTHER): Payer: Self-pay

## 2023-12-31 DIAGNOSIS — M545 Low back pain, unspecified: Secondary | ICD-10-CM

## 2023-12-31 DIAGNOSIS — G8929 Other chronic pain: Secondary | ICD-10-CM | POA: Diagnosis not present

## 2023-12-31 NOTE — Progress Notes (Signed)
 Orthopedic Spine Surgery Office Note  Assessment: Patient is a 68 y.o. male with low back pain, suspect from possible facet arthropathy at L3/4 and L4/5   Plan: -Explained that initially conservative treatment is tried as a significant number of patients may experience relief with these treatment modalities. Discussed that the conservative treatments include:  -activity modification  -physical therapy  -over the counter pain medications  -medrol  dosepak  -lumbar steroid injections -Patient has noticed significant improvement, so do not recommend any further intervention at this time -If pain does return, would consider L3/4 and L4/5 facet injections for diagnostic and therapeutic purposes -Since he has noticed improvement with PT, he should continue with the home exercise program -Patient should return to office on an as-needed basis   Patient expressed understanding of the plan and all questions were answered to the patient's satisfaction.   ___________________________________________________________________________   History:  Patient is a 68 y.o. male who presents today for lumbar spine.  Patient has a history of chronic back pain but noticed acute worsening in May 2025.  He said at the time he was lifting awkwardly.  He has tried multiple treatments since that time.  He has noticed within the last month that his pain has gotten significantly better.  He said its back down to a 1 or 2 out of 10.  He feels the pain on the left side of his lower lumbar region.  He has been working with physical therapy and continues to do a home exercise program.  Does not have any pain radiating down either lower extremity.   Weakness: Denies Symptoms of imbalance: Denies Paresthesias and numbness: Denies Bowel or bladder incontinence: Denies Saddle anesthesia: Denies  Treatments tried: NKDA  Review of systems: Denies fevers and chills, night sweats, unexplained weight loss, history of cancer,  pain that wakes them at night  Past medical history: HTN HLD CAD Anxiety GERD TIA Atrial fibrillation BPH  Allergies: NKDA  Past surgical history:  Appendectomy Atrial fibrillation ablation Loop recorder insertion Hernia repair Tonsillectomy LASIK  Social history: Denies use of nicotine product (smoking, vaping, patches, smokeless) Alcohol use: Yes, approximately 5 drinks per week Denies recreational drug use   Physical Exam:  General: no acute distress, appears stated age Neurologic: alert, answering questions appropriately, following commands Respiratory: unlabored breathing on room air, symmetric chest rise Psychiatric: appropriate affect, normal cadence to speech   MSK (spine):  -Strength exam      Left  Right EHL    5/5  5/5 TA    5/5  5/5 GSC    5/5  5/5 Knee extension  5/5  5/5 Hip flexion   5/5  5/5  -Sensory exam    Sensation intact to light touch in L3-S1 nerve distributions of bilateral lower extremities  -Achilles DTR: 2/4 on the left, 2/4 on the right -Patellar tendon DTR: 2/4 on the left, 2/4 on the right  -Straight leg raise: Negative bilaterally -Clonus: no beats bilaterally  -Left hip exam: No pain through range of motion, negative Stinchfield, negative FABER, negative SI joint compression test, negative Gaenslen's -Right hip exam: No pain through range of motion  Imaging: XRs of the lumbar spine from 12/31/2023 were independently reviewed and interpreted, showing no significant degenerative changes.  No evidence of instability on flexion/extension views.  No fracture or dislocation seen.  MRI of the lumbar spine from 10/28/2023 was independently reviewed and interpreted, showing disc desiccation at L3/4.  Early facet arthropathy at L3/4 and L4/5.  No other  significant degenerative changes seen.  No significant central, lateral recess, or foraminal stenosis.   Patient name: Michael Lambert Patient MRN: [id number] Date of visit: 12/31/23

## 2024-01-02 NOTE — Therapy (Addendum)
 OUTPATIENT PHYSICAL THERAPY THORACOLUMBAR TREATMENT/DISCHARGE NOTE   Patient Name: Michael Lambert MRN: [id number] DOB:June 27, 1955, 68 y.o., male Today's Date: 01/04/2024  END OF SESSION:  PT End of Session - 01/04/24 0933     Visit Number 4    Number of Visits 8    Date for PT Re-Evaluation 01/29/24    PT Start Time 0802    PT Stop Time 0840    PT Time Calculation (min) 38 min    Activity Tolerance Patient tolerated treatment well    Behavior During Therapy WFL for tasks assessed/performed          PHYSICAL THERAPY DISCHARGE SUMMARY  Visits from Start of Care: 4  Current functional level related to goals / functional outcomes: See below   Remaining deficits: none   Education / Equipment: HEP   Patient agrees to discharge. Patient goals were met. Patient is being discharged due to meeting the stated rehab goals.    Past Medical History:  Diagnosis Date   Anxiety    sees Dr. Slater Mam    Atrial fibrillation [facility])    on Xarelto  daily   Benign prostatic hypertrophy    GERD (gastroesophageal reflux disease)    Hyperlipidemia    Migraine    Stroke (HCC)    TIA memorial day 2018   Past Surgical History:  Procedure Laterality Date   APPENDECTOMY     ATRIAL FIBRILLATION ABLATION N/A 07/03/2020   Procedure: ATRIAL FIBRILLATION ABLATION;  Surgeon: Kelsie Agent, MD;  Location: [facility] INVAS[facility] CV LAB;  Service: Cardiovascular;  Laterality: N/A;   colonoscopy  10/08/2012   per Dr. Debrah, adenomatous polyps, repeat in 5 yrs    COLONOSCOPY     CYSTOSCOPY     per Dr. Nicholaus-    gum graft  08/2009   Dr Agent Bertin    HERNIA REPAIR  2008   bilateral inguinal, per Dr. Lily   implantable loop recorder removal  04/17/2021   MDT Reveal LINQ removed   LOOP RECORDER INSERTION N/A 11/18/2016   Procedure: Loop Recorder Insertion;  Surgeon: Kelsie Agent, MD;  Location: [facility] INVAS[facility] CV LAB;  Service: Cardiovascular;  Laterality: N/A;   PALATE / UVULA BIOPSY / EXCISION   2004   per Dr. Ethyl, for snoring    RADIAL KERATOTOMY     sees Dr. Ismael Boll as FU  at [facility] , bilateral,  dr stonecipher did the RK    repair detached retina     TEE WITHOUT CARDIOVERSION N/A 11/18/2016   Procedure: TRANSESOPHAGEAL ECHOCARDIOGRAM (TEE);  Surgeon: Alveta Aleene PARAS, MD;  Location: [facility] ENDOSCOPY;  Service: Cardiovascular;  Laterality: N/A;   TONSILLECTOMY     Patient Active Problem List   Diagnosis Date Noted   Primary osteoarthritis of right knee 05/02/2021   Shoulder impingement syndrome, left 03/07/2021   Secondary hypercoagulable state (HCC) 07/31/2020   Coronary artery disease involving native coronary artery of native heart without angina pectoris 04/24/2019   Essential hypertension 04/24/2019   Exertional chest pain 12/01/2018   MRI of brain abnormal 05/14/2017   Pituitary cyst (HCC) 05/14/2017   Paroxysmal atrial fibrillation (HCC) 03/19/2017   Long term current use of anticoagulant 03/19/2017   PVC's (premature ventricular contractions) 03/19/2017   History of TIA (transient ischemic attack)    Migraine with aura 11/04/2016   Premature atrial contractions 05/29/2016   Left atrial dilatation 05/29/2016   Diarrhea 04/23/2016   Palpitations 01/28/2016   Anxiety 10/22/2011   URI (upper respiratory infection) 08/05/2010  HEMATOMA 02/26/2010   Hyperlipidemia 01/11/2007   GERD 01/11/2007   BENIGN PROSTATIC HYPERTROPHY 01/11/2007   HEADACHE 01/11/2007    PCP: Ransom Other, MD PCP - General  REFERRING PROVIDER: Burnetta Brunet, DO Ref Provider  REFERRING DIAG: Diagnosis M53.3 (ICD-10-CM) - Sacroiliac joint dysfunction of left side M54.50,G89.29 (ICD-10-CM) - Chronic left-sided low back pain without sciatica  Rationale for Evaluation and Treatment: Rehabilitation  THERAPY DIAG:  Muscle weakness (generalized)  Other low back pain  Abnormal posture  ONSET DATE: May 2025  SUBJECTIVE:                                                                                                                                                                                            SUBJECTIVE STATEMENT: Michael Lambert states the back is doing well and he is able to mountain bike again. Pain is now zero. PERTINENT HISTORY:  N/A  PAIN:  Are you having pain? Yes: NPRS scale: 0/10 (was 10/10 in May) Pain location: L lumbar/SI jt Pain description: dull, deep Aggravating factors: transfers, bending over and coming back up Relieving factors: rest, ice  PRECAUTIONS: None  RED FLAGS: None   WEIGHT BEARING RESTRICTIONS: No  FALLS:  Has patient fallen in last 6 months? No  LIVING ENVIRONMENT: Lives with: lives with their spouse Lives in: House/apartment Has following equipment at home: None  OCCUPATION: Retired; does mountain biking and swimming  PLOF: Independent  PATIENT GOALS: Back to sports pain free  NEXT MD VISIT: Next appt 12/31/23  OBJECTIVE:  Note: Objective measures were completed at Evaluation unless otherwise noted.  DIAGNOSTIC FINDINGS:   MRI IMPRESSION: 11/05/23 1. Mild lumbar spondylosis with disc bulging and facet hypertrophy as described. No significant spinal stenosis or foraminal narrowing. 2. No acute findings or clear explanation for the patient's symptoms. PATIENT SURVEYS:  PSFS: THE PATIENT SPECIFIC FUNCTIONAL SCALE  Place score of 0-10 (0 = unable to perform activity and 10 = able to perform activity at the same level as before injury or problem)  Activity Date: 12/03/23 Date 01/04/24   Mt biking 1 10   2.swimming 8 10   3.     4.      Total Score 3 10     Total Score = Sum of activity scores/number of activities  Minimally Detectable Change: 3 points (for single activity); 2 points (for average score)  [facility] (nd). The Patient Specific Functional Scale . Retrieved from [email or web address]   COGNITION: Overall cognitive status:  Within functional limits for tasks assessed     SENSATION: WFL  MUSCLE LENGTH: Hamstrings: Right 75% deg; Left 75% deg Debby  test: Right 75% deg; Left 75% deg  POSTURE: decreased lumbar lordosis, L ant innominate  PALPATION: 1+ tenderness at L SI jt  LUMBAR ROM:   AROM Eval 12/03/23 01/04/24  Flexion 75% 100%  Extension WNL   Right lateral flexion WNL   Left lateral flexion 75% 100%  Right rotation WNL   Left rotation WNL    (Blank rows = not tested)  LOWER EXTREMITY MMT:    MMT Right eval Left eval  Hip flexion 5 5  Hip extension 5 5  Hip abduction 5 5  Hip adduction 5 5  Hip internal rotation    Hip external rotation    Knee flexion    Knee extension 5 5  Ankle dorsiflexion    Ankle plantarflexion    Ankle inversion    Ankle eversion     (Blank rows = not tested)  LUMBAR SPECIAL TESTS:  Straight leg raise test: Negative  FUNCTIONAL TESTS:   GAIT: Assistive device utilized: None Level of assistance: Complete Independence    TREATMENT DATE:  01/04/24 9197-9159 Rec bike Level 3   9 min Prone opp arm opp leg 5 sec hold 10x2 Hamstring doorway stretch Squats with 6 lb ball  2x15 Calf incline board stretch  4x30 sec Supine bridges with Tband green 10x3 Supine tband abduction 3x10      12/18/23 Rec bike Level 3   9 min Prone opp arm opp leg 5 sec hold 10x2 Hamstring doorway stretch Squats with 6 lb ball  2x15 Calf incline board stretch  4x30 sec Supine bridges with Tband green 10x3 Supine tband abduction 3x10  Manual: Contract-relax hamstrings stretch 3 x 25 seconds bilateral      12/11/2023 Contract-relax hamstrings stretch 4 x 20 seconds bilateral Prone alternating arm and leg extensions 2 sets of 10 for 3 seconds  02464: Reviewed spine anatomy with model; reviewed his imaging; practical lifting techniques for his bike and ADLs   12/01/23 Eval, Full HEP reviewed and hamstring doorway stretch added, followed by [facility] contract/relax  for hamstrings and MET for L ant innominate                                                                                                                                  PATIENT EDUCATION:  Education details: HEP reviewed Person educated: Patient Education method: Explanation, Demonstration, Tactile cues, and Verbal cues Education comprehension: verbalized understanding and returned demonstration  HOME EXERCISE PROGRAM: Access Code: [email or web address] URL: [email or web address] Date: 12/11/2023 Prepared by: Lamar Ivory  Exercises - Supine Hamstring Stretch with Doorway  - 2 x daily - 7 x weekly - 1 sets - 3 reps - 25 hold - Prone Alternating Arm and Leg Lifts  - 1 x daily - 7 x weekly - 2 sets - 10 reps - 5-10 seconds hold    ASSESSMENT:  CLINICAL IMPRESSION: Pt has completed all LTG and reports zero pain with activities.  His HEP  was reviewed and adjusted and he is ready for discharge.  Patient is a 68 y.o. male who was seen today for physical therapy evaluation and treatment for L LBP. He displays decreased ROM, strength, flexibility and pain which restricts all ADLs and recreational activities.  He would benefit from skilled physical therapy to address deficits and return to previous LOA.  OBJECTIVE IMPAIRMENTS: decreased activity tolerance, decreased coordination, decreased endurance, decreased mobility, decreased ROM, decreased strength, hypomobility, impaired flexibility, improper body mechanics, and postural dysfunction.   ACTIVITY LIMITATIONS: bending and sports  PARTICIPATION LIMITATIONS: sports  PERSONAL FACTORS: Age are also affecting patient's functional outcome.   REHAB POTENTIAL: Excellent  CLINICAL DECISION MAKING: Stable/uncomplicated  EVALUATION COMPLEXITY: Low   GOALS: Goals reviewed with patient? Yes  SHORT TERM GOALS: Target date: 12/31/2023    Pt to be independent with HEP. Baseline: Goal status: MET  LONG TERM GOALS: Target date:  01/28/2024    Pt to have increased strength to 5/5 through LE musculature. Baseline:  Goal status: MET  2.  Pt to have increased LE flexibility by 25%. Baseline:  Goal status: MET  3.  Decrease pain 2/10 max with reactional activities. Baseline:  Goal status: MET  PLAN:  PT FREQUENCY: 1-2x/week  PT DURATION: 8 weeks  PLANNED INTERVENTIONS: 97164- PT Re-evaluation, 97110-Therapeutic exercises, 97530- Therapeutic activity, 97112- Neuromuscular re-education, 97535- Self Care, 02859- Manual therapy, G0283- Electrical stimulation (unattended), Balance training, Joint mobilization, Spinal mobilization, Cryotherapy, and Moist heat.  PLAN FOR NEXT SESSION: DC to HEP   Burnard CHRISTELLA Meth, PT, MPT 01/04/2024, 9:34 AM

## 2024-01-03 DIAGNOSIS — I10 Essential (primary) hypertension: Secondary | ICD-10-CM | POA: Diagnosis not present

## 2024-01-03 DIAGNOSIS — I2510 Atherosclerotic heart disease of native coronary artery without angina pectoris: Secondary | ICD-10-CM | POA: Diagnosis not present

## 2024-01-03 DIAGNOSIS — I4891 Unspecified atrial fibrillation: Secondary | ICD-10-CM | POA: Diagnosis not present

## 2024-01-04 ENCOUNTER — Ambulatory Visit

## 2024-01-04 DIAGNOSIS — M6281 Muscle weakness (generalized): Secondary | ICD-10-CM

## 2024-01-04 DIAGNOSIS — M5459 Other low back pain: Secondary | ICD-10-CM | POA: Diagnosis not present

## 2024-01-04 DIAGNOSIS — R293 Abnormal posture: Secondary | ICD-10-CM

## 2024-01-08 ENCOUNTER — Encounter: Admitting: Sports Medicine

## 2024-01-11 DIAGNOSIS — K080 Exfoliation of teeth due to systemic causes: Secondary | ICD-10-CM | POA: Diagnosis not present

## 2024-01-17 DIAGNOSIS — F411 Generalized anxiety disorder: Secondary | ICD-10-CM | POA: Diagnosis not present

## 2024-01-17 DIAGNOSIS — I10 Essential (primary) hypertension: Secondary | ICD-10-CM | POA: Diagnosis not present

## 2024-01-17 DIAGNOSIS — I4891 Unspecified atrial fibrillation: Secondary | ICD-10-CM | POA: Diagnosis not present

## 2024-01-17 DIAGNOSIS — E7800 Pure hypercholesterolemia, unspecified: Secondary | ICD-10-CM | POA: Diagnosis not present

## 2024-01-17 DIAGNOSIS — I2510 Atherosclerotic heart disease of native coronary artery without angina pectoris: Secondary | ICD-10-CM | POA: Diagnosis not present

## 2024-01-19 ENCOUNTER — Ambulatory Visit: Admitting: Student

## 2024-01-27 ENCOUNTER — Encounter: Payer: Self-pay | Admitting: Cardiovascular Disease

## 2024-01-28 DIAGNOSIS — M25511 Pain in right shoulder: Secondary | ICD-10-CM | POA: Diagnosis not present

## 2024-01-29 ENCOUNTER — Encounter: Payer: Self-pay | Admitting: Sports Medicine

## 2024-02-01 NOTE — Progress Notes (Unsigned)
 Cardiology Office Note:  .   Date:  02/02/2024  ID:  Michael Lambert, DOB 12/29/1955, MRN [id number] PCP: Ransom Other, MD  [facility] Providers Cardiologist:  Jerel Balding, MD Electrophysiologist:  OLE ONEIDA HOLTS, MD    History of Present Illness: .   Michael Lambert is a 68 y.o. male with paroxysmal atrial fibrillation and history of TIA, moderate CAD by coronary CT angiography.  He has had a remarkably successful reduction in atrial fibrillation burden following ablation in February 2022.  No atrial fibrillation has been detected on his loop recorder since March 2022 (Device reached RRT in May 2022).  Patient was scheduled for Watchman in July but cancelled the procedure.  He's here for yearly f/u. Many questions about prior Coronary CT. Rides mountain bikes and tires quicker than in the past. Also swims some. Denies chest pain or shortness of breath. Brother has had 2 pulmonary embolism-first one unprovoked, second one after stopping coumadin for surgery. He will check with him to see if he has any genetic blood disorder that he will need a work up for before considering watchman further.     ROS:    Studies Reviewed: SABRA         Prior CV Studies:    Coronary CT angiogram 12/15/2018 showed a calcium  score at the 56th percentile had no significant coronary stenoses. There was a moderate lesion right coronary artery ostium (50-69%. FFR analysis showed a slow diffuse tapering of the LAD artery with the FFR in the distal vessel of 0.78 (2-2.5 mm vessel at that point). The ostial RCA was not hemodynamically significant (FFR 0.95).  Risk Assessment/Calculations:     CHA2DS2-VASc Score = 5  The patient's score is based upon: CHF History: 0 HTN History: 1 Diabetes History: 0 Stroke History: 2 Vascular Disease History: 1 Age Score: 1 Gender Score: 0       ASSESSMENT AND PLAN: Paroxysmal Atrial Fibrillation (ICD10:  I48.0) The patient's CHA2DS2-VASc score is 5,  indicating a 7.2% annual risk of stroke.     Secondary Hypercoagulable State (ICD10:  D68.69) The patient is at significant risk for stroke/thromboembolism based upon his CHA2DS2-VASc Score of 5.  Continue Apixaban  (Eliquis ).    Signed,  [name], PA-C    02/02/2024 9:38 AM            Physical Exam:   VS:  BP 115/68   Pulse 63   Ht 5' 9 (1.753 m)   Wt 174 lb 9.6 oz (79.2 kg)   SpO2 97%   BMI 25.78 kg/m    Orhtostatics: No data found. Wt Readings from Last 3 Encounters:  02/02/24 174 lb 9.6 oz (79.2 kg)  11/24/23 171 lb 3.2 oz (77.7 kg)  05/19/23 170 lb (77.1 kg)    GEN: Thin, in no acute distress NECK: No JVD; No carotid bruits CARDIAC:  RRR, no murmurs, rubs, gallops RESPIRATORY:  Clear to auscultation without rales, wheezing or rhonchi  ABDOMEN: Soft, non-tender, non-distended EXTREMITIES:  No edema; No deformity   ASSESSMENT AND PLAN: .    PAFib: He has had an excellent clinical response to ablation. CHADSVasc 4-5 (age, TIA , CAD, questionable hypertension).  -tolerating eliquis  without bleeding problems, also on toprol .  -brother with 2 PE's one unprovoked and the other provoked-see above. To check with him to see if he had hematology w/u and any genetic clotting disorders before considering Watchman at this point.   CAD: He does not have angina pectoris despite being  very physically active but does tire more quickly.  The focus is on risk factor modification.  Reviewed CTA from 2020 in detail with patient. He'd like to proceed with repeat CTA.  HTN: Well-controlled on a low-dose of metoprolol .     HLP: LDL 75 11/2023, close to goal of <70, on combination rosuvastatin  and Zetia .             Dispo: f/u in 1 yr Dr. [name]  Signed, [name], PA-C

## 2024-02-02 ENCOUNTER — Encounter: Payer: Self-pay | Admitting: Physician Assistant

## 2024-02-02 ENCOUNTER — Ambulatory Visit: Attending: Physician Assistant | Admitting: Physician Assistant

## 2024-02-02 VITALS — BP 115/68 | HR 63 | Ht 69.0 in | Wt 174.6 lb

## 2024-02-02 DIAGNOSIS — I10 Essential (primary) hypertension: Secondary | ICD-10-CM

## 2024-02-02 DIAGNOSIS — I2510 Atherosclerotic heart disease of native coronary artery without angina pectoris: Secondary | ICD-10-CM | POA: Diagnosis not present

## 2024-02-02 DIAGNOSIS — I480 Paroxysmal atrial fibrillation: Secondary | ICD-10-CM | POA: Diagnosis not present

## 2024-02-02 DIAGNOSIS — E782 Mixed hyperlipidemia: Secondary | ICD-10-CM

## 2024-02-02 DIAGNOSIS — I2583 Coronary atherosclerosis due to lipid rich plaque: Secondary | ICD-10-CM

## 2024-02-02 DIAGNOSIS — I4891 Unspecified atrial fibrillation: Secondary | ICD-10-CM | POA: Diagnosis not present

## 2024-02-02 NOTE — Patient Instructions (Signed)
 Thank you for choosing [facility]!     Medication Instructions:  On the morning of the Coronary CTA, take the Metoprolol  Succinate. Take 2 tablets 2 hours before the procedure.    *If you need a refill on your cardiac medications before your next appointment, please call your pharmacy*   Lab Work: Labs will be drawn today.................... BMET If you have labs (blood work) drawn today and your tests are completely normal, you will receive your results only by: MyChart Message (if you have MyChart) OR A paper copy in the mail If you have any lab test that is abnormal or we need to change your treatment, we will call you to review the results.   Testing/Procedures:   Your cardiac CT will be scheduled at one of the below locations:   [facility] [address] [place], [place] [address] [phone] (Severe contrast allergies only)  OR   [facility] [address] [place], [place] [address] [phone]  OR   [facility] [address] [place], [place] [address] [phone]  OR   [facility] V[facility] Tower [address]  [place], [place] [address] [phone]  OR   [facility] [address] [place], [place] [phone]  If scheduled at [facility], please arrive at the [facility] and Children's Entrance (Entrance C2) of [facility] 30 minutes prior to test start time. You can use the FREE valet parking offered at entrance C (encouraged to control the heart rate for the test)  Proceed to the [facility] Radiology Department (first floor) to check-in and test prep.  All radiology patients and guests should use entrance C2 at [facility], accessed from [facility], even though the hospital's physical address listed is [address]  If scheduled at the Heart and Vascular Tower at [address], please enter the parking lot  using the [address] entrance and use the FREE valet service at the patient drop-off area. Enter the building and check-in with registration on the main floor.  If scheduled at [facility], please arrive to the Heart and Vascular Center 15 mins early for check-in and test prep.  There is spacious parking and easy access to the radiology department from the [facility] Heart and Vascular entrance. Please enter here and check-in with the desk attendant.   If scheduled at [facility], please arrive 30 minutes early for check-in and test prep.  Please follow these instructions carefully (unless otherwise directed):  An IV will be required for this test and Nitroglycerin  will be given.  Hold all erectile dysfunction medications at least 3 days (72 hrs) prior to test. (Ie viagra, cialis, sildenafil, tadalafil, etc)   On the Night Before the Test: Be sure to Drink plenty of water. Do not consume any caffeinated/decaffeinated beverages or chocolate 12 hours prior to your test. Do not take any antihistamines 12 hours prior to your test.  If the patient has contrast allergy: Patient will need a prescription for Prednisone  and very clear instructions (as follows): Prednisone  50 mg - take 13 hours prior to test Take another Prednisone  50 mg 7 hours prior to test Take another Prednisone  50 mg 1 hour prior to test Take Benadryl 50 mg 1 hour prior to test Patient must complete all four doses of above prophylactic medications. Patient will need a ride after test due to Benadryl.  On the Day of  the Test: Drink plenty of water until 1 hour prior to the test. Do not eat any food 1 hour prior to test. You may take your regular medications prior to the test.  Take metoprolol  (Lopressor ) two hours prior to test. If you take Furosemide/Hydrochlorothiazide/Spironolactone/Chlorthalidone, please HOLD on the morning of the test. Patients who wear a continuous glucose monitor MUST  remove the device prior to scanning.       After the Test: Drink plenty of water. After receiving IV contrast, you may experience a mild flushed feeling. This is normal. On occasion, you may experience a mild rash up to 24 hours after the test. This is not dangerous. If this occurs, you can take Benadryl 25 mg, Zyrtec, Claritin, or Allegra and increase your fluid intake. (Patients taking Tikosyn should avoid Benadryl, and may take Zyrtec, Claritin, or Allegra) If you experience trouble breathing, this can be serious. If it is severe call 911 IMMEDIATELY. If it is mild, please call our office.  We will call to schedule your test 2-4 weeks out understanding that some insurance companies will need an authorization prior to the service being performed.   For more information and frequently asked questions, please visit our website : [email or web address]  For non-scheduling related questions, please contact the cardiac imaging nurse navigator should you have any questions/concerns: Cardiac Imaging Nurse Navigators Direct Office Dial: [phone]   For scheduling needs, including cancellations and rescheduling, please call [place], [phone].   Your next appointment:   6 month(s)   Provider:   Jerel Balding, MD     Follow-Up: At [facility], you and your health needs are our priority.  As part of our continuing mission to provide you with exceptional heart care, we have created designated Provider Care Teams.  These Care Teams include your primary Cardiologist (physician) and Advanced Practice Providers (APPs -  Physician Assistants and Nurse Practitioners) who all work together to provide you with the care you need, when you need it. We recommend signing up for the patient portal called MyChart.  Sign up information is provided on this After Visit Summary.  MyChart is used to connect with patients for Virtual Visits (Telemedicine).  Patients are able to view  lab/test results, encounter notes, upcoming appointments, etc.  Non-urgent messages can be sent to your provider as well.   To learn more about what you can do with MyChart, go to [email or web address].

## 2024-02-05 ENCOUNTER — Ambulatory Visit: Payer: Self-pay | Admitting: Physician Assistant

## 2024-02-05 LAB — BASIC METABOLIC PANEL WITH GFR
BUN/Creatinine Ratio: 22 (ref 10–24)
BUN: 19 mg/dL (ref 8–27)
CO2: 18 mmol/L — ABNORMAL LOW (ref 20–29)
Calcium: 9.5 mg/dL (ref 8.6–10.2)
Chloride: 101 mmol/L (ref 96–106)
Creatinine, Ser: 0.85 mg/dL (ref 0.76–1.27)
Glucose: 77 mg/dL (ref 70–99)
Potassium: 4.6 mmol/L (ref 3.5–5.2)
Sodium: 138 mmol/L (ref 134–144)
eGFR: 94 mL/min/1.73 (ref >59)

## 2024-02-10 ENCOUNTER — Encounter (HOSPITAL_COMMUNITY): Payer: Self-pay

## 2024-02-12 ENCOUNTER — Encounter: Payer: Self-pay | Admitting: Cardiovascular Disease

## 2024-02-12 ENCOUNTER — Ambulatory Visit (HOSPITAL_COMMUNITY)
Admission: RE | Admit: 2024-02-12 | Discharge: 2024-02-12 | Disposition: A | Discharge status: Home / Self Care | Source: Ambulatory Visit | Attending: Physician Assistant | Admitting: Physician Assistant

## 2024-02-12 DIAGNOSIS — I2583 Coronary atherosclerosis due to lipid rich plaque: Secondary | ICD-10-CM | POA: Diagnosis not present

## 2024-02-12 DIAGNOSIS — I2510 Atherosclerotic heart disease of native coronary artery without angina pectoris: Secondary | ICD-10-CM | POA: Insufficient documentation

## 2024-02-12 MED ORDER — IOHEXOL 350 MG/ML SOLN
100.0000 mL | Freq: Once | INTRAVENOUS | Status: AC | PRN
Start: 2024-02-12 — End: 2024-02-12
  Administered 2024-02-12: 100 mL via INTRAVENOUS

## 2024-02-12 MED ORDER — NITROGLYCERIN 0.4 MG SL SUBL
0.8000 mg | SUBLINGUAL_TABLET | Freq: Once | SUBLINGUAL | Status: AC
  Administered 2024-02-12: 0.8 mg via SUBLINGUAL

## 2024-02-12 NOTE — Telephone Encounter (Signed)
 Okay to switch from Eliquis  to rivaroxaban  20 mg daily and (if insurance covered) switch to the combined rosuvastatin -ezetimibe  40/10 mg pill, please

## 2024-02-16 DIAGNOSIS — F411 Generalized anxiety disorder: Secondary | ICD-10-CM | POA: Diagnosis not present

## 2024-02-16 DIAGNOSIS — H25811 Combined forms of age-related cataract, right eye: Secondary | ICD-10-CM | POA: Diagnosis not present

## 2024-02-16 DIAGNOSIS — I2510 Atherosclerotic heart disease of native coronary artery without angina pectoris: Secondary | ICD-10-CM | POA: Diagnosis not present

## 2024-02-16 DIAGNOSIS — H2511 Age-related nuclear cataract, right eye: Secondary | ICD-10-CM | POA: Diagnosis not present

## 2024-02-16 DIAGNOSIS — I4891 Unspecified atrial fibrillation: Secondary | ICD-10-CM | POA: Diagnosis not present

## 2024-02-16 DIAGNOSIS — E7800 Pure hypercholesterolemia, unspecified: Secondary | ICD-10-CM | POA: Diagnosis not present

## 2024-02-16 DIAGNOSIS — I10 Essential (primary) hypertension: Secondary | ICD-10-CM | POA: Diagnosis not present

## 2024-02-17 ENCOUNTER — Encounter: Payer: Self-pay | Admitting: Sports Medicine

## 2024-03-01 DIAGNOSIS — H25812 Combined forms of age-related cataract, left eye: Secondary | ICD-10-CM | POA: Diagnosis not present

## 2024-03-01 DIAGNOSIS — H52202 Unspecified astigmatism, left eye: Secondary | ICD-10-CM | POA: Diagnosis not present

## 2024-03-01 DIAGNOSIS — H25042 Posterior subcapsular polar age-related cataract, left eye: Secondary | ICD-10-CM | POA: Diagnosis not present

## 2024-03-01 DIAGNOSIS — H2512 Age-related nuclear cataract, left eye: Secondary | ICD-10-CM | POA: Diagnosis not present

## 2024-03-01 MED ORDER — EZETIMIBE-ROSUVASTATIN 10-40 MG PO TABS: 1.0000 | ORAL_TABLET | Freq: Every day | ORAL | 3 refills | Status: AC

## 2024-03-01 MED ORDER — RIVAROXABAN 10 MG PO TABS: 20.0000 mg | ORAL_TABLET | Freq: Every day | ORAL | 3 refills | Status: AC

## 2024-03-02 ENCOUNTER — Telehealth: Payer: Self-pay | Admitting: Pharmacy Technician

## 2024-03-02 ENCOUNTER — Other Ambulatory Visit (HOSPITAL_COMMUNITY): Payer: Self-pay

## 2024-03-02 NOTE — Telephone Encounter (Signed)
 Pharmacy Patient Advocate Encounter   Received notification from CoverMyMeds that prior authorization for Xarelto  is required/requested.   Insurance verification completed.   The patient is insured through [employer]   Per test claim: PA required; PA submitted to above mentioned insurance via Latent Key/confirmation #/EOC [id number] Status is pending

## 2024-03-03 ENCOUNTER — Other Ambulatory Visit (HOSPITAL_COMMUNITY): Payer: Self-pay

## 2024-03-03 DIAGNOSIS — I10 Essential (primary) hypertension: Secondary | ICD-10-CM | POA: Diagnosis not present

## 2024-03-03 DIAGNOSIS — I4891 Unspecified atrial fibrillation: Secondary | ICD-10-CM | POA: Diagnosis not present

## 2024-03-03 DIAGNOSIS — I2510 Atherosclerotic heart disease of native coronary artery without angina pectoris: Secondary | ICD-10-CM | POA: Diagnosis not present

## 2024-03-03 NOTE — Telephone Encounter (Signed)
 Pharmacy Patient Advocate Encounter  Received notification from [facility] that Prior Authorization for xarelto  10 mg has been APPROVED from 03/02/24 to 03/02/25. Unable to obtain price due to refill too soon rejection, last fill date 03/03/24 next available fill date12/23/25   PA #/Case ID/Reference #: [id number]

## 2024-03-09 ENCOUNTER — Ambulatory Visit: Admitting: Sports Medicine

## 2024-03-09 ENCOUNTER — Other Ambulatory Visit: Payer: Self-pay

## 2024-03-09 ENCOUNTER — Encounter: Payer: Self-pay | Admitting: Sports Medicine

## 2024-03-09 DIAGNOSIS — M79644 Pain in right finger(s): Secondary | ICD-10-CM

## 2024-03-09 DIAGNOSIS — G8929 Other chronic pain: Secondary | ICD-10-CM | POA: Diagnosis not present

## 2024-03-09 DIAGNOSIS — M19041 Primary osteoarthritis, right hand: Secondary | ICD-10-CM

## 2024-03-09 NOTE — Progress Notes (Signed)
 Patient says that he has had right thumb pain for months that has gotten progressively worse over time. He says that his pain comes and goes, and seems to be exacerbated when riding his bike and gripping the handle. He denies any popping or clicking, and also denies any swelling in the thumb. He uses both of his hands equally, and is not more dominant on one side or the other. He denies use of any home treatments.

## 2024-03-09 NOTE — Progress Notes (Signed)
 Michael Lambert - 68 y.o. male MRN [id number]  Date of birth: January 08, 1956  Office Visit Note: Visit Date: 03/09/2024 PCP: Ransom Other, MD Referred by: Ransom Other, MD  Subjective: Chief Complaint  Patient presents with   Right Hand - Pain   HPI: Michael Lambert is a pleasant 68 y.o. male who presents today for chronic right thumb pain.  He has had right thumb pain, pointing to the MCP region for the last few months that has gotten progressively worse.  His pain is not severe and is not stopping him from doing the things he wants to do but he wanted it evaluated to ensure he was not doing further damage.  He has had to adjust how he rides his bike in terms of changing gears using the wrist changer as opposed to the thumb gear change.  He denies any locking, clicking or catching.  No numbness or tingling.  He has not done any home treatments.  Occasionally he will infrequently take 2 Motrin  for this for various joint pains but does not take this consistently at all given his Eliquis  use.  Pertinent ROS were reviewed with the patient and found to be negative unless otherwise specified above in HPI.   Assessment & Plan: Visit Diagnoses:  1. 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 Chronic pain of right thumb    Plan: Impression is symptomatic right thumb pain with mild to moderate arthritic change at the MCP joint.  This becomes exacerbated with certain gripping motions and riding his bike/changing gears.  The nature of his arthritic change and overall pathophysiology of the condition.  This is not withholding him from doing activities he enjoys, fine to continue biking.  Did discuss grip change and adjustments to help offload the thumb joint.  I did allow him to try a cool comfort thumb brace today which she will wear with writing or other gripping activity - did not want to purchase this today, but could be something he uses going forward if feels necessary. He  may use topical Voltaren  gel or Tylenol  as needed when pain is more severe.  He will follow-up with me as needed.  Follow-up: Return if symptoms worsen or fail to improve.   Meds & Orders: No orders of the defined types were placed in this encounter.   Orders Placed This Encounter  Procedures   XR Finger Thumb Right     Procedures: No procedures performed      Clinical History: No specialty comments available.  He reports that he quit smoking about 43 years ago. His smoking use included cigarettes. He has never used smokeless tobacco. No results for input(s): HGBA1C, LABURIC in the last 8760 hours.  Objective:    Physical Exam  Gen: Well-appearing, in no acute distress; non-toxic CV: Well-perfused. Warm.  Resp: Breathing unlabored on room air; no wheezing. Psych: Fluid speech in conversation; appropriate affect; normal thought process  Ortho Exam - Right thumb: No redness swelling or effusion.  There is limitation in flexion about the MCP joint for this affect her right thumb compared to the left side which moves fluidly.  Negative CMC grind test.  Cap refill less than 2 seconds.  Imaging: XR Finger Thumb Right Result Date: 03/09/2024 Three-view x-ray of the right thumb including AP, lateral, oblique film were ordered and reviewed by myself today.  X-rays demonstrate mild to moderate MCP joint arthritic change but no high-grade arthropathy.  There is a small osteophyte  at the [address] joint without significant degenerative change.  No acute bony fracture noted.   Past Medical/Family/Surgical/Social History: Medications & Allergies reviewed per EMR, new medications updated. 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   Paroxysmal atrial fibrillation (HCC) 03/19/2017   Long term current use of anticoagulant 03/19/2017   PVC's (premature ventricular contractions) 03/19/2017   History of TIA (transient ischemic attack)    Migraine with aura 11/04/2016   Premature atrial contractions 05/29/2016   Left atrial dilatation 05/29/2016   Diarrhea 04/23/2016   Palpitations 01/28/2016   Anxiety 10/22/2011   URI (upper respiratory infection) 08/05/2010   HEMATOMA 02/26/2010   Hyperlipidemia 01/11/2007   GERD 01/11/2007   BENIGN PROSTATIC HYPERTROPHY 01/11/2007   HEADACHE 01/11/2007   Past Medical History:  Diagnosis Date   Anxiety    sees Dr. Slater Mam    Atrial fibrillation [facility])    on Xarelto  daily   Benign prostatic hypertrophy    GERD (gastroesophageal reflux disease)    Hyperlipidemia    Migraine    Stroke (HCC)    TIA memorial day 2018   Family History  Problem Relation Age of Onset   Multiple sclerosis Other        family hx   Hyperlipidemia Father    Ovarian cancer Mother    Colon cancer Neg Hx    Esophageal cancer Neg Hx    Rectal cancer Neg Hx    Stomach cancer Neg Hx    Colon polyps Neg Hx    Past Surgical History:  Procedure Laterality Date   APPENDECTOMY     ATRIAL FIBRILLATION ABLATION N/A 07/03/2020   Procedure: ATRIAL FIBRILLATION ABLATION;  Surgeon: Kelsie Agent, MD;  Location: [facility] INVASIVE CV LAB;  Service: Cardiovascular;  Laterality: N/A;   colonoscopy  10/08/2012   per Dr. Debrah, adenomatous polyps, repeat in 5 yrs    COLONOSCOPY     CYSTOSCOPY     per Dr. Nicholaus-    gum graft  08/2009   Dr Agent Bertin    HERNIA REPAIR  2008   bilateral inguinal, per Dr. Lily   implantable loop recorder removal  04/17/2021   MDT Reveal LINQ removed   LOOP RECORDER INSERTION N/A 11/18/2016   Procedure: Loop Recorder Insertion;  Surgeon:  Kelsie Agent, MD;  Location: [facility] INVASIVE CV LAB;  Service: Cardiovascular;  Laterality: N/A;   PALATE / UVULA BIOPSY / EXCISION  2004   per Dr. Ethyl, for snoring    RADIAL KERATOTOMY     sees Dr. Ismael Boll as FU  at [facility] , bilateral,  dr stonecipher did the RK    repair detached retina     TEE WITHOUT CARDIOVERSION N/A 11/18/2016   Procedure: TRANSESOPHAGEAL ECHOCARDIOGRAM (TEE);  Surgeon: Alveta Aleene PARAS, MD;  Location: [facility] ENDOSCOPY;  Service: Cardiovascular;  Laterality: N/A;   TONSILLECTOMY     Social History   Occupational History   Not on file  Tobacco Use   Smoking status: Former    Current packs/day: 0.00    Types: Cigarettes    Quit date: 05/19/1980    Years since quitting: 7.8  Smokeless tobacco: Never  Vaping Use   Vaping status: Never Used  Substance and Sexual Activity   Alcohol use: Yes    Alcohol/week: 2.0 standard drinks of alcohol    Types: 1 Glasses of wine, 1 Cans of beer per week    Comment: 2 glasses of wine each day   Drug use: No   Sexual activity: Not on file

## 2024-03-18 DIAGNOSIS — F411 Generalized anxiety disorder: Secondary | ICD-10-CM | POA: Diagnosis not present

## 2024-03-18 DIAGNOSIS — E7800 Pure hypercholesterolemia, unspecified: Secondary | ICD-10-CM | POA: Diagnosis not present

## 2024-03-18 DIAGNOSIS — I10 Essential (primary) hypertension: Secondary | ICD-10-CM | POA: Diagnosis not present

## 2024-03-18 DIAGNOSIS — I4891 Unspecified atrial fibrillation: Secondary | ICD-10-CM | POA: Diagnosis not present

## 2024-03-18 DIAGNOSIS — I2510 Atherosclerotic heart disease of native coronary artery without angina pectoris: Secondary | ICD-10-CM | POA: Diagnosis not present

## 2024-03-21 ENCOUNTER — Encounter: Payer: Self-pay | Admitting: Radiology

## 2024-04-02 DIAGNOSIS — I2510 Atherosclerotic heart disease of native coronary artery without angina pectoris: Secondary | ICD-10-CM | POA: Diagnosis not present

## 2024-04-02 DIAGNOSIS — I4891 Unspecified atrial fibrillation: Secondary | ICD-10-CM | POA: Diagnosis not present

## 2024-04-02 DIAGNOSIS — I10 Essential (primary) hypertension: Secondary | ICD-10-CM | POA: Diagnosis not present

## 2024-04-17 DIAGNOSIS — F411 Generalized anxiety disorder: Secondary | ICD-10-CM | POA: Diagnosis not present

## 2024-04-17 DIAGNOSIS — I2510 Atherosclerotic heart disease of native coronary artery without angina pectoris: Secondary | ICD-10-CM | POA: Diagnosis not present

## 2024-04-17 DIAGNOSIS — E7800 Pure hypercholesterolemia, unspecified: Secondary | ICD-10-CM | POA: Diagnosis not present

## 2024-04-17 DIAGNOSIS — I10 Essential (primary) hypertension: Secondary | ICD-10-CM | POA: Diagnosis not present

## 2024-04-17 DIAGNOSIS — I4891 Unspecified atrial fibrillation: Secondary | ICD-10-CM | POA: Diagnosis not present

## 2024-05-23 ENCOUNTER — Ambulatory Visit: Admitting: Cardiovascular Disease

## 2024-05-30 ENCOUNTER — Encounter: Payer: Self-pay | Admitting: Cardiovascular Disease

## 2024-06-17 ENCOUNTER — Telehealth: Payer: Self-pay

## 2024-06-17 ENCOUNTER — Encounter: Payer: Self-pay | Admitting: Cardiology

## 2024-06-17 ENCOUNTER — Ambulatory Visit: Attending: Cardiology | Admitting: Cardiology

## 2024-06-17 VITALS — BP 140/74 | HR 62 | Ht 69.0 in | Wt 175.0 lb

## 2024-06-17 DIAGNOSIS — I480 Paroxysmal atrial fibrillation: Secondary | ICD-10-CM | POA: Diagnosis not present

## 2024-06-17 DIAGNOSIS — G459 Transient cerebral ischemic attack, unspecified: Secondary | ICD-10-CM | POA: Diagnosis not present

## 2024-06-17 DIAGNOSIS — I2510 Atherosclerotic heart disease of native coronary artery without angina pectoris: Secondary | ICD-10-CM | POA: Diagnosis not present

## 2024-06-17 DIAGNOSIS — D6869 Other thrombophilia: Secondary | ICD-10-CM

## 2024-06-17 NOTE — Telephone Encounter (Signed)
 Per Dr. Kennyth, patient okay to proceed with LAAO. Request sent to [facility] for updated TruPlan with CT from 02/12/2024. Will coordinate date with patient.

## 2024-06-17 NOTE — Progress Notes (Signed)
 " Electrophysiology Office Note:   Date:  06/17/2024  ID:  Michael Lambert, DOB 11/25/1955, MRN [id number]  Primary Cardiologist: Jerel Balding, MD Electrophysiologist: Fonda Kitty, MD      History of Present Illness:   Michael Lambert is a 69 y.o. male with h/o paroxysmal atrial fibrillation s/p ablation and history of TIA, moderate CAD by coronary CT angiography who is being seen today for Watchman device evaluaiton.  Discussed the use of AI scribe software for clinical note transcription with the patient, who gave verbal consent to proceed.  History of Present Illness Michael Lambert is a 69 year old male with atrial fibrillation who presents for consideration of a Watchman device placement. He was referred by Dr. Cindie for follow-up regarding the Watchman device placement.  He has a history of atrial fibrillation and previously underwent an ablation procedure. He is currently on a blood thinner and is considering a Watchman device to potentially discontinue the blood thinner due to his active lifestyle and the desire to take NSAIDs.  He was close to having the Watchman device placed previously but was concerned about the recovery time due to his active lifestyle. He has a trabeculation in the left atrial appendage, as seen on a prior CT scan.  He is very active and has no issues with the blood thinner aside from the inability to take certain medications. He is interested in the Watchman device as it may allow him to discontinue the blood thinner.    Review of systems complete and found to be negative unless listed in HPI.   EP Information / Studies Reviewed:         Echo 10/13/23:   1. Left ventricular ejection fraction, by estimation, is 55 to 60%. The  left ventricle has normal function. The left ventricle has no regional  wall motion abnormalities. Left ventricular diastolic parameters are  indeterminate. The average left  ventricular global longitudinal strain is -15.4 %. The  global longitudinal  strain is abnormal.   2. Right ventricular systolic function is normal. The right ventricular  size is normal. There is normal pulmonary artery systolic pressure. The  estimated right ventricular systolic pressure is 24.9 mmHg.   3. The mitral valve is normal in structure. Trivial mitral valve  regurgitation. No evidence of mitral stenosis.   4. The aortic valve is tricuspid. There is mild calcification of the  aortic valve. Aortic valve regurgitation is not visualized. No aortic  stenosis is present.   5. The inferior vena cava is normal in size with greater than 50%  respiratory variability, suggesting right atrial pressure of 3 mmHg.   Risk Assessment/Calculations:    CHA2DS2-VASc Score = 5   This indicates a 7.2% annual risk of stroke. The patient's score is based upon: CHF History: 0 HTN History: 1 Diabetes History: 0 Stroke History: 2 Vascular Disease History: 1 Age Score: 1 Gender Score: 0          Physical Exam:   VS:  BP (!) 140/74 (BP Location: Left Arm, Patient Position: Sitting, Cuff Size: Normal)   Pulse 62   Ht 5' 9 (1.753 m)   Wt 175 lb (79.4 kg)   SpO2 95%   BMI 25.84 kg/m    Wt Readings from Last 3 Encounters:  06/17/24 175 lb (79.4 kg)  02/02/24 174 lb 9.6 oz (79.2 kg)  11/24/23 171 lb 3.2 oz (77.7 kg)    General: Well developed, in no acute distress.  Neck: No JVD.  Cardiac: Normal rate, regular rhythm.  Resp: Normal work of breathing.  Ext: No edema.  Neuro: No gross focal deficits.  Psych: Normal affect.   ASSESSMENT AND PLAN:   I have seen Michael Lambert in the office today who is being considered for a Watchman left atrial appendage closure device. I believe they will benefit from this procedure given their history of atrial fibrillation, CHA2DS2-VASc score of 5 and unadjusted ischemic stroke rate of 7.2% per year. Unfortunately, the patient is not felt to be a long term anticoagulation candidate secondary to use of  NSAIDS and active lifestyle with increased risk of bleeding. The patient's chart has been reviewed and I feel that they would be a candidate for short term oral anticoagulation after Watchman implant.   It is my belief that after undergoing a LAA closure procedure, Michael Lambert will not need long term anticoagulation which eliminates anticoagulation side effects and major bleeding risk.   Procedural risks for the Watchman implant have been reviewed with the patient including a 0.5% risk of stroke, <1% risk of perforation and <1% risk of device embolization. Other risks include bleeding, vascular damage, tamponade, worsening renal function, and death. The patient understands these risk and wishes to proceed.     The published clinical data on the safety and effectiveness of WATCHMAN include but are not limited to the following: - Holmes DR, Jess BEARD, Sick P et al. for the PROTECT AF Investigators. Percutaneous closure of the left atrial appendage versus warfarin therapy for prevention of stroke in patients with atrial fibrillation: a randomised non-inferiority trial. Lancet 2009; 374: 534-42. GLENWOOD Jess BEARD, Doshi SK, Jonita VEAR Satchel D et al. on behalf of the PROTECT AF Investigators. Percutaneous Left Atrial Appendage Closure for Stroke Prophylaxis in Patients With Atrial Fibrillation 2.3-Year Follow-up of the PROTECT AF (Watchman Left Atrial Appendage System for Embolic Protection in Patients With Atrial Fibrillation) Trial. Circulation 2013; 127:720-729. - Alli O, Doshi S,  Kar S, Reddy VY, Sievert H et al. Quality of Life Assessment in the Randomized PROTECT AF (Percutaneous Closure of the Left Atrial Appendage Versus Warfarin Therapy for Prevention of Stroke in Patients With Atrial Fibrillation) Trial of Patients at Risk for Stroke With Nonvalvular Atrial Fibrillation. J Am Coll Cardiol 2013; 61:1790-8. GLENWOOD Satchel DR, Archer RAMAN, Price M, Whisenant B, Sievert H, Doshi S, Huber K, Reddy V. Prospective  randomized evaluation of the Watchman left atrial appendage Device in patients with atrial fibrillation versus long-term warfarin therapy; the PREVAIL trial. Journal of the [employer] of Cardiology, Vol. 4, No. 1, 2014, 1-11. - Kar S, Doshi SK, Sadhu A, Horton R, Osorio J et al. Primary outcome evaluation of a next-generation left atrial appendage closure device: results from the PINNACLE FLX trial. Circulation 2021;143(18)1754-1762.   HAS-BLED score 4 Hypertension Yes  Abnormal renal and liver function (Dialysis, transplant, Cr >2.26 mg/dL /Cirrhosis or Bilirubin >2x Normal or AST/ALT/AP >3x Normal) No  Stroke Yes  Bleeding No  Labile INR (Unstable/high INR) No  Elderly (>65) Yes  Drugs or alcohol (>= 8 drinks/week, anti-plt or NSAID) Yes   CHA2DS2-VASc Score = 5  The patient's score is based upon: CHF History: 0 HTN History: 1 Diabetes History: 0 Stroke History: 2 Vascular Disease History: 1 Age Score: 1 Gender Score: 0       ASSESSMENT AND PLAN: Paroxysmal Atrial Fibrillation (ICD10:  I48.0) The patient's CHA2DS2-VASc score is 5, indicating a 7.2% annual risk of stroke.    Secondary Hypercoagulable State (ICD10:  I31.30) The patient is at significant risk for stroke/thromboembolism based upon his CHA2DS2-VASc Score of 5.  Continue Rivaroxaban  (Xarelto ).    Follow up with EP Team as usual post procedure  Signed, Fonda Kitty, MD  "

## 2024-06-17 NOTE — Telephone Encounter (Addendum)
 Updated [facility] report from CT 02/12/2024  Distal pectinate. Max 23/ AVG 20/ Depth 12.4 Likely use a 24mm or 27mm device Inf/Mid TSP RAO 25 CAU 25

## 2024-06-17 NOTE — Patient Instructions (Signed)
 Medication Instructions:  Your physician recommends that you continue on your current medications as directed. Please refer to the Current Medication list given to you today.  *If you need a refill on your cardiac medications before your next appointment, please call your pharmacy*  Testing/Procedures: Watchman  Your physician has requested that you have Left atrial appendage (LAA) closure device implantation is a procedure to put a small device in the LAA of the heart. The LAA is a small sac in the wall of the heart's left upper chamber. Blood clots can form in this area. The device, Watchman closes the LAA to help prevent a blood clot and stroke.  You will be contacted by Nurse Navigator, Danielle to schedule your pre-procedure visit and procedure date. If you have any questions she can be reached at [phone].   Follow-Up: At [facility], you and your health needs are our priority.  As part of our continuing mission to provide you with exceptional heart care, our providers are all part of one team.  This team includes your primary Cardiologist (physician) and Advanced Practice Providers or APPs (Physician Assistants and Nurse Practitioners) who all work together to provide you with the care you need, when you need it.

## 2024-06-22 ENCOUNTER — Other Ambulatory Visit: Payer: Self-pay

## 2024-06-22 DIAGNOSIS — I480 Paroxysmal atrial fibrillation: Secondary | ICD-10-CM

## 2024-06-22 NOTE — Telephone Encounter (Signed)
 Per Dr. Fonda Kitty - OK to proceed.

## 2024-06-22 NOTE — Telephone Encounter (Signed)
 Spoke with patient. Confirmed he would like to proceed with LAAO implant. Patient prefers to be on a date when he could be first case of the day. Arranged LAAO 08/11/2024 at 0730 (arrival of 0530). Patient will need labs prior.   Explained will send MyChart message with instructions.  Will call patient if have any sooner first case cancellation.

## 2024-06-22 NOTE — Addendum Note (Signed)
 Addended by: WAYLON EDSEL HERO on: 06/22/2024 01:03 PM   Modules accepted: Orders

## 2024-08-11 ENCOUNTER — Inpatient Hospital Stay (HOSPITAL_COMMUNITY): Admit: 2024-08-11 | Admitting: Cardiology

## 2024-08-11 ENCOUNTER — Encounter (HOSPITAL_COMMUNITY): Payer: Self-pay

## 2024-09-27 ENCOUNTER — Ambulatory Visit: Admitting: Pulmonary Disease

## 2024-10-11 ENCOUNTER — Ambulatory Visit: Admitting: Cardiovascular Disease
